# Patient Record
Sex: Female | Born: 1970
Health system: Southern US, Community
[De-identification: ages and names within clinical notes are randomized; demographics above are authoritative.]

## PROBLEM LIST (undated history)

## (undated) DIAGNOSIS — N200 Calculus of kidney: Secondary | ICD-10-CM

## (undated) DIAGNOSIS — C50919 Malignant neoplasm of unspecified site of unspecified female breast: Secondary | ICD-10-CM

## (undated) DIAGNOSIS — D0590 Unspecified type of carcinoma in situ of unspecified breast: Secondary | ICD-10-CM

## (undated) DIAGNOSIS — Z923 Personal history of irradiation: Secondary | ICD-10-CM

## (undated) DIAGNOSIS — T7840XA Allergy, unspecified, initial encounter: Secondary | ICD-10-CM

## (undated) DIAGNOSIS — M533 Sacrococcygeal disorders, not elsewhere classified: Secondary | ICD-10-CM

## (undated) DIAGNOSIS — Z9889 Other specified postprocedural states: Secondary | ICD-10-CM

## (undated) DIAGNOSIS — R112 Nausea with vomiting, unspecified: Secondary | ICD-10-CM

## (undated) HISTORY — PX: MOUTH SURGERY: SHX715

## (undated) HISTORY — PX: BREAST ENHANCEMENT SURGERY: SHX7

## (undated) HISTORY — DX: Malignant neoplasm of unspecified site of unspecified female breast: C50.919

## (undated) HISTORY — PX: FOOT SURGERY: SHX648

## (undated) HISTORY — PX: BREAST SURGERY: SHX581

## (undated) HISTORY — DX: Unspecified type of carcinoma in situ of unspecified breast: D05.90

## (undated) HISTORY — DX: Sacrococcygeal disorders, not elsewhere classified: M53.3

## (undated) HISTORY — PX: ORIF ANKLE FRACTURE: SUR919

## (undated) HISTORY — PX: OTHER SURGICAL HISTORY: SHX169

## (undated) HISTORY — PX: AUGMENTATION MAMMAPLASTY: SUR837

## (undated) SURGERY — APPENDECTOMY, LAPAROSCOPIC
Anesthesia: General

---

## 2002-09-05 ENCOUNTER — Other Ambulatory Visit: Admission: RE | Admit: 2002-09-05 | Discharge: 2002-09-05 | Payer: Self-pay | Admitting: Obstetrics and Gynecology

## 2003-09-18 ENCOUNTER — Other Ambulatory Visit: Admission: RE | Admit: 2003-09-18 | Discharge: 2003-09-18 | Payer: Self-pay | Admitting: Obstetrics and Gynecology

## 2004-10-23 ENCOUNTER — Other Ambulatory Visit: Admission: RE | Admit: 2004-10-23 | Discharge: 2004-10-23 | Payer: Self-pay | Admitting: Obstetrics and Gynecology

## 2005-03-09 ENCOUNTER — Emergency Department: Payer: Self-pay | Admitting: Emergency Medicine

## 2005-05-13 ENCOUNTER — Other Ambulatory Visit: Admission: RE | Admit: 2005-05-13 | Discharge: 2005-05-13 | Payer: Self-pay | Admitting: Obstetrics and Gynecology

## 2006-11-09 ENCOUNTER — Encounter: Admission: RE | Admit: 2006-11-09 | Discharge: 2006-11-09 | Payer: Self-pay | Admitting: Obstetrics and Gynecology

## 2007-04-07 ENCOUNTER — Observation Stay: Payer: Self-pay | Admitting: Unknown Physician Specialty

## 2007-07-21 ENCOUNTER — Encounter: Admission: RE | Admit: 2007-07-21 | Discharge: 2007-07-21 | Payer: Self-pay | Admitting: Obstetrics and Gynecology

## 2008-02-16 DIAGNOSIS — J309 Allergic rhinitis, unspecified: Secondary | ICD-10-CM | POA: Insufficient documentation

## 2008-12-26 ENCOUNTER — Inpatient Hospital Stay (HOSPITAL_COMMUNITY): Admission: AD | Admit: 2008-12-26 | Discharge: 2008-12-31 | Payer: Self-pay | Admitting: Obstetrics and Gynecology

## 2009-01-01 ENCOUNTER — Inpatient Hospital Stay: Payer: Self-pay | Admitting: Obstetrics and Gynecology

## 2009-01-31 ENCOUNTER — Ambulatory Visit (HOSPITAL_COMMUNITY): Admission: RE | Admit: 2009-01-31 | Discharge: 2009-01-31 | Payer: Self-pay | Admitting: Obstetrics and Gynecology

## 2009-10-01 ENCOUNTER — Ambulatory Visit (HOSPITAL_COMMUNITY): Admission: RE | Admit: 2009-10-01 | Discharge: 2009-10-01 | Payer: Self-pay | Admitting: Obstetrics and Gynecology

## 2011-03-10 LAB — CBC
HCT: 31.2 % — ABNORMAL LOW (ref 36.0–46.0)
HCT: 34.5 % — ABNORMAL LOW (ref 36.0–46.0)
Hemoglobin: 10.7 g/dL — ABNORMAL LOW (ref 12.0–15.0)
Hemoglobin: 11.7 g/dL — ABNORMAL LOW (ref 12.0–15.0)
MCHC: 33.9 g/dL (ref 30.0–36.0)
MCHC: 34.3 g/dL (ref 30.0–36.0)
MCV: 91.3 fL (ref 78.0–100.0)
Platelets: 247 10*3/uL (ref 150–400)
RBC: 3.38 MIL/uL — ABNORMAL LOW (ref 3.87–5.11)
RDW: 13.4 % (ref 11.5–15.5)
RDW: 13.7 % (ref 11.5–15.5)
RDW: 13.9 % (ref 11.5–15.5)

## 2011-03-10 LAB — MAGNESIUM: Magnesium: 6.4 mg/dL (ref 1.5–2.5)

## 2011-04-07 NOTE — Letter (Signed)
January 31, 2009    Duke Salvia. Marcelle Overlie, MD  [address] [place], [place] [address]   RE:   TALI, CLEAVES  MR#   [id number]  ACC#        [id number]   MFM CONSULTATION REPORT   Dear Dr. Marcelle Overlie:   I had the opportunity to see your patient, Kaylee Benson for a consultation  secondary to her history of a recent loss of a pregnancy at 22 weeks in  which she delivered at home.  As you know, Kaylee Benson is a 40 year old  gravida 4, para 2-1-1-2 Caucasian female who had a history of two  previous term pregnancies without complications prior to her recent  pregnancy loss at [redacted] weeks gestation.  Specifically, her past OB history  is the following.  In 1994, she had a vacuum-assisted vaginal delivery  at full term without complications.  In 1997, she had a spontaneous  vaginal delivery at full term, again without complications.  The father  of the baby of those pregnancies is different than the current father of  this most recent pregnancy.  The patient did not tell me about her third  pregnancy, but according to your records, she must have had an early  miscarriage or a pregnancy termination.  Fourth pregnancy was a  pregnancy which was ended at [redacted] weeks gestation.   PAST MEDICAL HISTORY:  Unremarkable.   PAST SURGICAL HISTORY:  Significant for a broken leg approximately 2  years ago which needed orthopedic surgery.   CURRENT MEDICATIONS:  Prenatal vitamins, calcium.   ALLERGIES:  None.   PHYSICAL EXAMINATION:  VITAL SIGNS:  Blood pressure 116/66, pulse was  76, weight 138.   HISTORY OF MOST RECENT PREGNANCY:  The patient relates that the most  recent pregnancy was uncomplicated of through approximately January 19.  At that time, she was noted to have small amount of spotting and was  seen in your office for an ultrasound which revealed no significant  problems.  She continued to have some vaginal bleeding; therefore, was  seen for several followup appointments one time of which  there was noted  to be evidence of a subchorionic hematoma on the ultrasound examination.  The patient was admitted to [facility] from February 3 to February  8 secondary to vaginal bleeding and some uterine contractions.  Uterine  contractions were treated with subcutaneous terbutaline and also with IV  magnesium sulfate with good resolution of her contractions.  She was  discharged home to bedrest on December 31, 2008, at 22-4/7th weeks  gestation.  She also was given a prescription for Procardia to take 10  mg every 6 hours.  The patient reports after having gone home and being  at bedrest that she had a fairly restless night with crampy abdominal  pain throughout the night.  She woke at approximately 5 a.m. and the  following morning (January 01, 2009) at which time she noted gross  rupture of membranes.  She subsequently got up to go to the bathroom and  with minimal amount of pain or cramping spontaneously delivered the baby  while in the toilet.  She reports that the baby was live born and had  spontaneous movement and showed signs of life at the time of delivery.  She also noted that she quickly past the placenta without complications  shortly after delivering the baby.  She was taken then to [facility] where she was evaluated for the home birth  at [redacted]  weeks gestation.  Placental pathology which was obtained at [facility] revealed acute chorioamnionitis and deciduitis.  There was no  appendicitis seen.  In addition to the placental pathology, she has also  undergone a saline ultrasound to rule out structural defects in your  office.  This apparently was normal except for showing some buildup  posteriorly of endometrial tissue and has had some irregular bleeding  since the delivery.  As I understand the plan is to repeat the saline  ultrasound in the future and possibly to do a D&C if needed.   Finally, she had a thrombophilia workup which  revealed the following:  Lupus anticoagulant was not detectable, beta 2 glycoprotein was normal,  anticardiolipin IgG was 13 which is in the indeterminate range,  anticardiolipin IgM antibody was 28, which is in the low radium positive  range, anticardiolipin IgA antibody was 22, which was in the positive  range.  Protein C and S were both normal.  She tested negative for the  prothrombin, to gene mutation, and for Leiden factor V mutation.   In summary, I believe her preterm delivery was most likely secondary to  a chronic abruption picture.  The thrombophilia workup including the  antiphospholipid antibody workup would not be consistent with  antiphospholipid syndrome, and her clinical picture is not really  consistent with antiphospholipid syndrome in that.  On last ultrasound,  fetal growth was normal and the baby was a live born infant of 22 weeks  and was not a 22-week demise, but in contrast was a live born delivery  at 69 weeks with neonatal demise.  In addition, the evidence of  chorioamnionitis points to a preterm labor/chronic abruption picture as  the cause of her delivery at [redacted] weeks gestation.  It is of note;  however, is that by report, she had minimal cramping overnight and no  significant laboring symptoms prior to delivery of the baby which could  point to an incompetent cervix picture.  I did review her ultrasound  examinations which were in the eyesight system and did not see any  evidence of incompetent cervix on her ultrasound pictures obtained prior  to delivery.  This however does not necessarily exclude some incompetent  cervix component to her delivery.  I discussed all of these opinions  with [facility] and her husband at the time of her consultation.   Based on the above, I would recommend the following for future  pregnancies:  1. Serial ultrasounds for evaluation of cervical length beginning at      approximately 14-15 weeks and conducted weekly until  approximately      [redacted] weeks gestation.  If there is in fact evidence of significant      funneling at the level of the internal os, I would recommend a      cerclage based upon her history which was reported.  2. Based upon her laboratory criteria, I would not treat her with      aspirin and heparin in any future pregnancies.  3. Based upon her preterm labor presentation, I would however offer      her 17-hydroxyprogesterone therapy in any future pregnancies      secondary to her history of a preterm delivery at [redacted] weeks      gestation.   I discussed with her that the recurrence risk in subsequent pregnancies  I believe would be quite low; however, in cases of abruption would  probably be quite well.  I discussed the above management strategies  with her and she seem to be amenable with those suggestions.  If you  have any comments or wish to discuss her case further with me, please  feel free to contact me.  If she decides to have another pregnancy and  you would like [place] to co-manager her pregnancy, we will be more than  happy to do that.   Sincerely,      Felipa Eth, MD      DCM/MEDQ  D:  01/31/2009  T:  02/01/2009  Job:  [phone] [id number]

## 2011-04-07 NOTE — Discharge Summary (Signed)
NAMELEONILDA, Kaylee Benson                  ACCOUNT NO.:  [id number]   MEDICAL RECORD NO.:  [id number]          PATIENT TYPE:  INP   LOCATION:  9151                          FACILITY:  [facility]   PHYSICIAN:  Freddy Finner, M.D.   DATE OF BIRTH:  Apr 04, 1971   DATE OF ADMISSION:  12/26/2008  DATE OF DISCHARGE:  12/31/2008                               DISCHARGE SUMMARY   ADMITTING DIAGNOSES:  1. Intrauterine pregnancy at 39 weeks' estimated gestational age.  2. Vaginal bleeding.   DISCHARGE DIAGNOSES:  1. Intrauterine pregnancy at 22-4/7th weeks' estimated gestational      age.  2. Vaginal bleeding, resolved.   REASON FOR ADMISSION:  Please see written H&P.   HOSPITAL COURSE:  The patient is 40 year old gravida 4, para 2 that was  admitted to [facility] at 22 weeks' estimated  gestational age with complaints of vaginal bleeding.  The patient had  been seen in the office on the week prior to admission with noted  placental abruption by ultrasound and had been placed on bedrest at  home.  The patient denied any loss of fluid.  Fetus was active.  The  patient had noted some increase in vaginal bleeding on the night prior  to admission and now presented to [facility] for further evaluation.  Contractions were approximately every 5 minutes on admission.  The  patient had been given subcu terbutaline with resolution of the  contractions.  The patient was now admitted for observation.  The  patient did undergo a vaginal ultrasound, which had revealed baby in the  53rd percentile weighing estimated fetal weight of 477 g.  Amniotic  fluid index was normal.  Cervix was noted to be 4.6 cm in length and  there was a 3.3-cm complex fluid collection at the internal os.  The  patient was now admitted and placed on bedrest for close observation and  repeat CBC ordered for the following morning.  Following morning, the  patient was continued to have some bleeding with contractions.  Vital  signs were  stable.  Uterus was soft, nontender.  Fetal heart tones were  positive.  The patient was started on some magnesium sulfate and CBC was  ordered.  The patient did have resolution of cramping and bleeding did  resolve and the patient was weaned off the magnesium sulfate and started  on Procardia.  On the day of discharge, ultrasound was repeated with  resolution of previous clot.  Cervix was noted to be 3.2 cm in length.  No evidence of placental abruption was noted.  The patient was  subsequently discharged home on bedrest.  The patient was instructed to  call for increase in vaginal pressure, contractions, decrease in fetal  movement, vaginal bleeding, or loss of amniotic fluid.   DISCHARGE MEDICATIONS:  1. Procardia 10 mg 1 p.o. every 6 hours.  2. Prenatal vitamins 1 p.o. daily.      Julio Sicks, N.P.      Freddy Finner, M.D.  Electronically Signed    CC/MEDQ  D:  01/18/2009  T:  01/18/2009  Job:  [id number]

## 2011-04-25 ENCOUNTER — Emergency Department: Payer: Self-pay | Admitting: Emergency Medicine

## 2011-12-01 ENCOUNTER — Observation Stay: Payer: Self-pay

## 2011-12-02 ENCOUNTER — Inpatient Hospital Stay: Payer: Self-pay | Admitting: Obstetrics & Gynecology

## 2011-12-02 LAB — CBC WITH DIFFERENTIAL/PLATELET
Eosinophil %: 0.9 %
HCT: 35.3 % (ref 35.0–47.0)
Lymphocyte #: 1.2 10*3/uL (ref 1.0–3.6)
MCH: 31.9 pg (ref 26.0–34.0)
MCV: 95 fL (ref 80–100)
Monocyte #: 0.5 10*3/uL (ref 0.0–0.7)
Neutrophil #: 9 10*3/uL — ABNORMAL HIGH (ref 1.4–6.5)
Neutrophil %: 83 %
Platelet: 157 10*3/uL (ref 150–440)
RBC: 3.73 10*6/uL — ABNORMAL LOW (ref 3.80–5.20)
RDW: 14.2 % (ref 11.5–14.5)
WBC: 10.8 10*3/uL (ref 3.6–11.0)

## 2011-12-03 LAB — HEMATOCRIT: HCT: 31 % — ABNORMAL LOW (ref 35.0–47.0)

## 2012-06-23 ENCOUNTER — Other Ambulatory Visit (HOSPITAL_COMMUNITY): Payer: Self-pay | Admitting: Obstetrics and Gynecology

## 2012-06-23 DIAGNOSIS — N971 Female infertility of tubal origin: Secondary | ICD-10-CM

## 2012-07-12 ENCOUNTER — Ambulatory Visit (HOSPITAL_COMMUNITY)
Admission: RE | Admit: 2012-07-12 | Discharge: 2012-07-12 | Disposition: A | Discharge status: Home / Self Care | Payer: BC Managed Care – PPO | Source: Ambulatory Visit | Attending: Obstetrics and Gynecology | Admitting: Obstetrics and Gynecology

## 2012-07-12 DIAGNOSIS — N971 Female infertility of tubal origin: Secondary | ICD-10-CM

## 2012-07-12 DIAGNOSIS — Z3049 Encounter for surveillance of other contraceptives: Secondary | ICD-10-CM | POA: Insufficient documentation

## 2012-07-12 MED ORDER — IOHEXOL 300 MG/ML  SOLN: 4.0000 mL | Freq: Once | INTRAMUSCULAR | Status: AC | PRN

## 2014-11-14 ENCOUNTER — Other Ambulatory Visit: Payer: Self-pay | Admitting: Obstetrics and Gynecology

## 2014-11-14 DIAGNOSIS — R928 Other abnormal and inconclusive findings on diagnostic imaging of breast: Secondary | ICD-10-CM

## 2014-11-22 ENCOUNTER — Ambulatory Visit
Admission: RE | Admit: 2014-11-22 | Discharge: 2014-11-22 | Disposition: A | Discharge status: Home / Self Care | Payer: BC Managed Care – PPO | Source: Ambulatory Visit | Attending: Obstetrics and Gynecology | Admitting: Obstetrics and Gynecology

## 2014-11-22 ENCOUNTER — Other Ambulatory Visit: Payer: Self-pay | Admitting: Obstetrics and Gynecology

## 2014-11-22 DIAGNOSIS — R928 Other abnormal and inconclusive findings on diagnostic imaging of breast: Secondary | ICD-10-CM

## 2014-11-23 DIAGNOSIS — Z923 Personal history of irradiation: Secondary | ICD-10-CM

## 2014-11-23 HISTORY — DX: Personal history of irradiation: Z92.3

## 2014-11-23 HISTORY — PX: BREAST LUMPECTOMY: SHX2

## 2014-11-23 HISTORY — PX: BREAST BIOPSY: SHX20

## 2014-11-29 ENCOUNTER — Other Ambulatory Visit: Payer: Self-pay | Admitting: Obstetrics and Gynecology

## 2014-11-29 DIAGNOSIS — R928 Other abnormal and inconclusive findings on diagnostic imaging of breast: Secondary | ICD-10-CM

## 2014-11-30 ENCOUNTER — Other Ambulatory Visit: Payer: BC Managed Care – PPO

## 2014-12-05 ENCOUNTER — Ambulatory Visit
Admission: RE | Admit: 2014-12-05 | Discharge: 2014-12-05 | Disposition: A | Discharge status: Home / Self Care | Payer: 59 | Source: Ambulatory Visit | Attending: Obstetrics and Gynecology | Admitting: Obstetrics and Gynecology

## 2014-12-05 DIAGNOSIS — R928 Other abnormal and inconclusive findings on diagnostic imaging of breast: Secondary | ICD-10-CM

## 2014-12-07 ENCOUNTER — Other Ambulatory Visit: Payer: Self-pay | Admitting: Obstetrics and Gynecology

## 2014-12-07 DIAGNOSIS — N63 Unspecified lump in unspecified breast: Secondary | ICD-10-CM

## 2014-12-11 ENCOUNTER — Ambulatory Visit
Admission: RE | Admit: 2014-12-11 | Discharge: 2014-12-11 | Disposition: A | Discharge status: Home / Self Care | Payer: 59 | Source: Ambulatory Visit | Attending: Obstetrics and Gynecology | Admitting: Obstetrics and Gynecology

## 2014-12-11 ENCOUNTER — Other Ambulatory Visit: Payer: Self-pay | Admitting: Obstetrics and Gynecology

## 2014-12-11 DIAGNOSIS — T8543XA Leakage of breast prosthesis and implant, initial encounter: Secondary | ICD-10-CM

## 2014-12-13 ENCOUNTER — Other Ambulatory Visit: Payer: Self-pay | Admitting: Obstetrics and Gynecology

## 2014-12-13 DIAGNOSIS — N63 Unspecified lump in unspecified breast: Secondary | ICD-10-CM

## 2014-12-21 ENCOUNTER — Ambulatory Visit
Admission: RE | Admit: 2014-12-21 | Discharge: 2014-12-21 | Disposition: A | Discharge status: Home / Self Care | Payer: 59 | Source: Ambulatory Visit | Attending: Obstetrics and Gynecology | Admitting: Obstetrics and Gynecology

## 2014-12-21 DIAGNOSIS — N63 Unspecified lump in unspecified breast: Secondary | ICD-10-CM

## 2014-12-24 ENCOUNTER — Ambulatory Visit (INDEPENDENT_AMBULATORY_CARE_PROVIDER_SITE_OTHER): Payer: Self-pay | Admitting: Surgery

## 2014-12-24 NOTE — H&P (Signed)
Kaylee Benson 12/24/2014 2:25 PM Location: [facility] Patient #: [id number] DOB: Sep 17, 1971 Married / Language: English / Race: White Female History of Present Illness Kaylee Moores A. Deyci Gesell MD; 12/24/2014 3:16 PM) Patient words: Right Breast consult  Pt sent at the request of Dr [name] for right breast mass core bx shown to be ADH. Pt has implants and right sided implant ruptured. This will need to be replaced by Dr [name]. Denies any mass or pain prior to this. Picked up on screening mammogram. CLINICAL DATA: 44 year old female, callback from screening mammogram for possible right breast mass EXAM: DIGITAL DIAGNOSTIC RIGHT MAMMOGRAM ULTRASOUND RIGHT BREAST COMPARISON: 11/08/2014, 09/04/2013, additional prior imaging dating back to 12/26/2010 ACR Breast Density Category c: The breast tissue is heterogeneously dense, which may obscure small masses. FINDINGS: The possible mass seen on the MLO view only of the screening mammogram is not well seen on today's MLO spot-compression view. On the tomosynthesis MLO view from the screening mammogram, this mass appeared to be within the lateral breast. On today's spot compression CC view, an oval low-density mass with obscured margins is seen within the lateral right breast, likely corresponding to the abnormality seen on the MLO view of the screening mammogram. On physical exam, no discrete mass is felt within the area of concern in the lateral right breast. Targeted ultrasound of the lateral right breast demonstrates a circumscribed oval hypoechoic mass measuring 4 x 2 x 5 mm at 9 o'clock, 4 cm from the nipple. A small amount of internal vascularity is noted within this probable complicated cyst. IMPRESSION: Indeterminate right breast mass. RECOMMENDATION: Ultrasound-guided right breast biopsy. I have discussed the findings and recommendations with the patient. Results were also provided in writing at the conclusion of the visit. If  applicable, a reminder letter will be sent to the patient regarding the next appointment. BI-RADS CATEGORY 4: Suspicious. Electronically Signed By: Pamelia Hoit M.D. On: 11/22/2014 13:01   External Result Report         Breast, left, needle core biopsy, mass, 9 o'clock - ATYPICAL DUCTAL HYPERPLASIA. - SEE COMMENT. Microscopic Comment.  The patient is a 44 year old female   Other Problems ([name], RN, BSN; 12/24/2014 2:25 PM) Back Pain Kidney Edythe Clarity In Breast  Past Surgical History [name], RN, BSN; 12/24/2014 2:25 PM) Breast Augmentation Bilateral. Breast Biopsy Right. Foot Surgery Left. Oral Surgery  Diagnostic Studies History (Crystal Goodhue, RN, BSN; 12/24/2014 2:25 PM) Colonoscopy never Mammogram within last year Pap Smear 1-5 years ago  Allergies [name], RN, BSN; 12/24/2014 2:26 PM) Seasonal IC *ASSORTED CLASSES*  Medication History [name], RN, BSN; 12/24/2014 2:27 PM) Calcium Citrate (200MG  Tablet, 1 (one) Oral daily) Active. Multiple Vitamin (1 (one) Oral daily) Active.  Social History Multimedia programmer, RN, BSN; 12/24/2014 2:25 PM) Alcohol use Occasional alcohol use. Caffeine use Coffee, Tea. No drug use Tobacco use Never smoker.  Family History ([name], RN, BSN; 12/24/2014 2:25 PM) Breast Cancer Mother. Heart Disease Mother. Heart disease in female family member before age 40 Hypertension Mother.  Pregnancy / Birth History ([name], RN, BSN; 12/24/2014 2:25 PM) Age at menarche 18 years. Contraceptive History Oral contraceptives. Gravida 4 Maternal age 44-25 Para 3 Regular periods     Review of Systems Occupational hygienist, BSN; 12/24/2014 2:25 PM) General Not Present- Appetite Loss, Chills, Fatigue, Fever, Night Sweats, Weight Gain and Weight Loss. Skin Not Present- Change in Wart/Mole, Dryness, Hives, Jaundice, New Lesions, Non-Healing Wounds, Rash and  Ulcer. HEENT  Present- Seasonal Allergies and Wears glasses/contact lenses. Not Present- Earache, Hearing Loss, Hoarseness, Nose Bleed, Oral Ulcers, Ringing in the Ears, Sinus Pain, Sore Throat, Visual Disturbances and Yellow Eyes. Respiratory Not Present- Bloody sputum, Chronic Cough, Difficulty Breathing, Snoring and Wheezing. Breast Present- Breast Mass. Not Present- Breast Pain, Nipple Discharge and Skin Changes. Cardiovascular Not Present- Chest Pain, Difficulty Breathing Lying Down, Leg Cramps, Palpitations, Rapid Heart Rate, Shortness of Breath and Swelling of Extremities. Gastrointestinal Not Present- Abdominal Pain, Bloating, Bloody Stool, Change in Bowel Habits, Chronic diarrhea, Constipation, Difficulty Swallowing, Excessive gas, Gets full quickly at meals, Hemorrhoids, Indigestion, Nausea, Rectal Pain and Vomiting. Female Genitourinary Not Present- Frequency, Nocturia, Painful Urination, Pelvic Pain and Urgency. Musculoskeletal Not Present- Back Pain, Joint Pain, Joint Stiffness, Muscle Pain, Muscle Weakness and Swelling of Extremities. Neurological Not Present- Decreased Memory, Fainting, Headaches, Numbness, Seizures, Tingling, Tremor, Trouble walking and Weakness. Psychiatric Not Present- Anxiety, Bipolar, Change in Sleep Pattern, Depression, Fearful and Frequent crying. Endocrine Not Present- Cold Intolerance, Excessive Hunger, Hair Changes, Heat Intolerance, Hot flashes and New Diabetes. Hematology Not Present- Easy Bruising, Excessive bleeding, Gland problems, HIV and Persistent Infections.  Vitals Occupational hygienist, BSN; 12/24/2014 2:29 PM) 12/24/2014 2:27 PM Weight: 124.2 lb Height: 60in Body Surface Area: 1.54 m Body Mass Index: 24.26 kg/m Temp.: 98.61F(Oral)  Pulse: 78 (Regular)  Resp.: 20 (Unlabored)  BP: 100/70 (Sitting, Left Arm, Standard)     Physical Exam (Tayllor Breitenstein A. Anyla Israelson MD; 12/24/2014 3:16 PM)  General Mental Status-Alert. General  Appearance-Consistent with stated age. Hydration-Well hydrated. Voice-Normal.  Head and Neck Head-normocephalic, atraumatic with no lesions or palpable masses. Trachea-midline. Thyroid Gland Characteristics - normal size and consistency.  Eye Eyeball - Bilateral-Extraocular movements intact. Sclera/Conjunctiva - Bilateral-No scleral icterus.  Chest and Lung Exam Chest and lung exam reveals -quiet, even and easy respiratory effort with no use of accessory muscles and on auscultation, normal breath sounds, no adventitious sounds and normal vocal resonance. Inspection Chest Wall - Normal. Back - normal.  Breast Breast - Left-Symmetric, Non Tender, No Biopsy scars, no Dimpling, No Inflammation, No Lumpectomy scars, No Mastectomy scars, No Peau d' Orange. Breast - Right-Symmetric, Non Tender, No Biopsy scars, no Dimpling, No Inflammation, No Lumpectomy scars, No Mastectomy scars, No Peau d' Orange. Breast Lump-No Palpable Breast Mass. Note: implants in place. mild bruising on the right   Cardiovascular Cardiovascular examination reveals -normal heart sounds, regular rate and rhythm with no murmurs and normal pedal pulses bilaterally.  Abdomen Inspection Inspection of the abdomen reveals - No Hernias. Skin - Scar - no surgical scars. Palpation/Percussion Palpation and Percussion of the abdomen reveal - Soft, Non Tender, No Rebound tenderness, No Rigidity (guarding) and No hepatosplenomegaly. Auscultation Auscultation of the abdomen reveals - Bowel sounds normal.  Neurologic Neurologic evaluation reveals -alert and oriented x 3 with no impairment of recent or remote memory. Mental Status-Normal.  Musculoskeletal Normal Exam - Left-Upper Extremity Strength Normal and Lower Extremity Strength Normal. Normal Exam - Right-Upper Extremity Strength Normal and Lower Extremity Strength Normal.  Lymphatic Head & Neck  General Head & Neck Lymphatics:  Bilateral - Description - Normal. Axillary  General Axillary Region: Bilateral - Description - Normal. Tenderness - Non Tender. Femoral & Inguinal  Generalized Femoral & Inguinal Lymphatics: Bilateral - Description - Normal. Tenderness - Non Tender.    Assessment & Plan (Mackinley Cassaday A. Sparsh Callens MD; 12/24/2014 3:18 PM)  ATYPICAL DUCTAL HYPERPLASIA OF RIGHT BREAST (610.8  N60.91) Impression: recommend lumpectomy on right Risk of lumpectomy include bleeding, infection,  seroma, more surgery, use of seed/wire, wound care, cosmetic deformity and the need for other treatments, death , blood clots, death. Pt agrees to proceed. with seed or wire depending where Dr [name] works.  Current Plans Pt Education - [facility] Breast Biopsy [facility] BREAST IMPLANT LEAK (996.54  T85.43XA) Impression: per Dr [name] try to coordinate lumpectomy and implant exchange

## 2015-01-04 ENCOUNTER — Ambulatory Visit (INDEPENDENT_AMBULATORY_CARE_PROVIDER_SITE_OTHER): Payer: Self-pay | Admitting: Surgery

## 2015-01-04 DIAGNOSIS — N6310 Unspecified lump in the right breast, unspecified quadrant: Secondary | ICD-10-CM

## 2015-01-08 ENCOUNTER — Other Ambulatory Visit (INDEPENDENT_AMBULATORY_CARE_PROVIDER_SITE_OTHER): Payer: Self-pay | Admitting: Surgery

## 2015-01-08 DIAGNOSIS — N6310 Unspecified lump in the right breast, unspecified quadrant: Secondary | ICD-10-CM

## 2015-01-25 ENCOUNTER — Encounter (HOSPITAL_BASED_OUTPATIENT_CLINIC_OR_DEPARTMENT_OTHER): Payer: Self-pay | Admitting: *Deleted

## 2015-01-25 NOTE — Progress Notes (Signed)
Pt coming in Monday for CMET, CBC DIFF.

## 2015-01-28 ENCOUNTER — Encounter (HOSPITAL_BASED_OUTPATIENT_CLINIC_OR_DEPARTMENT_OTHER)
Admission: RE | Admit: 2015-01-28 | Discharge: 2015-01-28 | Disposition: A | Discharge status: Home / Self Care | Payer: 59 | Source: Ambulatory Visit | Attending: Surgery | Admitting: Surgery

## 2015-01-28 DIAGNOSIS — N6011 Diffuse cystic mastopathy of right breast: Secondary | ICD-10-CM | POA: Diagnosis not present

## 2015-01-28 DIAGNOSIS — Z9882 Breast implant status: Secondary | ICD-10-CM | POA: Diagnosis not present

## 2015-01-28 DIAGNOSIS — Z87442 Personal history of urinary calculi: Secondary | ICD-10-CM | POA: Diagnosis not present

## 2015-01-28 DIAGNOSIS — D0511 Intraductal carcinoma in situ of right breast: Secondary | ICD-10-CM | POA: Diagnosis not present

## 2015-01-28 DIAGNOSIS — N63 Unspecified lump in breast: Secondary | ICD-10-CM | POA: Diagnosis present

## 2015-01-28 DIAGNOSIS — Z803 Family history of malignant neoplasm of breast: Secondary | ICD-10-CM | POA: Diagnosis not present

## 2015-01-28 LAB — COMPREHENSIVE METABOLIC PANEL
ALK PHOS: 50 U/L (ref 39–117)
ALT: 17 U/L (ref 0–35)
AST: 26 U/L (ref 0–37)
Albumin: 4.1 g/dL (ref 3.5–5.2)
Anion gap: 6 (ref 5–15)
BILIRUBIN TOTAL: 0.4 mg/dL (ref 0.3–1.2)
BUN: 19 mg/dL (ref 6–23)
CO2: 27 mmol/L (ref 19–32)
CREATININE: 0.78 mg/dL (ref 0.50–1.10)
Calcium: 9.3 mg/dL (ref 8.4–10.5)
Chloride: 104 mmol/L (ref 96–112)
Glucose, Bld: 67 mg/dL — ABNORMAL LOW (ref 70–99)
Potassium: 3.8 mmol/L (ref 3.5–5.1)
Sodium: 137 mmol/L (ref 135–145)
Total Protein: 7.2 g/dL (ref 6.0–8.3)

## 2015-01-28 LAB — CBC WITH DIFFERENTIAL/PLATELET
Basophils Absolute: 0 10*3/uL (ref 0.0–0.1)
Basophils Relative: 0 % (ref 0–1)
Eosinophils Absolute: 0.1 10*3/uL (ref 0.0–0.7)
Eosinophils Relative: 1 % (ref 0–5)
HCT: 39.2 % (ref 36.0–46.0)
HEMOGLOBIN: 13 g/dL (ref 12.0–15.0)
LYMPHS ABS: 1.3 10*3/uL (ref 0.7–4.0)
Lymphocytes Relative: 18 % (ref 12–46)
MCH: 29.4 pg (ref 26.0–34.0)
MCHC: 33.2 g/dL (ref 30.0–36.0)
MCV: 88.7 fL (ref 78.0–100.0)
MONO ABS: 0.5 10*3/uL (ref 0.1–1.0)
MONOS PCT: 7 % (ref 3–12)
NEUTROS ABS: 5.3 10*3/uL (ref 1.7–7.7)
Neutrophils Relative %: 74 % (ref 43–77)
Platelets: 316 10*3/uL (ref 150–400)
RBC: 4.42 MIL/uL (ref 3.87–5.11)
RDW: 14 % (ref 11.5–15.5)
WBC: 7.2 10*3/uL (ref 4.0–10.5)

## 2015-01-30 ENCOUNTER — Encounter (HOSPITAL_BASED_OUTPATIENT_CLINIC_OR_DEPARTMENT_OTHER)
Admission: RE | Disposition: A | Discharge status: Home / Self Care | Payer: Self-pay | Source: Ambulatory Visit | Attending: Surgery

## 2015-01-30 ENCOUNTER — Ambulatory Visit
Admission: RE | Admit: 2015-01-30 | Discharge: 2015-01-30 | Disposition: A | Discharge status: Home / Self Care | Payer: 59 | Source: Ambulatory Visit | Attending: Surgery | Admitting: Surgery

## 2015-01-30 ENCOUNTER — Ambulatory Visit (HOSPITAL_BASED_OUTPATIENT_CLINIC_OR_DEPARTMENT_OTHER)
Admission: RE | Admit: 2015-01-30 | Discharge: 2015-01-30 | Disposition: A | Discharge status: Home / Self Care | Payer: 59 | Source: Ambulatory Visit | Attending: Surgery | Admitting: Surgery

## 2015-01-30 ENCOUNTER — Encounter (HOSPITAL_BASED_OUTPATIENT_CLINIC_OR_DEPARTMENT_OTHER): Payer: Self-pay

## 2015-01-30 ENCOUNTER — Ambulatory Visit (HOSPITAL_BASED_OUTPATIENT_CLINIC_OR_DEPARTMENT_OTHER): Payer: 59 | Admitting: Anesthesiology

## 2015-01-30 DIAGNOSIS — D0511 Intraductal carcinoma in situ of right breast: Secondary | ICD-10-CM | POA: Insufficient documentation

## 2015-01-30 DIAGNOSIS — N6310 Unspecified lump in the right breast, unspecified quadrant: Secondary | ICD-10-CM

## 2015-01-30 DIAGNOSIS — Z87442 Personal history of urinary calculi: Secondary | ICD-10-CM | POA: Insufficient documentation

## 2015-01-30 DIAGNOSIS — N6011 Diffuse cystic mastopathy of right breast: Secondary | ICD-10-CM | POA: Insufficient documentation

## 2015-01-30 DIAGNOSIS — Z9882 Breast implant status: Secondary | ICD-10-CM | POA: Insufficient documentation

## 2015-01-30 DIAGNOSIS — Z803 Family history of malignant neoplasm of breast: Secondary | ICD-10-CM | POA: Insufficient documentation

## 2015-01-30 HISTORY — DX: Calculus of kidney: N20.0

## 2015-01-30 HISTORY — PX: BREAST LUMPECTOMY WITH NEEDLE LOCALIZATION: SHX5759

## 2015-01-30 HISTORY — PX: BREAST CAPSULECTOMY WITH IMPLANT EXCHANGE: SHX5592

## 2015-01-30 HISTORY — DX: Other specified postprocedural states: Z98.890

## 2015-01-30 HISTORY — DX: Other specified postprocedural states: R11.2

## 2015-01-30 LAB — POCT HEMOGLOBIN-HEMACUE: Hemoglobin: 14.2 g/dL (ref 12.0–15.0)

## 2015-01-30 SURGERY — BREAST LUMPECTOMY WITH NEEDLE LOCALIZATION
Anesthesia: General | Site: Breast | Laterality: Right

## 2015-01-30 MED ORDER — ROCURONIUM BROMIDE 100 MG/10ML IV SOLN
INTRAVENOUS | Status: DC | PRN
  Administered 2015-01-30: 30 mg via INTRAVENOUS

## 2015-01-30 MED ORDER — LIDOCAINE HCL (CARDIAC) 20 MG/ML IV SOLN
INTRAVENOUS | Status: DC | PRN
  Administered 2015-01-30: 50 mg via INTRAVENOUS

## 2015-01-30 MED ORDER — SODIUM CHLORIDE 0.9 % IR SOLN
Status: DC | PRN
  Administered 2015-01-30: 500 mL

## 2015-01-30 MED ORDER — EPHEDRINE SULFATE 50 MG/ML IJ SOLN
INTRAMUSCULAR | Status: DC | PRN
  Administered 2015-01-30: 10 mg via INTRAVENOUS

## 2015-01-30 MED ORDER — SCOPOLAMINE 1 MG/3DAYS TD PT72
MEDICATED_PATCH | TRANSDERMAL | Status: AC
  Filled 2015-01-30: qty 1

## 2015-01-30 MED ORDER — DEXTROSE 5 % IV SOLN
3.0000 g | INTRAVENOUS | Status: AC
  Administered 2015-01-30: 2 g via INTRAVENOUS

## 2015-01-30 MED ORDER — ONDANSETRON HCL 4 MG/2ML IJ SOLN
INTRAMUSCULAR | Status: DC | PRN
  Administered 2015-01-30: 4 mg via INTRAVENOUS

## 2015-01-30 MED ORDER — NEOSTIGMINE METHYLSULFATE 10 MG/10ML IV SOLN
INTRAVENOUS | Status: DC | PRN
  Administered 2015-01-30: 3 mg via INTRAVENOUS

## 2015-01-30 MED ORDER — FENTANYL CITRATE 0.05 MG/ML IJ SOLN
INTRAMUSCULAR | Status: DC | PRN
  Administered 2015-01-30: 25 ug via INTRAVENOUS
  Administered 2015-01-30: 100 ug via INTRAVENOUS

## 2015-01-30 MED ORDER — OXYCODONE HCL 5 MG PO TABS: 5.0000 mg | ORAL_TABLET | Freq: Once | ORAL | Status: DC | PRN

## 2015-01-30 MED ORDER — HYDROMORPHONE HCL 1 MG/ML IJ SOLN: 0.2500 mg | INTRAMUSCULAR | Status: DC | PRN

## 2015-01-30 MED ORDER — PROPOFOL 10 MG/ML IV BOLUS
INTRAVENOUS | Status: DC | PRN
  Administered 2015-01-30: 200 mg via INTRAVENOUS

## 2015-01-30 MED ORDER — SCOPOLAMINE 1 MG/3DAYS TD PT72
1.0000 | MEDICATED_PATCH | TRANSDERMAL | Status: DC
  Administered 2015-01-30: 1.5 mg via TRANSDERMAL

## 2015-01-30 MED ORDER — SUCCINYLCHOLINE CHLORIDE 20 MG/ML IJ SOLN
INTRAMUSCULAR | Status: DC | PRN
  Administered 2015-01-30: 100 mg via INTRAVENOUS

## 2015-01-30 MED ORDER — OXYCODONE-ACETAMINOPHEN 5-325 MG PO TABS
1.0000 | ORAL_TABLET | ORAL | Status: DC | PRN
Start: 2015-01-30 — End: 2015-09-04

## 2015-01-30 MED ORDER — DEXAMETHASONE SODIUM PHOSPHATE 4 MG/ML IJ SOLN
INTRAMUSCULAR | Status: DC | PRN
  Administered 2015-01-30: 10 mg via INTRAVENOUS

## 2015-01-30 MED ORDER — MIDAZOLAM HCL 2 MG/2ML IJ SOLN: 1.0000 mg | INTRAMUSCULAR | Status: DC | PRN

## 2015-01-30 MED ORDER — BUPIVACAINE-EPINEPHRINE 0.25% -1:200000 IJ SOLN
INTRAMUSCULAR | Status: DC | PRN
  Administered 2015-01-30: 12.5 mL

## 2015-01-30 MED ORDER — CHLORHEXIDINE GLUCONATE 4 % EX LIQD: 1.0000 "application " | Freq: Once | CUTANEOUS | Status: DC

## 2015-01-30 MED ORDER — FENTANYL CITRATE 0.05 MG/ML IJ SOLN
INTRAMUSCULAR | Status: AC
  Filled 2015-01-30: qty 6

## 2015-01-30 MED ORDER — GLYCOPYRROLATE 0.2 MG/ML IJ SOLN
INTRAMUSCULAR | Status: DC | PRN
  Administered 2015-01-30: 0.4 mg via INTRAVENOUS

## 2015-01-30 MED ORDER — OXYCODONE HCL 5 MG/5ML PO SOLN: 5.0000 mg | Freq: Once | ORAL | Status: DC | PRN

## 2015-01-30 MED ORDER — 0.9 % SODIUM CHLORIDE (POUR BTL) OPTIME
TOPICAL | Status: DC | PRN
  Administered 2015-01-30: 375 mL

## 2015-01-30 MED ORDER — LACTATED RINGERS IV SOLN
INTRAVENOUS | Status: DC
  Administered 2015-01-30 (×2): via INTRAVENOUS

## 2015-01-30 MED ORDER — CEFAZOLIN SODIUM-DEXTROSE 2-3 GM-% IV SOLR
INTRAVENOUS | Status: AC
  Filled 2015-01-30: qty 50

## 2015-01-30 MED ORDER — PROMETHAZINE HCL 25 MG/ML IJ SOLN
6.2500 mg | INTRAMUSCULAR | Status: DC | PRN
Start: 2015-01-30 — End: 2015-01-30

## 2015-01-30 MED ORDER — FENTANYL CITRATE 0.05 MG/ML IJ SOLN: 50.0000 ug | INTRAMUSCULAR | Status: DC | PRN

## 2015-01-30 MED ORDER — MIDAZOLAM HCL 2 MG/2ML IJ SOLN
INTRAMUSCULAR | Status: AC
  Filled 2015-01-30: qty 2

## 2015-01-30 MED ORDER — DEXTROSE 5 % IV SOLN: 3.0000 g | INTRAVENOUS | Status: DC

## 2015-01-30 MED ORDER — LIDOCAINE-EPINEPHRINE (PF) 1 %-1:200000 IJ SOLN
INTRAMUSCULAR | Status: AC
  Filled 2015-01-30: qty 10

## 2015-01-30 MED ORDER — MIDAZOLAM HCL 5 MG/5ML IJ SOLN
INTRAMUSCULAR | Status: DC | PRN
  Administered 2015-01-30: 2 mg via INTRAVENOUS

## 2015-01-30 MED ORDER — BUPIVACAINE-EPINEPHRINE (PF) 0.25% -1:200000 IJ SOLN
INTRAMUSCULAR | Status: AC
  Filled 2015-01-30: qty 30

## 2015-01-30 SURGICAL SUPPLY — 92 items
APPLIER CLIP 11 MED OPEN (CLIP)
APR CLP MED 11 20 MLT OPN (CLIP)
BAG DECANTER FOR FLEXI CONT (MISCELLANEOUS) ×2 IMPLANT
BANDAGE ELASTIC 6 VELCRO ST LF (GAUZE/BANDAGES/DRESSINGS) IMPLANT
BINDER BREAST LRG (GAUZE/BANDAGES/DRESSINGS) IMPLANT
BINDER BREAST MEDIUM (GAUZE/BANDAGES/DRESSINGS) ×1 IMPLANT
BINDER BREAST XLRG (GAUZE/BANDAGES/DRESSINGS) IMPLANT
BINDER BREAST XXLRG (GAUZE/BANDAGES/DRESSINGS) IMPLANT
BIOPATCH RED 1 DISK 7.0 (GAUZE/BANDAGES/DRESSINGS) IMPLANT
BLADE SURG 15 STRL LF DISP TIS (BLADE) ×2 IMPLANT
BLADE SURG 15 STRL SS (BLADE) ×4
BNDG GAUZE ELAST 4 BULKY (GAUZE/BANDAGES/DRESSINGS) IMPLANT
CANISTER SUCT 1200ML W/VALVE (MISCELLANEOUS) ×3 IMPLANT
CHLORAPREP W/TINT 26ML (MISCELLANEOUS) ×2 IMPLANT
CLIP APPLIE 11 MED OPEN (CLIP) IMPLANT
CLIP TI WIDE RED SMALL 6 (CLIP) IMPLANT
COVER BACK TABLE 60X90IN (DRAPES) ×3 IMPLANT
COVER MAYO STAND STRL (DRAPES) ×3 IMPLANT
DECANTER SPIKE VIAL GLASS SM (MISCELLANEOUS) ×2 IMPLANT
DEVICE DUBIN W/COMP PLATE 8390 (MISCELLANEOUS) ×1 IMPLANT
DRAIN CHANNEL 19F RND (DRAIN) IMPLANT
DRAPE LAPAROSCOPIC ABDOMINAL (DRAPES) ×2 IMPLANT
DRAPE LAPAROTOMY 100X72 PEDS (DRAPES) ×1 IMPLANT
DRAPE UTILITY XL STRL (DRAPES) ×2 IMPLANT
DRSG PAD ABDOMINAL 8X10 ST (GAUZE/BANDAGES/DRESSINGS) ×2 IMPLANT
ELECT BLADE 4.0 EZ CLEAN MEGAD (MISCELLANEOUS) ×2
ELECT BLADE 6.5 .24CM SHAFT (ELECTRODE) IMPLANT
ELECT COATED BLADE 2.86 ST (ELECTRODE) ×4 IMPLANT
ELECT REM PT RETURN 9FT ADLT (ELECTROSURGICAL) ×2
ELECTRODE BLDE 4.0 EZ CLN MEGD (MISCELLANEOUS) ×1 IMPLANT
ELECTRODE REM PT RTRN 9FT ADLT (ELECTROSURGICAL) ×2 IMPLANT
EVACUATOR SILICONE 100CC (DRAIN) IMPLANT
GAUZE SPONGE 4X4 12PLY STRL (GAUZE/BANDAGES/DRESSINGS) ×3 IMPLANT
GLOVE BIO SURGEON STRL SZ 6.5 (GLOVE) ×3 IMPLANT
GLOVE BIOGEL M 7.0 STRL (GLOVE) ×1 IMPLANT
GLOVE BIOGEL PI IND STRL 7.0 (GLOVE) IMPLANT
GLOVE BIOGEL PI IND STRL 7.5 (GLOVE) IMPLANT
GLOVE BIOGEL PI IND STRL 8 (GLOVE) ×1 IMPLANT
GLOVE BIOGEL PI INDICATOR 7.0 (GLOVE) ×2
GLOVE BIOGEL PI INDICATOR 7.5 (GLOVE) ×1
GLOVE BIOGEL PI INDICATOR 8 (GLOVE) ×1
GLOVE ECLIPSE 8.0 STRL XLNG CF (GLOVE) ×3 IMPLANT
GOWN STRL REUS W/ TWL LRG LVL3 (GOWN DISPOSABLE) ×4 IMPLANT
GOWN STRL REUS W/TWL LRG LVL3 (GOWN DISPOSABLE) ×8
IMPL SALINE SMOOTH RD 325CC (Breast) IMPLANT
IMPLANT SALINE SMOOTH RD 325CC (Breast) ×2 IMPLANT
IV NS 1000ML (IV SOLUTION)
IV NS 1000ML BAXH (IV SOLUTION) IMPLANT
IV NS 500ML (IV SOLUTION) ×2
IV NS 500ML BAXH (IV SOLUTION) IMPLANT
KIT FILL SYSTEM UNIVERSAL (SET/KITS/TRAYS/PACK) ×1 IMPLANT
KIT MARKER MARGIN INK (KITS) ×1 IMPLANT
LIQUID BAND (GAUZE/BANDAGES/DRESSINGS) ×1 IMPLANT
MARKER SKIN DUAL TIP RULER LAB (MISCELLANEOUS) ×1 IMPLANT
NDL HYPO 25X1 1.5 SAFETY (NEEDLE) ×1 IMPLANT
NDL SAFETY ECLIPSE 18X1.5 (NEEDLE) IMPLANT
NDL SPNL 22GX3.5 QUINCKE BK (NEEDLE) ×1 IMPLANT
NEEDLE HYPO 18GX1.5 SHARP (NEEDLE)
NEEDLE HYPO 25X1 1.5 SAFETY (NEEDLE) ×2 IMPLANT
NEEDLE SPNL 22GX3.5 QUINCKE BK (NEEDLE) ×2 IMPLANT
NS IRRIG 1000ML POUR BTL (IV SOLUTION) ×3 IMPLANT
PACK BASIN DAY SURGERY FS (CUSTOM PROCEDURE TRAY) ×3 IMPLANT
PACK UNIVERSAL I (CUSTOM PROCEDURE TRAY) IMPLANT
PENCIL BUTTON HOLSTER BLD 10FT (ELECTRODE) ×3 IMPLANT
PIN SAFETY STERILE (MISCELLANEOUS) IMPLANT
SHEET MEDIUM DRAPE 40X70 STRL (DRAPES) IMPLANT
SLEEVE SCD COMPRESS KNEE MED (MISCELLANEOUS) ×3 IMPLANT
SPONGE LAP 18X18 X RAY DECT (DISPOSABLE) IMPLANT
SPONGE LAP 4X18 X RAY DECT (DISPOSABLE) ×2 IMPLANT
STAPLER VISISTAT 35W (STAPLE) IMPLANT
STRIP CLOSURE SKIN 1/2X4 (GAUZE/BANDAGES/DRESSINGS) ×2 IMPLANT
SUT ETHILON 2 0 FS 18 (SUTURE) IMPLANT
SUT ETHILON 3 0 PS 1 (SUTURE) IMPLANT
SUT MON AB 4-0 PC3 18 (SUTURE) ×2 IMPLANT
SUT SILK 2 0 SH (SUTURE) IMPLANT
SUT VIC AB 3-0 FS2 27 (SUTURE) IMPLANT
SUT VIC AB 3-0 SH 27 (SUTURE) ×4
SUT VIC AB 3-0 SH 27X BRD (SUTURE) ×1 IMPLANT
SUT VIC AB 5-0 P-3 18X BRD (SUTURE) ×1 IMPLANT
SUT VIC AB 5-0 P3 18 (SUTURE) ×2
SUT VICRYL 4-0 PS2 18IN ABS (SUTURE) IMPLANT
SYR 50ML LL SCALE MARK (SYRINGE) ×2 IMPLANT
SYR BULB IRRIGATION 50ML (SYRINGE) ×2 IMPLANT
SYR CONTROL 10ML LL (SYRINGE) ×3 IMPLANT
TAPE HYPAFIX 6X30 (GAUZE/BANDAGES/DRESSINGS) IMPLANT
TOWEL OR 17X24 6PK STRL BLUE (TOWEL DISPOSABLE) ×4 IMPLANT
TOWEL OR NON WOVEN STRL DISP B (DISPOSABLE) ×1 IMPLANT
TRAY DSU PREP LF (CUSTOM PROCEDURE TRAY) ×1 IMPLANT
TUBE CONNECTING 20X1/4 (TUBING) ×3 IMPLANT
UNDERPAD 30X30 INCONTINENT (UNDERPADS AND DIAPERS) ×4 IMPLANT
VAC PENCILS W/TUBING CLEAR (MISCELLANEOUS) ×1 IMPLANT
YANKAUER SUCT BULB TIP NO VENT (SUCTIONS) ×3 IMPLANT

## 2015-01-30 NOTE — Anesthesia Preprocedure Evaluation (Addendum)
Anesthesia Evaluation  Patient identified by MRN, date of birth, ID band Patient awake    Reviewed: Allergy & Precautions, H&P , NPO status , Patient's Chart, lab work & pertinent test results  History of Anesthesia Complications (+) PONV and history of anesthetic complications  Airway        Dental   Pulmonary neg pulmonary ROS, asthma ,          Cardiovascular negative cardio ROS  Rate:Normal     Neuro/Psych negative neurological ROS  negative psych ROS   GI/Hepatic negative GI ROS, Neg liver ROS,   Endo/Other  negative endocrine ROS  Renal/GU negative Renal ROS     Musculoskeletal   Abdominal   Peds  Hematology   Anesthesia Other Findings   Reproductive/Obstetrics negative OB ROS                            Anesthesia Physical Anesthesia Plan  ASA: I  Anesthesia Plan: General LMA and General ETT   Post-op Pain Management:    Induction:   Airway Management Planned:   Additional Equipment:   Intra-op Plan:   Post-operative Plan:   Informed Consent: I have reviewed the patients History and Physical, chart, labs and discussed the procedure including the risks, benefits and alternatives for the proposed anesthesia with the patient or authorized representative who has indicated his/her understanding and acceptance.   Dental Advisory Given  Plan Discussed with: Anesthesiologist, CRNA and Surgeon  Anesthesia Plan Comments:        Anesthesia Quick Evaluation

## 2015-01-30 NOTE — Interval H&P Note (Signed)
History and Physical Interval Note:  01/30/2015 10:03 AM  Kaylee Benson  has presented today for surgery, with the diagnosis of Right Breast Mass  The various methods of treatment have been discussed with the patient and family. After consideration of risks, benefits and other options for treatment, the patient has consented to  Procedure(s): BREAST LUMPECTOMY WITH NEEDLE LOCALIZATION (Right) BREAST CAPSULECTOMY WITH IMPLANT EXCHANGE (Right) as a surgical intervention .  The patient's history has been reviewed, patient examined, no change in status, stable for surgery.  I have reviewed the patient's chart and labs.  Questions were answered to the patient's satisfaction.     Juliocesar Blasius A.

## 2015-01-30 NOTE — H&P (Signed)
H&P   Kaylee Benson (MR# [id number])      H&P Info    Author Note Status Last Update User Last Update Date/Time   Erroll Luna, MD Signed Erroll Luna, MD 12/24/2014 3:18 PM    H&P    Expand All Collapse All   Kaylee Benson 12/24/2014 2:25 PM Location: [facility] Patient #: [id number] DOB: 12-Oct-1971 Married / Language: Kaylee Benson / Race: White Female History of Present Illness [name] A. Merlin Golden MD; 12/24/2014 3:16 PM) Patient words: Right Breast consult  Pt sent at the request of Dr [name] for right breast mass core bx shown to be ADH. Pt has implants and right sided implant ruptured. This will need to be replaced by Dr [name]. Denies any mass or pain prior to this. Picked up on screening mammogram. CLINICAL DATA: 44 year old female, callback from screening mammogram for possible right breast mass EXAM: DIGITAL DIAGNOSTIC RIGHT MAMMOGRAM ULTRASOUND RIGHT BREAST COMPARISON: 11/08/2014, 09/04/2013, additional prior imaging dating back to 12/26/2010 ACR Breast Density Category c: The breast tissue is heterogeneously dense, which may obscure small masses. FINDINGS: The possible mass seen on the MLO view only of the screening mammogram is not well seen on today's MLO spot-compression view. On the tomosynthesis MLO view from the screening mammogram, this mass appeared to be within the lateral breast. On today's spot compression CC view, an oval low-density mass with obscured margins is seen within the lateral right breast, likely corresponding to the abnormality seen on the MLO view of the screening mammogram. On physical exam, no discrete mass is felt within the area of concern in the lateral right breast. Targeted ultrasound of the lateral right breast demonstrates a circumscribed oval hypoechoic mass measuring 4 x 2 x 5 mm at 9 o'clock, 4 cm from the nipple. A small amount of internal vascularity is noted within this probable complicated cyst. IMPRESSION: Indeterminate  right breast mass. RECOMMENDATION: Ultrasound-guided right breast biopsy. I have discussed the findings and recommendations with the patient. Results were also provided in writing at the conclusion of the visit. If applicable, a reminder letter will be sent to the patient regarding the next appointment. BI-RADS CATEGORY 4: Suspicious. Electronically Signed By: Pamelia Hoit M.D. On: 11/22/2014 13:01   External Result Report         Breast, left, needle core biopsy, mass, 9 o'clock - ATYPICAL DUCTAL HYPERPLASIA. - SEE COMMENT. Microscopic Comment.  The patient is a 44 year old female   Other Problems ([name], RN, BSN; 12/24/2014 2:25 PM) Back Pain Kidney Edythe Clarity In Breast  Past Surgical History [name], RN, BSN; 12/24/2014 2:25 PM) Breast Augmentation Bilateral. Breast Biopsy Right. Foot Surgery Left. Oral Surgery  Diagnostic Studies History ([name], RN, BSN; 12/24/2014 2:25 PM) Colonoscopy never Mammogram within last year Pap Smear 1-5 years ago  Allergies [name], RN, BSN; 12/24/2014 2:26 PM) Seasonal IC *ASSORTED CLASSES*  Medication History [name], RN, BSN; 12/24/2014 2:27 PM) Calcium Citrate (200MG  Tablet, 1 (one) Oral daily) Active. Multiple Vitamin (1 (one) Oral daily) Active.  Social History Multimedia programmer, RN, BSN; 12/24/2014 2:25 PM) Alcohol use Occasional alcohol use. Caffeine use Coffee, Tea. No drug use Tobacco use Never smoker.  Family History ([name], RN, BSN; 12/24/2014 2:25 PM) Breast Cancer Mother. Heart Disease Mother. Heart disease in female family member before age 66 Hypertension Mother.  Pregnancy / Birth History ([name], RN, BSN; 12/24/2014 2:25 PM) Age at menarche 50 years. Contraceptive History Oral contraceptives. Gravida 4 Maternal age  21-25 Para 3 Regular periods     Review of Systems Occupational hygienist, BSN; 12/24/2014 2:25  PM) General Not Present- Appetite Loss, Chills, Fatigue, Fever, Night Sweats, Weight Gain and Weight Loss. Skin Not Present- Change in Wart/Mole, Dryness, Hives, Jaundice, New Lesions, Non-Healing Wounds, Rash and Ulcer. HEENT Present- Seasonal Allergies and Wears glasses/contact lenses. Not Present- Earache, Hearing Loss, Hoarseness, Nose Bleed, Oral Ulcers, Ringing in the Ears, Sinus Pain, Sore Throat, Visual Disturbances and Yellow Eyes. Respiratory Not Present- Bloody sputum, Chronic Cough, Difficulty Breathing, Snoring and Wheezing. Breast Present- Breast Mass. Not Present- Breast Pain, Nipple Discharge and Skin Changes. Cardiovascular Not Present- Chest Pain, Difficulty Breathing Lying Down, Leg Cramps, Palpitations, Rapid Heart Rate, Shortness of Breath and Swelling of Extremities. Gastrointestinal Not Present- Abdominal Pain, Bloating, Bloody Stool, Change in Bowel Habits, Chronic diarrhea, Constipation, Difficulty Swallowing, Excessive gas, Gets full quickly at meals, Hemorrhoids, Indigestion, Nausea, Rectal Pain and Vomiting. Female Genitourinary Not Present- Frequency, Nocturia, Painful Urination, Pelvic Pain and Urgency. Musculoskeletal Not Present- Back Pain, Joint Pain, Joint Stiffness, Muscle Pain, Muscle Weakness and Swelling of Extremities. Neurological Not Present- Decreased Memory, Fainting, Headaches, Numbness, Seizures, Tingling, Tremor, Trouble walking and Weakness. Psychiatric Not Present- Anxiety, Bipolar, Change in Sleep Pattern, Depression, Fearful and Frequent crying. Endocrine Not Present- Cold Intolerance, Excessive Hunger, Hair Changes, Heat Intolerance, Hot flashes and New Diabetes. Hematology Not Present- Easy Bruising, Excessive bleeding, Gland problems, HIV and Persistent Infections.  Vitals Occupational hygienist, BSN; 12/24/2014 2:29 PM) 12/24/2014 2:27 PM Weight: 124.2 lb Height: 60in Body Surface Area: 1.54 m Body Mass Index: 24.26 kg/m Temp.: 98.66F(Oral)   Pulse: 78 (Regular)  Resp.: 20 (Unlabored)  BP: 100/70 (Sitting, Left Arm, Standard)     Physical Exam ([name] A. Lilac Hoff MD; 12/24/2014 3:16 PM)  General Mental Status-Alert. General Appearance-Consistent with stated age. Hydration-Well hydrated. Voice-Normal.  Head and Neck Head-normocephalic, atraumatic with no lesions or palpable masses. Trachea-midline. Thyroid Gland Characteristics - normal size and consistency.  Eye Eyeball - Bilateral-Extraocular movements intact. Sclera/Conjunctiva - Bilateral-No scleral icterus.  Chest and Lung Exam Chest and lung exam reveals -quiet, even and easy respiratory effort with no use of accessory muscles and on auscultation, normal breath sounds, no adventitious sounds and normal vocal resonance. Inspection Chest Wall - Normal. Back - normal.  Breast Breast - Left-Symmetric, Non Tender, No Biopsy scars, no Dimpling, No Inflammation, No Lumpectomy scars, No Mastectomy scars, No Peau d' Orange. Breast - Right-Symmetric, Non Tender, No Biopsy scars, no Dimpling, No Inflammation, No Lumpectomy scars, No Mastectomy scars, No Peau d' Orange. Breast Lump-No Palpable Breast Mass. Note: implants in place. mild bruising on the right   Cardiovascular Cardiovascular examination reveals -normal heart sounds, regular rate and rhythm with no murmurs and normal pedal pulses bilaterally.  Abdomen Inspection Inspection of the abdomen reveals - No Hernias. Skin - Scar - no surgical scars. Palpation/Percussion Palpation and Percussion of the abdomen reveal - Soft, Non Tender, No Rebound tenderness, No Rigidity (guarding) and No hepatosplenomegaly. Auscultation Auscultation of the abdomen reveals - Bowel sounds normal.  Neurologic Neurologic evaluation reveals -alert and oriented x 3 with no impairment of recent or remote memory. Mental Status-Normal.  Musculoskeletal Normal Exam - Left-Upper Extremity  Strength Normal and Lower Extremity Strength Normal. Normal Exam - Right-Upper Extremity Strength Normal and Lower Extremity Strength Normal.  Lymphatic Head & Neck  General Head & Neck Lymphatics: Bilateral - Description - Normal. Axillary  General Axillary Region: Bilateral - Description - Normal. Tenderness - Non Tender.  Femoral & Inguinal  Generalized Femoral & Inguinal Lymphatics: Bilateral - Description - Normal. Tenderness - Non Tender.    Assessment & Plan (Hadlei Stitt A. Vicenta Olds MD; 12/24/2014 3:18 PM)  ATYPICAL DUCTAL HYPERPLASIA OF RIGHT BREAST (610.8  N60.91) Impression: recommend lumpectomy on right Risk of lumpectomy include bleeding, infection, seroma, more surgery, use of seed/wire, wound care, cosmetic deformity and the need for other treatments, death , blood clots, death. Pt agrees to proceed. with seed or wire depending where Dr [name] works.  Current Plans Pt Education - [facility] Breast Biopsy [facility] BREAST IMPLANT LEAK (996.54  T85.43XA) Impression: per Dr [name] try to coordinate lumpectomy and implant exchange

## 2015-01-30 NOTE — Op Note (Signed)
Right Breast needle localized  Lumpectomy  Indications: This patient presents with history of a right breast mass and ADH ON CORE BIOPSY. . Given the clinical history and physical exam, along with indicated diagnostic studies, breast biopsy will be performed. The procedure has been discussed with the patient. Alternatives to surgery have been discussed with the patient.  Risks of surgery include bleeding,  Infection,  Seroma formation, death,  and the need for further surgery.   The patient understands and wishes to proceed. She is also having her breast implants exchanged by Dr [name].   Pre-operative Diagnosis: right breast mass and ADH   Post-operative Diagnosis: right breast mass /ADH  Surgeon: Erroll Luna A.   Assistants: OR staff  Anesthesia: General endotracheal anesthesia and Local anesthesia 0.25.% bupivacaine, with epinephrine  ASA Class: 1  Procedure Details  The patient was seen in the Holding Room. The risks, benefits, complications, treatment options, and expected outcomes were discussed with the patient. The possibilities of reaction to medication, pulmonary aspiration, bleeding, infection, the need for additional procedures, failure to diagnose a condition, and creating a complication requiring transfusion or operation were discussed with the patient. The patient concurred with the proposed plan, giving informed consent. The site of surgery properly noted/marked. The patient was taken to Operating Room, identified as Kaylee Benson, and the procedure verified as lumpectomy. A Time Out was held and the above information confirmed.  After induction of anesthesia, both  breasts and chest were prepped and draped in standard fashion. The lumpectomy was performed by creating an oblique incision over the upper outer quadrant of the breast AROUND THE LOCALIZING WIRE. Marland Kitchen Hemostasis was achieved with cautery.  Additional tissue was taken along the superior margin and submitted separately to  pathology after providing orientation for the pathology. The wound was irrigated and closed with a 3-0 Vicryl and 4 O monocryl.  subcuticular closure in layers.  Specimen oriented. Dr [name] scrubbed into the procedure and his part will be dictated separately.     Sterile dressings were applied. At the end of the operation, all sponge, instrument, and needle counts were correct.  Findings: grossly clear surgical margins  Estimated Blood Loss:  Minimal         Drains: none         Total IV Fluids: 200 mL         Specimens: breast mass    Right with clip and wire in the specimen.          Complications:  None; patient tolerated the procedure well.         Disposition: PACU - hemodynamically stable.         Condition: Stable

## 2015-01-30 NOTE — Discharge Instructions (Signed)
[facility] °Office Phone Number [phone] ° °BREAST BIOPSY/ PARTIAL MASTECTOMY: POST OP INSTRUCTIONS ° °Always review your discharge instruction sheet given to you by the facility where your surgery was performed. ° °IF YOU HAVE DISABILITY OR FAMILY LEAVE FORMS, YOU MUST BRING THEM TO THE OFFICE FOR PROCESSING.  DO NOT GIVE THEM TO YOUR DOCTOR. ° °1. A prescription for pain medication may be given to you upon discharge.  Take your pain medication as prescribed, if needed.  If narcotic pain medicine is not needed, then you may take acetaminophen (Tylenol) or ibuprofen (Advil) as needed. °2. Take your usually prescribed medications unless otherwise directed °3. If you need a refill on your pain medication, please contact your pharmacy.  They will contact our office to request authorization.  Prescriptions will not be filled after 5pm or on week-ends. °4. You should eat very light the first 24 hours after surgery, such as soup, crackers, pudding, etc.  Resume your normal diet the day after surgery. °5. Most patients will experience some swelling and bruising in the breast.  Ice packs and a good support bra will help.  Swelling and bruising can take several days to resolve.  °6. It is common to experience some constipation if taking pain medication after surgery.  Increasing fluid intake and taking a stool softener will usually help or prevent this problem from occurring.  A mild laxative (Milk of Magnesia or Miralax) should be taken according to package directions if there are no bowel movements after 48 hours. °7. Unless discharge instructions indicate otherwise, you may remove your bandages 24-48 hours after surgery, and you may shower at that time.  You may have steri-strips (small skin tapes) in place directly over the incision.  These strips should be left on the skin for 7-10 days.  If your surgeon used skin glue on the incision, you may shower in 24 hours.  The glue will flake off over the  next 2-3 weeks.  Any sutures or staples will be removed at the office during your follow-up visit. °8. ACTIVITIES:  You may resume regular daily activities (gradually increasing) beginning the next day.  Wearing a good support bra or sports bra minimizes pain and swelling.  You may have sexual intercourse when it is comfortable. °a. You may drive when you no longer are taking prescription pain medication, you can comfortably wear a seatbelt, and you can safely maneuver your car and apply brakes. °b. RETURN TO WORK:  ______________________________________________________________________________________ °9. You should see your doctor in the office for a follow-up appointment approximately two weeks after your surgery.  Your doctor’s nurse will typically make your follow-up appointment when she calls you with your pathology report.  Expect your pathology report 2-3 business days after your surgery.  You may call to check if you do not hear from us after three days. °10. OTHER INSTRUCTIONS: _______________________________________________________________________________________________ _____________________________________________________________________________________________________________________________________ °_____________________________________________________________________________________________________________________________________ °_____________________________________________________________________________________________________________________________________ ° °WHEN TO CALL YOUR DOCTOR: °1. Fever over 101.0 °2. Nausea and/or vomiting. °3. Extreme swelling or bruising. °4. Continued bleeding from incision. °5. Increased pain, redness, or drainage from the incision. ° °The clinic staff is available to answer your questions during regular business hours.  Please don’t hesitate to call and ask to speak to one of the nurses for clinical concerns.  If you have a medical emergency, go to the nearest  emergency room or call 911.  A surgeon from [facility] is always on call at the hospital. ° °For further questions, please visit [email or web address]  ° ° °  Post Anesthesia Home Care Instructions ° °Activity: °Get plenty of rest for the remainder of the day. A responsible adult should stay with you for 24 hours following the procedure.  °For the next 24 hours, DO NOT: °-Drive a car °-Operate machinery °-Drink alcoholic beverages °-Take any medication unless instructed by your physician °-Make any legal decisions or sign important papers. ° °Meals: °Start with liquid foods such as gelatin or soup. Progress to regular foods as tolerated. Avoid greasy, spicy, heavy foods. If nausea and/or vomiting occur, drink only clear liquids until the nausea and/or vomiting subsides. Call your physician if vomiting continues. ° °Special Instructions/Symptoms: °Your throat may feel dry or sore from the anesthesia or the breathing tube placed in your throat during surgery. If this causes discomfort, gargle with warm salt water. The discomfort should disappear within 24 hours. ° °

## 2015-01-30 NOTE — OR Nursing (Signed)
Dr. [name] given patients ruptured implant

## 2015-01-30 NOTE — Anesthesia Procedure Notes (Signed)
Procedure Name: Intubation Date/Time: 01/30/2015 10:42 AM Performed by: Lyndee Leo Pre-anesthesia Checklist: Patient identified, Emergency Drugs available, Suction available and Patient being monitored Patient Re-evaluated:Patient Re-evaluated prior to inductionOxygen Delivery Method: Circle System Utilized Preoxygenation: Pre-oxygenation with 100% oxygen Intubation Type: IV induction Ventilation: Mask ventilation without difficulty Laryngoscope Size: Mac and 3 Grade View: Grade II Tube type: Oral Tube size: 7.0 mm Number of attempts: 1 Airway Equipment and Method: Stylet and Oral airway Placement Confirmation: ETT inserted through vocal cords under direct vision,  positive ETCO2 and breath sounds checked- equal and bilateral Secured at: 20 cm Tube secured with: Tape Dental Injury: Teeth and Oropharynx as per pre-operative assessment

## 2015-01-30 NOTE — Interval H&P Note (Signed)
History and Physical Interval Note:  01/30/2015 8:01 AM  Kaylee Benson  has presented today for surgery, with the diagnosis of Right Breast Mass  The various methods of treatment have been discussed with the patient and family. After consideration of risks, benefits and other options for treatment, the patient has consented to  Procedure(s): BREAST LUMPECTOMY WITH NEEDLE LOCALIZATION (Right) BREAST CAPSULECTOMY WITH IMPLANT EXCHANGE (Right) as a surgical intervention .  The patient's history has been reviewed, patient examined, no change in status, stable for surgery.  I have reviewed the patient's chart and labs.  Questions were answered to the patient's satisfaction.     Zilla Shartzer A.

## 2015-01-30 NOTE — Anesthesia Postprocedure Evaluation (Signed)
  Anesthesia Post-op Note  Patient: Kaylee Benson  Procedure(s) Performed: Procedure(s): BREAST LUMPECTOMY WITH NEEDLE LOCALIZATION (Right) BREAST CAPSULECTOMY WITH IMPLANT EXCHANGE (Right)  Patient Location: PACU  Anesthesia Type: General LMA, General ETT   Level of Consciousness: awake, alert  and oriented  Airway and Oxygen Therapy: Patient Spontanous Breathing  Post-op Pain: mild  Post-op Assessment: Post-op Vital signs reviewed  Post-op Vital Signs: Reviewed  Last Vitals:  Filed Vitals:   01/30/15 1300  BP: 122/70  Pulse: 74  Temp:   Resp: 13    Complications: No apparent anesthesia complications

## 2015-01-30 NOTE — Transfer of Care (Signed)
Immediate Anesthesia Transfer of Care Note  Patient: Kaylee Benson  Procedure(s) Performed: Procedure(s): BREAST LUMPECTOMY WITH NEEDLE LOCALIZATION (Right) BREAST CAPSULECTOMY WITH IMPLANT EXCHANGE (Right)  Patient Location: PACU  Anesthesia Type:General  Level of Consciousness: awake, sedated and patient cooperative  Airway & Oxygen Therapy: Patient Spontanous Breathing and Patient connected to face mask oxygen  Post-op Assessment: Report given to RN and Post -op Vital signs reviewed and stable  Post vital signs: Reviewed and stable  Last Vitals:  Filed Vitals:   01/30/15 0859  BP: 108/74  Pulse: 69  Temp: 36.6 C  Resp: 20    Complications: No apparent anesthesia complications

## 2015-01-31 ENCOUNTER — Encounter (HOSPITAL_BASED_OUTPATIENT_CLINIC_OR_DEPARTMENT_OTHER): Payer: Self-pay | Admitting: Surgery

## 2015-01-31 NOTE — Consult Note (Unsigned)
NAME:  Kaylee Benson, Kaylee Benson NO.:  [id number]  MEDICAL RECORD NO.:  [id number]  LOCATION:                                 FACILITY:  PHYSICIAN:  Bea Graff, M.D.       DATE OF BIRTH:  [birth year]  DATE OF CONSULTATION:  01/30/2015 DATE OF DISCHARGE:  01/30/2015                                CONSULTATION   PREOPERATIVE DIAGNOSIS:  Bilateral hypomastia with deflated right breast implant and atypia of right breast.  POSTOPERATIVE DIAGNOSIS:  Bilateral hypomastia with deflated right breast implant and atypia of right breast.  DESCRIPTION OF PROCEDURE:  Removal and replacement of right breast implant, following breast biopsy by Dr. Brantley Stage.  SURGEON:  Bea Graff, M.D.  ANESTHESIA:  General.  COMPLICATIONS:  None.  DRAINS:  None.  TECHNIQUE:  The patient was given general anesthesia with sterile prep and drape, following the open biopsy of the right breast performed by Dr. Brantley Stage.  Please see his OP note for particulars of his part of the procedure.  The inframammary incision was marked on the right wrist and injected with 0.25% Marcaine with epinephrine.  The breast tissue and pectoral muscles were then opened with the Bovie on the cutting curette to expose the implant capture space. The implant was collapsed and positioned in the lateral aspect of the capture space.  The last implant was removed.  The lateral aspect of the pocket is slightly contracted, therefore, it was released with the Bovie after injection of the lateral parameter with the Marcaine with epinephrine.  An implant of 375 cc total fill volume was inserted using a Mentor saline implant.  This was implanted to a total volume of 375 cc.  The specimen removed from the patient was approximately 16 g in weight.  This was not enough volume change to require different sized implant.  Closure of the muscle was performed with 3-0 Vicryl.  Closure of the deep tissue with 3-0 Vicryl and skin closure with  running intradermal and interrupted 5-0 Vicryl. Steri-Strips were placed across both external excisions and sterile dressings applied.  The patient tolerated the procedure well and sent to recovery in stable condition.          ______________________________ Bea Graff, M.D.     DB/MEDQ  D:  01/30/2015  T:  01/31/2015  Job:  [id number]

## 2015-02-04 ENCOUNTER — Other Ambulatory Visit (INDEPENDENT_AMBULATORY_CARE_PROVIDER_SITE_OTHER): Payer: Self-pay | Admitting: Surgery

## 2015-02-04 DIAGNOSIS — D0511 Intraductal carcinoma in situ of right breast: Secondary | ICD-10-CM

## 2015-02-12 ENCOUNTER — Ambulatory Visit
Admit: 2015-02-12 | Disposition: A | Discharge status: Home / Self Care | Payer: Self-pay | Attending: Oncology | Admitting: Oncology

## 2015-02-12 ENCOUNTER — Encounter: Payer: Self-pay | Admitting: Oncology

## 2015-02-13 ENCOUNTER — Encounter: Payer: Self-pay | Admitting: Radiation Oncology

## 2015-02-13 NOTE — Progress Notes (Signed)
Location of Breast Cancer: right  Histology per Pathology Report:  01/30/15 Diagnosis 1. Breast, lumpectomy, right - LOW GRADE DUCTAL CARCINOMA IN SITU, 0.45 CM IN GREATEST DIMENSION. - MARGINS ARE NEGATIVE. - SEE ONCOLOGY TEMPLATE. 2. Breast, excision, right breast additional superior margin - FIBROCYSTIC CHANGES WITH USUAL DUCTAL HYPERPLASIA. - NO ATYPIA OR MALIGNANCY IDENTIFIED.  Receptor Status: ER(100%), PR (100%), Her2-neu ()  Did patient present with symptoms (if so, please note symptoms) or was this found on screening mammography?: screening mammogram  Past/Anticipated interventions by surgeon, if any:  Right lumpectomy with re-excision for additional margin-  Dr Brantley Stage  Past/Anticipated interventions by medical oncology, if any: Chemotherapy , no appointment scheduled at this time  Lymphedema issues, if any:      Pain issues, if any:     SAFETY ISSUES:  Prior radiation? no  Pacemaker/ICD? no  Possible current pregnancy? No, Essure procedure performed 1 year ago  Is the patient on methotrexate? no  Current Complaints / other details:  Menarche age 75, G66, [age], maternal age 78-25, Essure procedure 1 year ago. Bilateral breast implants, rupture on right. Right implant replaced by Dr [name] when lumpectomy performed.     Andria Rhein, RN 02/13/2015,9:36 AM

## 2015-02-15 ENCOUNTER — Ambulatory Visit: Payer: 59

## 2015-02-15 ENCOUNTER — Ambulatory Visit
Admission: RE | Admit: 2015-02-15 | Discharge: 2015-02-15 | Disposition: A | Discharge status: Home / Self Care | Payer: 59 | Source: Ambulatory Visit | Attending: Radiation Oncology | Admitting: Radiation Oncology

## 2015-02-15 HISTORY — DX: Calculus of kidney: N20.0

## 2015-02-15 HISTORY — DX: Allergy, unspecified, initial encounter: T78.40XA

## 2015-02-22 ENCOUNTER — Ambulatory Visit
Admit: 2015-02-22 | Disposition: A | Discharge status: Home / Self Care | Payer: Self-pay | Attending: Oncology | Admitting: Oncology

## 2015-03-07 LAB — CBC CANCER CENTER
Basophil #: 0 x10 3/mm (ref 0.0–0.1)
Basophil %: 0.5 %
Eosinophil #: 0.1 x10 3/mm (ref 0.0–0.7)
Eosinophil %: 1.3 %
HCT: 40.7 % (ref 35.0–47.0)
HGB: 13.5 g/dL (ref 12.0–16.0)
LYMPHS ABS: 1 x10 3/mm (ref 1.0–3.6)
Lymphocyte %: 11.6 %
MCH: 29.2 pg (ref 26.0–34.0)
MCHC: 33.1 g/dL (ref 32.0–36.0)
MCV: 88 fL (ref 80–100)
MONO ABS: 0.4 x10 3/mm (ref 0.2–0.9)
Monocyte %: 5.3 %
Neutrophil #: 6.8 x10 3/mm — ABNORMAL HIGH (ref 1.4–6.5)
Neutrophil %: 81.3 %
PLATELETS: 249 x10 3/mm (ref 150–440)
RBC: 4.61 10*6/uL (ref 3.80–5.20)
RDW: 14 % (ref 11.5–14.5)
WBC: 8.3 x10 3/mm (ref 3.6–11.0)

## 2015-03-14 LAB — CBC CANCER CENTER
BASOS PCT: 0.3 %
Basophil #: 0 x10 3/mm (ref 0.0–0.1)
EOS PCT: 1.1 %
Eosinophil #: 0.1 x10 3/mm (ref 0.0–0.7)
HCT: 38.8 % (ref 35.0–47.0)
HGB: 12.9 g/dL (ref 12.0–16.0)
LYMPHS ABS: 1.1 x10 3/mm (ref 1.0–3.6)
Lymphocyte %: 15.1 %
MCH: 29.1 pg (ref 26.0–34.0)
MCHC: 33.2 g/dL (ref 32.0–36.0)
MCV: 88 fL (ref 80–100)
MONOS PCT: 6.8 %
Monocyte #: 0.5 x10 3/mm (ref 0.2–0.9)
NEUTROS ABS: 5.5 x10 3/mm (ref 1.4–6.5)
Neutrophil %: 76.7 %
Platelet: 227 x10 3/mm (ref 150–440)
RBC: 4.42 10*6/uL (ref 3.80–5.20)
RDW: 14.1 % (ref 11.5–14.5)
WBC: 7.1 x10 3/mm (ref 3.6–11.0)

## 2015-03-17 ENCOUNTER — Encounter: Payer: Self-pay | Admitting: *Deleted

## 2015-03-21 ENCOUNTER — Other Ambulatory Visit: Payer: Self-pay | Admitting: *Deleted

## 2015-03-21 ENCOUNTER — Encounter: Payer: Self-pay | Admitting: *Deleted

## 2015-03-21 DIAGNOSIS — D0591 Unspecified type of carcinoma in situ of right breast: Secondary | ICD-10-CM

## 2015-03-21 LAB — CBC CANCER CENTER
Basophil #: 0 x10 3/mm (ref 0.0–0.1)
Basophil %: 0.5 %
EOS PCT: 0.8 %
Eosinophil #: 0 x10 3/mm (ref 0.0–0.7)
HCT: 39.7 % (ref 35.0–47.0)
HGB: 13.1 g/dL (ref 12.0–16.0)
LYMPHS ABS: 0.6 x10 3/mm — AB (ref 1.0–3.6)
Lymphocyte %: 11.1 %
MCH: 29.1 pg (ref 26.0–34.0)
MCHC: 33 g/dL (ref 32.0–36.0)
MCV: 88 fL (ref 80–100)
MONO ABS: 0.5 x10 3/mm (ref 0.2–0.9)
Monocyte %: 8.9 %
Neutrophil #: 4.1 x10 3/mm (ref 1.4–6.5)
Neutrophil %: 78.7 %
Platelet: 235 x10 3/mm (ref 150–440)
RBC: 4.5 10*6/uL (ref 3.80–5.20)
RDW: 13.9 % (ref 11.5–14.5)
WBC: 5.2 x10 3/mm (ref 3.6–11.0)

## 2015-03-22 ENCOUNTER — Ambulatory Visit: Admission: RE | Admit: 2015-03-22 | Payer: 59 | Source: Ambulatory Visit

## 2015-03-24 NOTE — Consult Note (Signed)
Reason for Visit: This 44 year old Female patient presents to the clinic for initial evaluation of  breast cancer .   Referred by Dr. Oliva Bustard.  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 When a double wide local excision and some repair of her saline breast implants secondary to leakage after her biopsy. Tumor was by verbal report 0.5 cm ER/PR positive. Margins were negative. Patient had breast implants at age 2. She is done well postoperatively. She is seen medical oncology college [facility] who is recommending tamoxifen therapy is now referred to radiation oncology for consideration of treatment. She specifically denies breast tenderness cough or bone pain at this time.  Past Hx:    DCIS right breast:    kidney stones:    ORIF left ankle: 07-Apr-2007  Past, Family and Social History:  Past Medical History positive   Genitourinary kidney stones   Past Surgical History ORIF left ankle, bilateral breast implants at age 82   Family History positive   Family History Comments mother with breast cancer also family history of hypertension cardiac disease.   Social History noncontributory   Additional Past Medical and Surgical History accompanied by her husband today   Allergies:   No Known Allergies:   Home Meds:  Home Medications: Medication Instructions Status  multivitamin   once a day  Active   Review of  Systems:  General negative   Performance Status (ECOG) 0   Skin negative   Breast see HPI   Ophthalmologic negative   ENMT negative   Respiratory and Thorax negative   Cardiovascular negative   Gastrointestinal negative   Genitourinary negative   Musculoskeletal negative   Neurological negative   Psychiatric negative   Hematology/Lymphatics negative   Endocrine negative   Allergic/Immunologic negative   Review of Systems   denies any weight loss, fatigue, weakness, fever, chills or night sweats. Patient denies any loss of vision, blurred vision. Patient denies any ringing  of the ears or hearing loss. No irregular heartbeat. Patient denies heart murmur or history of fainting. Patient denies any chest pain or pain radiating to her upper extremities. Patient denies any shortness of breath, difficulty breathing at night, cough or hemoptysis. Patient denies any swelling in the lower legs. Patient denies any nausea vomiting, vomiting of blood, or coffee ground material in the vomitus. Patient denies any stomach pain. Patient states has had normal bowel movements no significant constipation or diarrhea. Patient denies any dysuria, hematuria or significant nocturia. Patient denies any problems walking, swelling in the joints or loss of balance. Patient denies any skin changes, loss of hair or loss of weight. Patient denies any excessive worrying or anxiety or significant depression. Patient denies any problems with insomnia. Patient denies excessive thirst, polyuria, polydipsia. Patient denies any swollen glands, patient denies easy bruising or easy bleeding. Patient denies any recent infections, allergies or URI. Patient "s visual fields have not changed significantly in recent time.   Nursing Notes:  Nursing Vital  Signs and Chemo Nursing Nursing Notes: *CC Vital Signs Flowsheet:   28-Mar-16 09:24  Temp Temperature 98.9  Pulse Pulse 93  Respirations Respirations 18  SBP SBP 110   DBP DBP 69  Pain Scale (0-10)  0  Height (cm) centimeters 152  BSA (m2) 1.5   Physical Exam:  General/Skin/HEENT:  General normal   Skin normal   Eyes normal   ENMT normal   Head and Neck normal   Additional PE well-developed female in NAD. Lungs are clear to A&P quick examination shows regular rate and rhythm she is status post wide local excision of the right breast. She has bilateral breast implants. No dominant mass or nodularity is noted in either breast in 2 positions examined. No axillary or supraclavicular adenopathy is identified. Abdomen is benign.   Breasts/Resp/CV/GI/GU:  Respiratory and Thorax normal   Cardiovascular normal   Gastrointestinal normal   Genitourinary normal   MS/Neuro/Psych/Lymph:  Musculoskeletal normal   Neurological normal   Lymphatics normal   Assessment and Plan: Impression:   stage 0 (Tis N0 M0) ductal carcinoma in situ ER/PR positive status post wide local excision in 44 year old female with bilateral breast implants Plan:   this time I recommend whole breast radiation to her right breast. Would plan on delivering 5000 cGy over 5 weeks. I have requested her pathology report I like to avoid a scar boost if possible because of her breast implants. Risks and benefits of treatment including skin reaction, fatigue, inclusion of some superficial lung, and possible contraction around her implant all were discussed in detail with the patient and her husband. Both seem to comprehend my treatment well. I have set up and ordered CT simulation later this week.  I would like to take this opportunity for allowing me to participate in the care of your patient..  Electronic Signatures: Orella Cushman, Roda Shutters (MD)  (Signed 28-Mar-16 10:13)  Authored: HPI, Diagnosis, Past Hx, PFSH, Allergies, Home Meds, ROS, Nursing Notes, Physical Exam, Encounter Assessment and Plan   Last Updated: 28-Mar-16 10:13 by Armstead Peaks (MD)

## 2015-03-25 ENCOUNTER — Ambulatory Visit: Payer: 59 | Admitting: Radiation Oncology

## 2015-03-25 ENCOUNTER — Ambulatory Visit
Admission: RE | Admit: 2015-03-25 | Discharge: 2015-03-25 | Disposition: A | Discharge status: Home / Self Care | Payer: 59 | Source: Ambulatory Visit | Attending: Radiation Oncology | Admitting: Radiation Oncology

## 2015-03-25 DIAGNOSIS — Z510 Encounter for antineoplastic radiation therapy: Secondary | ICD-10-CM | POA: Insufficient documentation

## 2015-03-25 DIAGNOSIS — D0511 Intraductal carcinoma in situ of right breast: Secondary | ICD-10-CM | POA: Diagnosis not present

## 2015-03-26 ENCOUNTER — Ambulatory Visit
Admission: RE | Admit: 2015-03-26 | Discharge: 2015-03-26 | Disposition: A | Discharge status: Home / Self Care | Payer: 59 | Source: Ambulatory Visit | Attending: Radiation Oncology | Admitting: Radiation Oncology

## 2015-03-26 DIAGNOSIS — D0511 Intraductal carcinoma in situ of right breast: Secondary | ICD-10-CM | POA: Diagnosis not present

## 2015-03-27 ENCOUNTER — Ambulatory Visit
Admission: RE | Admit: 2015-03-27 | Discharge: 2015-03-27 | Disposition: A | Discharge status: Home / Self Care | Payer: 59 | Source: Ambulatory Visit | Attending: Radiation Oncology | Admitting: Radiation Oncology

## 2015-03-27 DIAGNOSIS — D0511 Intraductal carcinoma in situ of right breast: Secondary | ICD-10-CM | POA: Diagnosis not present

## 2015-03-28 ENCOUNTER — Other Ambulatory Visit: Payer: 59

## 2015-03-28 ENCOUNTER — Ambulatory Visit
Admission: RE | Admit: 2015-03-28 | Discharge: 2015-03-28 | Disposition: A | Discharge status: Home / Self Care | Payer: 59 | Source: Ambulatory Visit | Attending: Radiation Oncology | Admitting: Radiation Oncology

## 2015-03-28 DIAGNOSIS — D0511 Intraductal carcinoma in situ of right breast: Secondary | ICD-10-CM | POA: Diagnosis not present

## 2015-03-29 ENCOUNTER — Ambulatory Visit
Admission: RE | Admit: 2015-03-29 | Discharge: 2015-03-29 | Disposition: A | Discharge status: Home / Self Care | Payer: 59 | Source: Ambulatory Visit | Attending: Radiation Oncology | Admitting: Radiation Oncology

## 2015-03-29 DIAGNOSIS — D0511 Intraductal carcinoma in situ of right breast: Secondary | ICD-10-CM | POA: Diagnosis not present

## 2015-04-01 ENCOUNTER — Ambulatory Visit
Admission: RE | Admit: 2015-04-01 | Discharge: 2015-04-01 | Disposition: A | Discharge status: Home / Self Care | Payer: 59 | Source: Ambulatory Visit | Attending: Radiation Oncology | Admitting: Radiation Oncology

## 2015-04-01 DIAGNOSIS — D0511 Intraductal carcinoma in situ of right breast: Secondary | ICD-10-CM | POA: Diagnosis not present

## 2015-04-02 ENCOUNTER — Ambulatory Visit
Admission: RE | Admit: 2015-04-02 | Discharge: 2015-04-02 | Disposition: A | Discharge status: Home / Self Care | Payer: 59 | Source: Ambulatory Visit | Attending: Radiation Oncology | Admitting: Radiation Oncology

## 2015-04-02 DIAGNOSIS — D0511 Intraductal carcinoma in situ of right breast: Secondary | ICD-10-CM | POA: Diagnosis not present

## 2015-04-02 NOTE — H&P (Signed)
L&D Evaluation:  History Expanded:   HPI pt has been in latent labor for the last few days with multiple visits to the ld. she had twins by IVF and she lost the first one.she now has a singletoin.    Blood Type O positive    Group B Strep Results (Result >5wks must be treated as unknown) negative    Maternal HIV Negative    Maternal Syphilis Ab Nonreactive    Maternal Varicella Immune    Rubella Results immune    Maternal T-Dap Immune    [facility] 17-Dec-2011    Presents with SROM    Patient's Medical History No Chronic Illness    Patient's Surgical History ivf    Medications Pre Natal Vitamins    Allergies NKDA    Social History none    Family History Non-Contributory   ROS:   ROS All systems were reviewed.  HEENT, CNS, GI, GU, Respiratory, CV, Renal and Musculoskeletal systems were found to be normal.   Exam:   Vital Signs stable    Back no CVAT    Edema no edema    Reflexes 1+    Description clear    Skin dry    Lymph no lymphadenopathy   Plan:   Follow Up Appointment in 6 weeks   Electronic Signatures: Erik Obey (MD)  (Signed 09-Jan-13 08:33)  Authored: L&D Evaluation   Last Updated: 09-Jan-13 08:33 by Erik Obey (MD)

## 2015-04-03 ENCOUNTER — Ambulatory Visit
Admission: RE | Admit: 2015-04-03 | Discharge: 2015-04-03 | Disposition: A | Discharge status: Home / Self Care | Payer: 59 | Source: Ambulatory Visit | Attending: Radiation Oncology | Admitting: Radiation Oncology

## 2015-04-03 DIAGNOSIS — D0511 Intraductal carcinoma in situ of right breast: Secondary | ICD-10-CM | POA: Diagnosis not present

## 2015-04-04 ENCOUNTER — Inpatient Hospital Stay: Payer: 59 | Attending: Radiation Oncology

## 2015-04-04 ENCOUNTER — Ambulatory Visit
Admission: RE | Admit: 2015-04-04 | Discharge: 2015-04-04 | Disposition: A | Discharge status: Home / Self Care | Payer: 59 | Source: Ambulatory Visit

## 2015-04-04 DIAGNOSIS — D0511 Intraductal carcinoma in situ of right breast: Secondary | ICD-10-CM | POA: Diagnosis present

## 2015-04-04 DIAGNOSIS — D0591 Unspecified type of carcinoma in situ of right breast: Secondary | ICD-10-CM

## 2015-04-04 DIAGNOSIS — Z79810 Long term (current) use of selective estrogen receptor modulators (SERMs): Secondary | ICD-10-CM | POA: Diagnosis not present

## 2015-04-04 DIAGNOSIS — Z87442 Personal history of urinary calculi: Secondary | ICD-10-CM | POA: Insufficient documentation

## 2015-04-04 DIAGNOSIS — Z923 Personal history of irradiation: Secondary | ICD-10-CM | POA: Insufficient documentation

## 2015-04-04 DIAGNOSIS — Z803 Family history of malignant neoplasm of breast: Secondary | ICD-10-CM | POA: Diagnosis not present

## 2015-04-04 DIAGNOSIS — Z170 Estrogen receptor positive status [ER+]: Secondary | ICD-10-CM | POA: Insufficient documentation

## 2015-04-04 DIAGNOSIS — Z79899 Other long term (current) drug therapy: Secondary | ICD-10-CM | POA: Diagnosis not present

## 2015-04-04 LAB — CBC
HCT: 38.9 % (ref 35.0–47.0)
Hemoglobin: 12.8 g/dL (ref 12.0–16.0)
MCH: 29.2 pg (ref 26.0–34.0)
MCHC: 33 g/dL (ref 32.0–36.0)
MCV: 88.3 fL (ref 80.0–100.0)
Platelets: 243 10*3/uL (ref 150–440)
RBC: 4.41 MIL/uL (ref 3.80–5.20)
RDW: 13.9 % (ref 11.5–14.5)
WBC: 7.5 10*3/uL (ref 3.6–11.0)

## 2015-04-05 ENCOUNTER — Ambulatory Visit
Admission: RE | Admit: 2015-04-05 | Discharge: 2015-04-05 | Disposition: A | Discharge status: Home / Self Care | Payer: 59 | Source: Ambulatory Visit | Attending: Radiation Oncology | Admitting: Radiation Oncology

## 2015-04-05 DIAGNOSIS — D0511 Intraductal carcinoma in situ of right breast: Secondary | ICD-10-CM | POA: Diagnosis not present

## 2015-04-08 ENCOUNTER — Ambulatory Visit
Admission: RE | Admit: 2015-04-08 | Discharge: 2015-04-08 | Disposition: A | Discharge status: Home / Self Care | Payer: 59 | Source: Ambulatory Visit | Attending: Radiation Oncology | Admitting: Radiation Oncology

## 2015-04-08 DIAGNOSIS — D0511 Intraductal carcinoma in situ of right breast: Secondary | ICD-10-CM | POA: Diagnosis not present

## 2015-04-09 ENCOUNTER — Ambulatory Visit
Admission: RE | Admit: 2015-04-09 | Discharge: 2015-04-09 | Disposition: A | Discharge status: Home / Self Care | Payer: 59 | Source: Ambulatory Visit | Attending: Radiation Oncology | Admitting: Radiation Oncology

## 2015-04-09 DIAGNOSIS — D0511 Intraductal carcinoma in situ of right breast: Secondary | ICD-10-CM | POA: Diagnosis not present

## 2015-04-10 ENCOUNTER — Ambulatory Visit
Admission: RE | Admit: 2015-04-10 | Discharge: 2015-04-10 | Disposition: A | Discharge status: Home / Self Care | Payer: 59 | Source: Ambulatory Visit | Attending: Radiation Oncology | Admitting: Radiation Oncology

## 2015-04-10 DIAGNOSIS — D0511 Intraductal carcinoma in situ of right breast: Secondary | ICD-10-CM | POA: Diagnosis not present

## 2015-04-12 ENCOUNTER — Other Ambulatory Visit: Payer: Self-pay | Admitting: *Deleted

## 2015-04-12 DIAGNOSIS — C50919 Malignant neoplasm of unspecified site of unspecified female breast: Secondary | ICD-10-CM

## 2015-04-15 ENCOUNTER — Inpatient Hospital Stay: Payer: 59

## 2015-04-15 ENCOUNTER — Inpatient Hospital Stay (HOSPITAL_BASED_OUTPATIENT_CLINIC_OR_DEPARTMENT_OTHER): Payer: 59 | Admitting: Oncology

## 2015-04-15 ENCOUNTER — Other Ambulatory Visit: Payer: 59

## 2015-04-15 ENCOUNTER — Encounter: Payer: Self-pay | Admitting: Oncology

## 2015-04-15 ENCOUNTER — Encounter (INDEPENDENT_AMBULATORY_CARE_PROVIDER_SITE_OTHER): Payer: Self-pay

## 2015-04-15 ENCOUNTER — Ambulatory Visit: Payer: 59 | Admitting: Oncology

## 2015-04-15 VITALS — BP 109/76 | HR 73 | Wt 120.4 lb

## 2015-04-15 DIAGNOSIS — D0511 Intraductal carcinoma in situ of right breast: Secondary | ICD-10-CM

## 2015-04-15 DIAGNOSIS — Z79899 Other long term (current) drug therapy: Secondary | ICD-10-CM

## 2015-04-15 DIAGNOSIS — Z79810 Long term (current) use of selective estrogen receptor modulators (SERMs): Secondary | ICD-10-CM

## 2015-04-15 DIAGNOSIS — D0591 Unspecified type of carcinoma in situ of right breast: Secondary | ICD-10-CM

## 2015-04-15 DIAGNOSIS — D0590 Unspecified type of carcinoma in situ of unspecified breast: Secondary | ICD-10-CM

## 2015-04-15 DIAGNOSIS — Z87442 Personal history of urinary calculi: Secondary | ICD-10-CM

## 2015-04-15 DIAGNOSIS — C50919 Malignant neoplasm of unspecified site of unspecified female breast: Secondary | ICD-10-CM

## 2015-04-15 DIAGNOSIS — Z170 Estrogen receptor positive status [ER+]: Secondary | ICD-10-CM

## 2015-04-15 DIAGNOSIS — Z923 Personal history of irradiation: Secondary | ICD-10-CM

## 2015-04-15 DIAGNOSIS — Z803 Family history of malignant neoplasm of breast: Secondary | ICD-10-CM

## 2015-04-15 HISTORY — DX: Unspecified type of carcinoma in situ of unspecified breast: D05.90

## 2015-04-15 LAB — CBC WITH DIFFERENTIAL/PLATELET
BASOS ABS: 0 10*3/uL (ref 0–0.1)
BASOS PCT: 1 %
EOS PCT: 2 %
Eosinophils Absolute: 0.1 10*3/uL (ref 0–0.7)
HCT: 39.2 % (ref 35.0–47.0)
HEMOGLOBIN: 12.7 g/dL (ref 12.0–16.0)
LYMPHS ABS: 0.5 10*3/uL — AB (ref 1.0–3.6)
LYMPHS PCT: 9 %
MCH: 29 pg (ref 26.0–34.0)
MCHC: 32.5 g/dL (ref 32.0–36.0)
MCV: 89.2 fL (ref 80.0–100.0)
Monocytes Absolute: 0.4 10*3/uL (ref 0.2–0.9)
Monocytes Relative: 9 %
Neutro Abs: 4.2 10*3/uL (ref 1.4–6.5)
Neutrophils Relative %: 79 %
PLATELETS: 219 10*3/uL (ref 150–440)
RBC: 4.4 MIL/uL (ref 3.80–5.20)
RDW: 14.2 % (ref 11.5–14.5)
WBC: 5.3 10*3/uL (ref 3.6–11.0)

## 2015-04-15 LAB — COMPREHENSIVE METABOLIC PANEL
ALBUMIN: 4.5 g/dL (ref 3.5–5.0)
ALK PHOS: 45 U/L (ref 38–126)
ALT: 18 U/L (ref 14–54)
ANION GAP: 5 (ref 5–15)
AST: 26 U/L (ref 15–41)
BUN: 24 mg/dL — AB (ref 6–20)
CO2: 24 mmol/L (ref 22–32)
Calcium: 8.6 mg/dL — ABNORMAL LOW (ref 8.9–10.3)
Chloride: 102 mmol/L (ref 101–111)
Creatinine, Ser: 0.87 mg/dL (ref 0.44–1.00)
GFR calc non Af Amer: >60 mL/min (ref >60)
Glucose, Bld: 98 mg/dL (ref 65–99)
Potassium: 3.9 mmol/L (ref 3.5–5.1)
Sodium: 131 mmol/L — ABNORMAL LOW (ref 135–145)
Total Bilirubin: 0.7 mg/dL (ref 0.3–1.2)
Total Protein: 7.3 g/dL (ref 6.5–8.1)

## 2015-04-15 MED ORDER — TAMOXIFEN CITRATE 20 MG PO TABS
20.0000 mg | ORAL_TABLET | Freq: Every day | ORAL | Status: DC
Start: 2015-04-15 — End: 2015-10-11

## 2015-04-15 NOTE — Progress Notes (Signed)
[place] @ [facility] [phone]  Fax:([phone] [place]: Feb 28, 1971  MR#: [id number]  VOZ#:[id number]  Patient Care Team: Jerrol Banana., MD as PCP - General (Family Medicine)  CHIEF COMPLAINT:  Chief Complaint  Patient presents with  . Follow-up     No history exists.   44 year old lady with abnormal mammogram had a lumpectomy doneon the right breast right outer and  lower quadrant;biopsies positive for ductal carcinoma in situ 0.5 cm in size.  Verbal report is estrogen and progesterone receptor positive (March, 2016) margins after reexcision's are negative. Patient has previous history of implant in both breast Has a strong family history of breast cancer.  My risk  panel was negative.  No clinically significant mutation was identified. Oncology Flowsheet 01/30/2015  dexamethasone (DECADRON) IJ -  ondansetron (ZOFRAN) IV -  scopolamine 1 MG/3DAYS (TRANSDERM-SCOP) TD 1.5 mg    INTERVAL HISTORY: 44 year old lady with a history of carcinoma in situ of the right breast at resection followed by radiation therapy.  Here for further follow-up.  Patient has skin reaction but no other major significant problem.   REVIEW OF SYSTEMS:   GENERAL:  Feels good.  Active.  No fevers, sweats or weight loss. PERFORMANCE STATUS (ECOG):  0 HEENT:  No visual changes, runny nose, sore throat, mouth sores or tenderness. Lungs: No shortness of breath or cough.  No hemoptysis. Cardiac:  No chest pain, palpitations, orthopnea, or PND. GI:  No nausea, vomiting, diarrhea, constipation, melena or hematochezia. GU:  No urgency, frequency, dysuria, or hematuria. Musculoskeletal:  No back pain.  No joint pain.  No muscle tenderness. Extremities:  No pain or swelling. Skin:  No rashes or skin changes. Neuro:  No headache, numbness or weakness, balance or coordination issues. Endocrine:  No diabetes, thyroid issues, hot flashes or night sweats. Psych:  No mood changes,  depression or anxiety. Pain:  No focal pain. Review of systems:  All other systems reviewed and found to be negative.  As per HPI. Otherwise, a complete review of systems is negatve.  PAST MEDICAL HISTORY: Past Medical History  Diagnosis Date  . Kidney stones   . PONV (postoperative nausea and vomiting)     nausea  . Allergy     seasonal  . Kidney stone   . Breast cancer   . Carcinoma in situ of breast 04/15/2015    PAST SURGICAL HISTORY: Past Surgical History  Procedure Laterality Date  . Breast surgery      bilateral breast implants 2001  . Surgery on left lower leg      2007  . Breast lumpectomy with needle localization Right 01/30/2015    Procedure: BREAST LUMPECTOMY WITH NEEDLE LOCALIZATION;  Surgeon: Erroll Luna, MD;  Location: [place];  Service: General;  Laterality: Right;  . Breast capsulectomy with implant exchange Right 01/30/2015    Procedure: BREAST CAPSULECTOMY WITH IMPLANT EXCHANGE;  Surgeon: Bea Graff, MD;  Location: [place];  Service: Plastics;  Laterality: Right;  . Breast enhancement surgery Bilateral   . Foot surgery Left   . Mouth surgery    . Orif ankle fracture      left    FAMILY HISTORY Family History  Problem Relation Age of Onset  . Breast cancer Mother   . Heart disease Mother   . Hypertension Mother         ADVANCED DIRECTIVES:   Patient  does not have any advance care directive  HEALTH MAINTENANCE: History  Substance Use Topics  . Smoking status: Never Smoker   . Smokeless tobacco: Not on file  . Alcohol Use: Yes     Comment: Drinks glass of wine 1-2 a month     No Known Allergies  Current Outpatient Prescriptions  Medication Sig Dispense Refill  . BIOTIN PO Take 1 tablet by mouth.    . Calcium Carbonate-Vitamin D (CALCIUM + D PO) Take by mouth once.    . Multiple Vitamin (MULTIVITAMIN) tablet Take 1 tablet by mouth daily.    . naproxen sodium (ANAPROX) 220 MG tablet Take 220 mg by  mouth as needed.    Marland Kitchen HYDROcodone-acetaminophen (NORCO/VICODIN) 5-325 MG per tablet   0  . oxyCODONE-acetaminophen (ROXICET) 5-325 MG per tablet Take 1-2 tablets by mouth every 4 (four) hours as needed. (Patient not taking: Reported on 03/17/2015) 30 tablet 0  . phentermine 37.5 MG capsule Take 37.5 mg by mouth every morning. Pt takes 1/2 tab    . promethazine (PHENERGAN) 25 MG tablet   0   No current facility-administered medications for this visit.    OBJECTIVE:  Filed Vitals:   04/15/15 0950  BP: 109/76  Pulse: 73     Body mass index is 23.63 kg/(m^2).    ECOG FS:0 - Asymptomatic  PHYSICAL EXAM: GENERAL:  Well developed, well nourished, sitting comfortably in the exam room in no acute distress. MENTAL STATUS:  Alert and oriented to person, place and time. HEAD: Normocephalic, atraumatic, face symmetric, no Cushingoid features. EYES:  .  Pupils equal round and reactive to light and accomodation.  No conjunctivitis or scleral icterus. ENT:  Oropharynx clear without lesion.  Tongue normal. Mucous membranes moist.  RESPIRATORY:  Clear to auscultation without rales, wheezes or rhonchi. CARDIOVASCULAR:  Regular rate and rhythm without murmur, rub or gallop. BREAST:  Patient had bilateral breast reconstructive surgery.  Right breast redness and radiation changes. ABDOMEN:  Soft, non-tender, with active bowel sounds, and no hepatosplenomegaly.  No masses. BACK:  No CVA tenderness.  No tenderness on percussion of the back or rib cage. SKIN:  No rashes, ulcers or lesions. EXTREMITIES: No edema, no skin discoloration or tenderness.  No palpable cords. LYMPH NODES: No palpable cervical, supraclavicular, axillary or inguinal adenopathy  NEUROLOGICAL: Unremarkable. PSYCH:  Appropriate.   LAB RESULTS:  Appointment on 04/15/2015  Component Date Value Ref Range Status  . WBC 04/15/2015 5.3  3.6 - 11.0 K/uL Final  . RBC 04/15/2015 4.40  3.80 - 5.20 MIL/uL Final  . Hemoglobin 04/15/2015 12.7   12.0 - 16.0 g/dL Final  . HCT 04/15/2015 39.2  35.0 - 47.0 % Final  . MCV 04/15/2015 89.2  80.0 - 100.0 fL Final  . MCH 04/15/2015 29.0  26.0 - 34.0 pg Final  . MCHC 04/15/2015 32.5  32.0 - 36.0 g/dL Final  . RDW 04/15/2015 14.2  11.5 - 14.5 % Final  . Platelets 04/15/2015 219  150 - 440 K/uL Final  . Neutrophils Relative % 04/15/2015 79   Final  . Neutro Abs 04/15/2015 4.2  1.4 - 6.5 K/uL Final  . Lymphocytes Relative 04/15/2015 9   Final  . Lymphs Abs 04/15/2015 0.5* 1.0 - 3.6 K/uL Final  . Monocytes Relative 04/15/2015 9   Final  . Monocytes Absolute 04/15/2015 0.4  0.2 - 0.9 K/uL Final  . Eosinophils Relative 04/15/2015 2   Final  . Eosinophils Absolute 04/15/2015 0.1  0 - 0.7 K/uL Final  . Basophils Relative 04/15/2015 1  Final  . Basophils Absolute 04/15/2015 0.0  0 - 0.1 K/uL Final  . Sodium 04/15/2015 131* 135 - 145 mmol/L Final  . Potassium 04/15/2015 3.9  3.5 - 5.1 mmol/L Final  . Chloride 04/15/2015 102  101 - 111 mmol/L Final  . CO2 04/15/2015 24  22 - 32 mmol/L Final  . Glucose, Bld 04/15/2015 98  65 - 99 mg/dL Final  . BUN 04/15/2015 24* 6 - 20 mg/dL Final  . Creatinine, Ser 04/15/2015 0.87  0.44 - 1.00 mg/dL Final  . Calcium 04/15/2015 8.6* 8.9 - 10.3 mg/dL Final  . Total Protein 04/15/2015 7.3  6.5 - 8.1 g/dL Final  . Albumin 04/15/2015 4.5  3.5 - 5.0 g/dL Final  . AST 04/15/2015 26  15 - 41 U/L Final  . ALT 04/15/2015 18  14 - 54 U/L Final  . Alkaline Phosphatase 04/15/2015 45  38 - 126 U/L Final  . Total Bilirubin 04/15/2015 0.7  0.3 - 1.2 mg/dL Final  . GFR calc non Af Amer 04/15/2015 >60  >60 mL/min Final  . GFR calc Af Amer 04/15/2015 >60  >60 mL/min Final   Comment: (NOTE) The eGFR has been calculated using the CKD EPI equation. This calculation has not been validated in all clinical situations. eGFR's persistently <60 mL/min signify possible Chronic Kidney Disease.   . Anion gap 04/15/2015 5  5 - 15 Final     STUDIES: No results  found.  ASSESSMENT: Carcinoma of right breast ductal carcinoma in situ status post resection and radiation therapy BRCA mutation studies were negative my risperidone for mutation was negative  MEDICAL DECISION MAKING:  All lab data has been reviewed.  Patient would be started on tamoxifen 20 mg daily.  We discussed pros and cones of tamoxifen.  And need for regular GYN checkup.  Patient was warned regarding higher incidence of cancer of uterus.  And if develops any unusual vaginal bleeding to get in touch with her gynecologist on call us immediately and stop tamoxifen.  Patient expressed understanding and was in agreement with this plan. She also understands that She can call clinic at any time with any questions, concerns, or complaints.    Carcinoma in situ of breast   Staging form: Breast, AJCC 7th Edition     Clinical: Stage 0 (Tis (DCIS), N0, M0) - Marni Griffon, MD   04/15/2015 10:04 AM

## 2015-05-23 ENCOUNTER — Encounter: Payer: Self-pay | Admitting: Radiation Oncology

## 2015-05-23 ENCOUNTER — Ambulatory Visit
Admission: RE | Admit: 2015-05-23 | Discharge: 2015-05-23 | Disposition: A | Discharge status: Home / Self Care | Payer: 59 | Source: Ambulatory Visit | Attending: Radiation Oncology | Admitting: Radiation Oncology

## 2015-05-23 VITALS — BP 120/81 | HR 84 | Temp 99.6°F | Resp 20 | Wt 120.5 lb

## 2015-05-23 DIAGNOSIS — C50911 Malignant neoplasm of unspecified site of right female breast: Secondary | ICD-10-CM

## 2015-05-23 NOTE — Progress Notes (Signed)
Radiation Oncology Follow up Note  Name: Kaylee Benson   Date:   05/23/2015 MRN:  [id number] DOB: [birth year]    This 44 y.o. female presents to the clinic today for breast cancer stage 0 ER/PR positive ductal carcinoma in situ with bilateral breast implants.  REFERRING PROVIDER: Jerrol Banana.,*  HPI: Patient is a 44 year old female now 1 month out having completed radiation therapy to her right breast for ductal carcinoma in situ ER/PR positive with bilateral breast implants. She seen today one month out and is doing well. She's been started on tamoxifen thinks it's causing some mood swings which I told her is a possibility. She specifically denies breast tenderness cough or bone pain. No feeling of contraction around her right breast implant..  COMPLICATIONS OF TREATMENT: none  FOLLOW UP COMPLIANCE: keeps appointments   PHYSICAL EXAM:  BP 120/81 mmHg  Pulse 84  Temp(Src) 99.6 F (37.6 C)  Resp 20  Wt 120 lb 7.7 oz (54.65 kg) Lungs are clear to A&P cardiac examination essentially unremarkable with regular rate and rhythm. No dominant mass or nodularity is noted in either breast in 2 positions examined. Incision is well-healed. No axillary or supraclavicular adenopathy is appreciated. Cosmetic result is excellent. Patient does have bilateral breast implants. Well-developed well-nourished patient in NAD. HEENT reveals PERLA, EOMI, discs not visualized.  Oral cavity is clear. No oral mucosal lesions are identified. Neck is clear without evidence of cervical or supraclavicular adenopathy. Lungs are clear to A&P. Cardiac examination is essentially unremarkable with regular rate and rhythm without murmur rub or thrill. Abdomen is benign with no organomegaly or masses noted. Motor sensory and DTR levels are equal and symmetric in the upper and lower extremities. Cranial nerves II through XII are grossly intact. Proprioception is intact. No peripheral adenopathy or edema is identified. No  motor or sensory levels are noted. Crude visual fields are within normal range.   RADIOLOGY RESULTS: No recent radiology reports to review  PLAN: At the present time she is doing well 1 month out from external beam radiation therapy. Currently on tamoxifen tolerating that well with some mood changes. I am please were overall progress. I've asked to see around about 5 months for follow-up. Patient knows to call sooner with any concerns.  I would like to take this opportunity for allowing me to participate in the care of your patient.Armstead Peaks., MD

## 2015-09-04 ENCOUNTER — Ambulatory Visit (INDEPENDENT_AMBULATORY_CARE_PROVIDER_SITE_OTHER): Payer: 59 | Admitting: Family Medicine

## 2015-09-04 ENCOUNTER — Encounter: Payer: Self-pay | Admitting: Family Medicine

## 2015-09-04 VITALS — BP 102/62 | HR 78 | Temp 98.0°F | Resp 16 | Ht 60.5 in | Wt 123.0 lb

## 2015-09-04 DIAGNOSIS — F329 Major depressive disorder, single episode, unspecified: Secondary | ICD-10-CM | POA: Diagnosis not present

## 2015-09-04 DIAGNOSIS — R928 Other abnormal and inconclusive findings on diagnostic imaging of breast: Secondary | ICD-10-CM | POA: Insufficient documentation

## 2015-09-04 DIAGNOSIS — Z0000 Encounter for general adult medical examination without abnormal findings: Secondary | ICD-10-CM | POA: Diagnosis not present

## 2015-09-04 DIAGNOSIS — F32A Depression, unspecified: Secondary | ICD-10-CM

## 2015-09-04 DIAGNOSIS — Z87442 Personal history of urinary calculi: Secondary | ICD-10-CM | POA: Insufficient documentation

## 2015-09-04 NOTE — Progress Notes (Signed)
Patient ID: Kaylee Benson, female   DOB: 01-06-1971, 44 y.o.   MRN: [id number]       Patient: Kaylee Benson, Female    DOB: 06/03/1971, 44 y.o.   MRN: [id number] Visit Date: 09/04/2015  Today's Provider: Wilhemena Durie, MD   Chief Complaint  Patient presents with  . Annual Exam   Subjective:    Annual physical exam Kaylee Benson is a 44 y.o. female who presents today for health maintenance and complete physical. She feels well. She reports she is not exercising but she is "always on the run". She reports she is sleeping well.  She reports that she is feeling more tired than normal, but she thinks that it is coming from the Tamoxifen. ----------------------------------------------------------------- Pt sees GYN for her pap and Dr. Jeb Levering is following her for her breast lump. She has an appt coming up with both of them in a few months.    Review of Systems  Constitutional: Positive for diaphoresis, activity change and fatigue.  HENT: Negative.   Eyes: Negative.   Respiratory: Negative.   Cardiovascular: Negative.        Muscle cramps  Gastrointestinal: Negative.   Endocrine: Negative.   Genitourinary: Negative.   Musculoskeletal: Negative.   Skin: Negative.   Allergic/Immunologic: Negative.   Neurological: Negative.   Hematological: Negative.   Psychiatric/Behavioral: Negative.     Social History She  reports that she has never smoked. She does not have any smokeless tobacco history on file. She reports that she drinks alcohol. She reports that she does not use illicit drugs. Social History   Social History  . Marital Status: Married    Spouse Name: N/A  . Number of Children: N/A  . Years of Education: N/A   Social History Main Topics  . Smoking status: Never Smoker   . Smokeless tobacco: Not on file  . Alcohol Use: Yes     Comment: Drinks glass of wine 1-2 a month  . Drug Use: No  . Sexual Activity: Yes    Birth Control/ Protection: Other-see comments   Comment: Essure procedure done 1 year ago, menarche age 16, G50, P 15, maternal age 95-25   Other Topics Concern  . Not on file   Social History Narrative    Patient Active Problem List   Diagnosis Date Noted  . Carcinoma in situ of breast 04/15/2015    Past Surgical History  Procedure Laterality Date  . Breast surgery      bilateral breast implants 2001  . Surgery on left lower leg      2007  . Breast lumpectomy with needle localization Right 01/30/2015    Procedure: BREAST LUMPECTOMY WITH NEEDLE LOCALIZATION;  Surgeon: Erroll Luna, MD;  Location: [place];  Service: General;  Laterality: Right;  . Breast capsulectomy with implant exchange Right 01/30/2015    Procedure: BREAST CAPSULECTOMY WITH IMPLANT EXCHANGE;  Surgeon: Bea Graff, MD;  Location: [place];  Service: Plastics;  Laterality: Right;  . Breast enhancement surgery Bilateral   . Foot surgery Left   . Mouth surgery    . Orif ankle fracture      left    Family History  No family status information on file.   Her family history includes Breast cancer in her mother; Heart disease in her mother; Hypertension in her mother.    No Known Allergies  Previous Medications   BIOTIN PO    Take 1 tablet by mouth.  CALCIUM CARBONATE-VITAMIN D (CALCIUM + D PO)    Take by mouth once.   HYDROCODONE-ACETAMINOPHEN (NORCO/VICODIN) 5-325 MG PER TABLET       MULTIPLE VITAMIN (MULTIVITAMIN) TABLET    Take 1 tablet by mouth daily.   NAPROXEN SODIUM (ANAPROX) 220 MG TABLET    Take 220 mg by mouth as needed.   OXYCODONE-ACETAMINOPHEN (ROXICET) 5-325 MG PER TABLET    Take 1-2 tablets by mouth every 4 (four) hours as needed.   PHENTERMINE 37.5 MG CAPSULE    Take 37.5 mg by mouth every morning. Pt takes 1/2 tab   PROMETHAZINE (PHENERGAN) 25 MG TABLET       TAMOXIFEN (NOLVADEX) 20 MG TABLET    Take 1 tablet (20 mg total) by mouth daily.    Patient Care Team: Jerrol Banana., MD as PCP - General  (Family Medicine)     Objective:   Vitals: There were no vitals taken for this visit.   Physical Exam  Constitutional: She appears well-developed and well-nourished.  HENT:  Head: Normocephalic and atraumatic.  Right Ear: External ear normal.  Left Ear: External ear normal.  Nose: Nose normal.  Eyes: Conjunctivae and EOM are normal. Pupils are equal, round, and reactive to light.  Neck: Neck supple.  Cardiovascular: Normal rate, regular rhythm, normal heart sounds and intact distal pulses.   Pulmonary/Chest: Effort normal and breath sounds normal.  Abdominal: Soft.  Neurological: She is alert.  Skin: Skin is warm and dry.  Psychiatric: She has a normal mood and affect. Her behavior is normal. Judgment and thought content normal.     Depression Screen PHQ 2/9 Scores 05/23/2015  PHQ - 2 Score 0      Assessment & Plan:     Routine Health Maintenance and Physical Exam  Exercise Activities and Dietary recommendations Goals    None       There is no immunization history on file for this patient.  Health Maintenance  Topic Date Due  . HIV Screening  10/07/1986  . TETANUS/TDAP  10/07/1990  . PAP SMEAR  10/07/1992  . INFLUENZA VACCINE  06/24/2015  DCIS Breast cancer. Breast/pelvic/rectal per Gyn. Discussed health benefits of physical activity, and encouraged her to engage in regular exercise appropriate for her age and condition.    --------------------------------------------------------------------

## 2015-09-07 LAB — LIPID PANEL WITH LDL/HDL RATIO
Cholesterol, Total: 181 mg/dL (ref 100–199)
HDL: 61 mg/dL (ref >39)
LDL Calculated: 100 mg/dL — ABNORMAL HIGH (ref 0–99)
LDL/HDL RATIO: 1.6 ratio (ref 0.0–3.2)
Triglycerides: 99 mg/dL (ref 0–149)
VLDL CHOLESTEROL CAL: 20 mg/dL (ref 5–40)

## 2015-09-07 LAB — COMPREHENSIVE METABOLIC PANEL
A/G RATIO: 1.7 (ref 1.1–2.5)
ALK PHOS: 37 IU/L — AB (ref 39–117)
ALT: 13 IU/L (ref 0–32)
AST: 19 IU/L (ref 0–40)
Albumin: 4 g/dL (ref 3.5–5.5)
BILIRUBIN TOTAL: 0.3 mg/dL (ref 0.0–1.2)
BUN / CREAT RATIO: 26 — AB (ref 9–23)
BUN: 21 mg/dL (ref 6–24)
CHLORIDE: 105 mmol/L (ref 97–108)
CO2: 23 mmol/L (ref 18–29)
Calcium: 8.7 mg/dL (ref 8.7–10.2)
Creatinine, Ser: 0.81 mg/dL (ref 0.57–1.00)
GFR calc non Af Amer: 89 mL/min/{1.73_m2} (ref >59)
GFR, EST AFRICAN AMERICAN: 103 mL/min/{1.73_m2} (ref >59)
GLUCOSE: 83 mg/dL (ref 65–99)
Globulin, Total: 2.3 g/dL (ref 1.5–4.5)
Potassium: 4.1 mmol/L (ref 3.5–5.2)
Sodium: 143 mmol/L (ref 134–144)
Total Protein: 6.3 g/dL (ref 6.0–8.5)

## 2015-09-07 LAB — CBC WITH DIFFERENTIAL/PLATELET
BASOS: 0 %
Basophils Absolute: 0 10*3/uL (ref 0.0–0.2)
EOS (ABSOLUTE): 0.1 10*3/uL (ref 0.0–0.4)
Eos: 2 %
HEMATOCRIT: 37.3 % (ref 34.0–46.6)
Hemoglobin: 12.4 g/dL (ref 11.1–15.9)
IMMATURE GRANS (ABS): 0 10*3/uL (ref 0.0–0.1)
IMMATURE GRANULOCYTES: 0 %
LYMPHS: 23 %
Lymphocytes Absolute: 1.1 10*3/uL (ref 0.7–3.1)
MCH: 30 pg (ref 26.6–33.0)
MCHC: 33.2 g/dL (ref 31.5–35.7)
MCV: 90 fL (ref 79–97)
MONOS ABS: 0.5 10*3/uL (ref 0.1–0.9)
Monocytes: 10 %
NEUTROS ABS: 3 10*3/uL (ref 1.4–7.0)
NEUTROS PCT: 65 %
PLATELETS: 256 10*3/uL (ref 150–379)
RBC: 4.13 x10E6/uL (ref 3.77–5.28)
RDW: 13.7 % (ref 12.3–15.4)
WBC: 4.6 10*3/uL (ref 3.4–10.8)

## 2015-09-07 LAB — TSH: TSH: 1.15 u[IU]/mL (ref 0.450–4.500)

## 2015-09-10 ENCOUNTER — Telehealth: Payer: Self-pay

## 2015-09-10 NOTE — Telephone Encounter (Signed)
Advised  ED 

## 2015-09-10 NOTE — Telephone Encounter (Signed)
-----   Message from Jerrol Banana., MD sent at 09/09/2015  2:03 PM EDT ----- Labs normal.

## 2015-10-11 ENCOUNTER — Telehealth: Payer: Self-pay | Admitting: *Deleted

## 2015-10-11 DIAGNOSIS — D0591 Unspecified type of carcinoma in situ of right breast: Secondary | ICD-10-CM

## 2015-10-11 MED ORDER — TAMOXIFEN CITRATE 20 MG PO TABS: 20.0000 mg | ORAL_TABLET | Freq: Every day | ORAL | Status: DC

## 2015-10-11 NOTE — Telephone Encounter (Signed)
E sribed.

## 2015-10-14 ENCOUNTER — Ambulatory Visit: Payer: 59 | Admitting: Oncology

## 2015-10-14 ENCOUNTER — Other Ambulatory Visit: Payer: 59

## 2015-10-23 ENCOUNTER — Inpatient Hospital Stay: Payer: 59 | Attending: Oncology | Admitting: Oncology

## 2015-10-23 ENCOUNTER — Inpatient Hospital Stay: Payer: 59

## 2015-10-23 VITALS — BP 114/75 | HR 77 | Temp 98.2°F | Wt 121.5 lb

## 2015-10-23 DIAGNOSIS — D0511 Intraductal carcinoma in situ of right breast: Secondary | ICD-10-CM | POA: Diagnosis not present

## 2015-10-23 DIAGNOSIS — Z923 Personal history of irradiation: Secondary | ICD-10-CM

## 2015-10-23 DIAGNOSIS — Z803 Family history of malignant neoplasm of breast: Secondary | ICD-10-CM | POA: Diagnosis not present

## 2015-10-23 DIAGNOSIS — Z79810 Long term (current) use of selective estrogen receptor modulators (SERMs): Secondary | ICD-10-CM | POA: Diagnosis not present

## 2015-10-23 DIAGNOSIS — D0591 Unspecified type of carcinoma in situ of right breast: Secondary | ICD-10-CM

## 2015-10-23 DIAGNOSIS — R232 Flushing: Secondary | ICD-10-CM | POA: Diagnosis not present

## 2015-10-23 DIAGNOSIS — Z79899 Other long term (current) drug therapy: Secondary | ICD-10-CM | POA: Diagnosis not present

## 2015-10-23 DIAGNOSIS — Z87442 Personal history of urinary calculi: Secondary | ICD-10-CM

## 2015-10-23 DIAGNOSIS — Z170 Estrogen receptor positive status [ER+]: Secondary | ICD-10-CM

## 2015-10-23 LAB — COMPREHENSIVE METABOLIC PANEL
ALBUMIN: 4 g/dL (ref 3.5–5.0)
ALK PHOS: 35 U/L — AB (ref 38–126)
ALT: 17 U/L (ref 14–54)
AST: 22 U/L (ref 15–41)
Anion gap: 8 (ref 5–15)
BUN: 26 mg/dL — AB (ref 6–20)
CALCIUM: 8.8 mg/dL — AB (ref 8.9–10.3)
CO2: 23 mmol/L (ref 22–32)
CREATININE: 0.78 mg/dL (ref 0.44–1.00)
Chloride: 105 mmol/L (ref 101–111)
GFR calc Af Amer: >60 mL/min (ref >60)
GLUCOSE: 93 mg/dL (ref 65–99)
Potassium: 3.4 mmol/L — ABNORMAL LOW (ref 3.5–5.1)
Sodium: 136 mmol/L (ref 135–145)
TOTAL PROTEIN: 7.1 g/dL (ref 6.5–8.1)
Total Bilirubin: 0.6 mg/dL (ref 0.3–1.2)

## 2015-10-23 LAB — CBC WITH DIFFERENTIAL/PLATELET
BASOS ABS: 0 10*3/uL (ref 0–0.1)
Basophils Relative: 0 %
EOS PCT: 1 %
Eosinophils Absolute: 0 10*3/uL (ref 0–0.7)
HCT: 36 % (ref 35.0–47.0)
HEMOGLOBIN: 11.9 g/dL — AB (ref 12.0–16.0)
Lymphocytes Relative: 13 %
Lymphs Abs: 0.8 10*3/uL — ABNORMAL LOW (ref 1.0–3.6)
MCH: 28.7 pg (ref 26.0–34.0)
MCHC: 33 g/dL (ref 32.0–36.0)
MCV: 87.1 fL (ref 80.0–100.0)
MONOS PCT: 8 %
Monocytes Absolute: 0.5 10*3/uL (ref 0.2–0.9)
Neutro Abs: 4.7 10*3/uL (ref 1.4–6.5)
Neutrophils Relative %: 78 %
Platelets: 241 10*3/uL (ref 150–440)
RBC: 4.13 MIL/uL (ref 3.80–5.20)
RDW: 13.6 % (ref 11.5–14.5)
WBC: 6 10*3/uL (ref 3.6–11.0)

## 2015-10-23 MED ORDER — TAMOXIFEN CITRATE 20 MG PO TABS: 20.0000 mg | ORAL_TABLET | Freq: Every day | ORAL | Status: DC

## 2015-10-23 NOTE — Progress Notes (Signed)
[place]  [phone]  [phone]     Kaylee Benson DOB: [birth year]  MR#: [id number]  JJH#:[id number]  Patient Care Team: Jerrol Banana., MD as PCP - General (Family Medicine)  CHIEF COMPLAINT:  Chief Complaint  Patient presents with  . Breast Cancer   DCIS.  INTERVAL HISTORY:  Patient is here for continued follow-up and treatment consideration regarding DCIS of the right breast. Patient was originally diagnosed in early 2016 following an abnormal mammogram, with subsequent biopsy and lumpectomy, and radiation therapy. Of note patient also with strong family history of breast cancer as well as bilateral breast implants. She overall reports feeling very well. She is currently taking tamoxifen daily. She reports having some mild hot flashes intermittently but no other complaints or issues. She also reports performing routine self breast exams.   REVIEW OF SYSTEMS:   Review of Systems  Constitutional: Negative for fever, chills, weight loss, malaise/fatigue and diaphoresis.  HENT: Negative for congestion, ear discharge, ear pain, hearing loss, nosebleeds, sore throat and tinnitus.   Eyes: Negative for blurred vision, double vision, photophobia, pain, discharge and redness.  Respiratory: Negative for cough, hemoptysis, sputum production, shortness of breath, wheezing and stridor.   Cardiovascular: Negative for chest pain, palpitations, orthopnea, claudication, leg swelling and PND.  Gastrointestinal: Negative for heartburn, nausea, vomiting, abdominal pain, diarrhea, constipation, blood in stool and melena.  Genitourinary: Negative.   Musculoskeletal: Negative.   Skin: Negative.   Neurological: Negative for dizziness, tingling, focal weakness, seizures, weakness and headaches.  Endo/Heme/Allergies: Does not bruise/bleed easily.  Psychiatric/Behavioral: Negative for depression. The patient is not nervous/anxious and does not have insomnia.      As per HPI. Otherwise, a complete review of systems is negatve.   PAST MEDICAL HISTORY: Past Medical History  Diagnosis Date  . Kidney stones   . PONV (postoperative nausea and vomiting)     nausea  . Allergy     seasonal  . Kidney stone   . Breast cancer ([place])   . Carcinoma in situ of breast 04/15/2015    PAST SURGICAL HISTORY: Past Surgical History  Procedure Laterality Date  . Breast surgery      bilateral breast implants 2001  . Surgery on left lower leg      2007  . Breast lumpectomy with needle localization Right 01/30/2015    Procedure: BREAST LUMPECTOMY WITH NEEDLE LOCALIZATION;  Surgeon: Erroll Luna, MD;  Location: [facility];  Service: General;  Laterality: Right;  . Breast capsulectomy with implant exchange Right 01/30/2015    Procedure: BREAST CAPSULECTOMY WITH IMPLANT EXCHANGE;  Surgeon: Bea Graff, MD;  Location: [facility];  Service: Plastics;  Laterality: Right;  . Breast enhancement surgery Bilateral   . Foot surgery Left   . Mouth surgery    . Orif ankle fracture      left    FAMILY HISTORY Family History  Problem Relation Age of Onset  . Breast cancer Mother   . Heart disease Mother   . Hypertension Mother     GYNECOLOGIC HISTORY:  No LMP recorded.     ADVANCED DIRECTIVES:    HEALTH MAINTENANCE: Social History  Substance Use Topics  . Smoking status: Never Smoker   . Smokeless tobacco: Not on file  . Alcohol Use: Yes     Comment: Drinks glass of wine 1-2 a month     Colonoscopy:  PAP:  Bone density:  Lipid panel:  No Known Allergies  Current Outpatient Prescriptions  Medication Sig Dispense Refill  . BIOTIN PO Take 1 tablet by mouth.    . Calcium Carbonate-Vitamin D (CALCIUM + D PO) Take by mouth once.    . Multiple Vitamin (MULTIVITAMIN) tablet Take 1 tablet by mouth daily.    . naproxen sodium (ANAPROX) 220 MG tablet Take 220 mg by mouth as needed.    . tamoxifen (NOLVADEX) 20 MG tablet Take 1  tablet (20 mg total) by mouth daily. 30 tablet 5   No current facility-administered medications for this visit.    OBJECTIVE: BP 114/75 mmHg  Pulse 77  Temp(Src) 98.2 F (36.8 C) (Tympanic)  Wt 121 lb 7.6 oz (55.1 kg)   Body mass index is 23.32 kg/(m^2).    ECOG FS:0 - Asymptomatic  General: Well-developed, well-nourished, no acute distress. Eyes: Pink conjunctiva, anicteric sclera. HEENT: Normocephalic, moist mucous membranes, clear oropharnyx. Lungs: Clear to auscultation bilaterally. Heart: Regular rate and rhythm. No rubs, murmurs, or gallops. Abdomen: Soft, nontender, nondistended. No organomegaly noted, normoactive bowel sounds. Breast: Bilateral breast implants noted. Right lumpectomy site well-healed scarring present. Breast palpated in a circular manner in the sitting and supine positions.  No masses or fullness palpated.  Axilla palpated in both positions with no masses or fullness palpated.  Musculoskeletal: No edema, cyanosis, or clubbing. Neuro: Alert, answering all questions appropriately. Cranial nerves grossly intact. Skin: No rashes or petechiae noted. Psych: Normal affect. Lymphatics: No cervical or clavicular LAD.   LAB RESULTS:  Appointment on 10/23/2015  Component Date Value Ref Range Status  . WBC 10/23/2015 6.0  3.6 - 11.0 K/uL Final  . RBC 10/23/2015 4.13  3.80 - 5.20 MIL/uL Final  . Hemoglobin 10/23/2015 11.9* 12.0 - 16.0 g/dL Final  . HCT 10/23/2015 36.0  35.0 - 47.0 % Final  . MCV 10/23/2015 87.1  80.0 - 100.0 fL Final  . MCH 10/23/2015 28.7  26.0 - 34.0 pg Final  . MCHC 10/23/2015 33.0  32.0 - 36.0 g/dL Final  . RDW 10/23/2015 13.6  11.5 - 14.5 % Final  . Platelets 10/23/2015 241  150 - 440 K/uL Final  . Neutrophils Relative % 10/23/2015 78   Final  . Neutro Abs 10/23/2015 4.7  1.4 - 6.5 K/uL Final  . Lymphocytes Relative 10/23/2015 13   Final  . Lymphs Abs 10/23/2015 0.8* 1.0 - 3.6 K/uL Final  . Monocytes Relative 10/23/2015 8   Final  .  Monocytes Absolute 10/23/2015 0.5  0.2 - 0.9 K/uL Final  . Eosinophils Relative 10/23/2015 1   Final  . Eosinophils Absolute 10/23/2015 0.0  0 - 0.7 K/uL Final  . Basophils Relative 10/23/2015 0   Final  . Basophils Absolute 10/23/2015 0.0  0 - 0.1 K/uL Final    STUDIES: No results found.  ASSESSMENT:  DCIS.  PLAN:  1. Carcinoma of right breast, ductal carcinoma in situ type. Patient is status post lumpectomy as well as radiation therapy. BRCA mutation studies were reported as negative as well as my risk. Patient is currently on tamoxifen 20 mg daily and tolerating well. She does have some intermittent hot flashes but overall feels very well. She has her routine GYN checkup. Patient also follows closely with primary care provider Dr. Rosanna Randy. Her most recent mammogram was upon diagnosis in early 2016. She is due for follow-up mammogram which she states her GYN or primary care provider will order.  We will continue with routine follow-up in 6 months.  Patient expressed understanding and was in agreement  with this plan. She also understands that She can call clinic at any time with any questions, concerns, or complaints.   Dr. Oliva Bustard was available for consultation and review of plan of care for this patient.  Carcinoma in situ of breast   Staging form: Breast, AJCC 7th Edition     Clinical: Stage 0 (Tis (DCIS), N0, M0) - Unsigned   Evlyn Kanner, NP   10/23/2015 12:22 PM

## 2015-10-23 NOTE — Progress Notes (Signed)
Patient fell in her garage this morning prior to coming to this appointment.  She has a hematoma on the right side of the back of her head.  States she has a headache 5/10.  Also tried to break her fall with her left hand and states her wrist is sore as well.  Requesting refill on Tamoxifen.

## 2015-10-24 ENCOUNTER — Ambulatory Visit (INDEPENDENT_AMBULATORY_CARE_PROVIDER_SITE_OTHER): Payer: 59 | Admitting: Family Medicine

## 2015-10-24 VITALS — BP 100/50 | HR 80 | Temp 98.1°F | Resp 16 | Wt 121.0 lb

## 2015-10-24 DIAGNOSIS — S0990XA Unspecified injury of head, initial encounter: Secondary | ICD-10-CM | POA: Diagnosis not present

## 2015-10-24 DIAGNOSIS — S060X0A Concussion without loss of consciousness, initial encounter: Secondary | ICD-10-CM

## 2015-10-24 NOTE — Patient Instructions (Signed)
Concussion, Adult  A concussion, or closed-head injury, is a brain injury caused by a direct blow to the head or by a quick and sudden movement (jolt) of the head or neck. Concussions are usually not life-threatening. Even so, the effects of a concussion can be serious. If you have had a concussion before, you are more likely to experience concussion-like symptoms after a direct blow to the head.   CAUSES  · Direct blow to the head, such as from running into another player during a soccer game, being hit in a fight, or hitting your head on a hard surface.  · A jolt of the head or neck that causes the brain to move back and forth inside the skull, such as in a car crash.  SIGNS AND SYMPTOMS  The signs of a concussion can be hard to notice. Early on, they may be missed by you, family members, and health care providers. You may look fine but act or feel differently.  Symptoms are usually temporary, but they may last for days, weeks, or even longer. Some symptoms may appear right away while others may not show up for hours or days. Every head injury is different. Symptoms include:  · Mild to moderate headaches that will not go away.  · A feeling of pressure inside your head.  · Having more trouble than usual:    Learning or remembering things you have heard.    Answering questions.    Paying attention or concentrating.    Organizing daily tasks.    Making decisions and solving problems.  · Slowness in thinking, acting or reacting, speaking, or reading.  · Getting lost or being easily confused.  · Feeling tired all the time or lacking energy (fatigued).  · Feeling drowsy.  · Sleep disturbances.    Sleeping more than usual.    Sleeping less than usual.    Trouble falling asleep.    Trouble sleeping (insomnia).  · Loss of balance or feeling lightheaded or dizzy.  · Nausea or vomiting.  · Numbness or tingling.  · Increased sensitivity to:    Sounds.    Lights.    Distractions.  · Vision problems or eyes that tire  easily.  · Diminished sense of taste or smell.  · Ringing in the ears.  · Mood changes such as feeling sad or anxious.  · Becoming easily irritated or angry for little or no reason.  · Lack of motivation.  · Seeing or hearing things other people do not see or hear (hallucinations).  DIAGNOSIS  Your health care provider can usually diagnose a concussion based on a description of your injury and symptoms. He or she will ask whether you passed out (lost consciousness) and whether you are having trouble remembering events that happened right before and during your injury.  Your evaluation might include:  · A brain scan to look for signs of injury to the brain. Even if the test shows no injury, you may still have a concussion.  · Blood tests to be sure other problems are not present.  TREATMENT  · Concussions are usually treated in an emergency department, in urgent care, or at a clinic. You may need to stay in the hospital overnight for further treatment.  · Tell your health care provider if you are taking any medicines, including prescription medicines, over-the-counter medicines, and natural remedies. Some medicines, such as blood thinners (anticoagulants) and aspirin, may increase the chance of complications. Also tell your health care   provider whether you have had alcohol or are taking illegal drugs. This information may affect treatment.  · Your health care provider will send you home with important instructions to follow.  · How fast you will recover from a concussion depends on many factors. These factors include how severe your concussion is, what part of your brain was injured, your age, and how healthy you were before the concussion.  · Most people with mild injuries recover fully. Recovery can take time. In general, recovery is slower in older persons. Also, persons who have had a concussion in the past or have other medical problems may find that it takes longer to recover from their current injury.  HOME  CARE INSTRUCTIONS  General Instructions  · Carefully follow the directions your health care provider gave you.  · Only take over-the-counter or prescription medicines for pain, discomfort, or fever as directed by your health care provider.  · Take only those medicines that your health care provider has approved.  · Do not drink alcohol until your health care provider says you are well enough to do so. Alcohol and certain other drugs may slow your recovery and can put you at risk of further injury.  · If it is harder than usual to remember things, write them down.  · If you are easily distracted, try to do one thing at a time. For example, do not try to watch TV while fixing dinner.  · Talk with family members or close friends when making important decisions.  · Keep all follow-up appointments. Repeated evaluation of your symptoms is recommended for your recovery.  · Watch your symptoms and tell others to do the same. Complications sometimes occur after a concussion. Older adults with a brain injury may have a higher risk of serious complications, such as a blood clot on the brain.  · Tell your teachers, school nurse, school counselor, coach, athletic trainer, or work manager about your injury, symptoms, and restrictions. Tell them about what you can or cannot do. They should watch for:    Increased problems with attention or concentration.    Increased difficulty remembering or learning new information.    Increased time needed to complete tasks or assignments.    Increased irritability or decreased ability to cope with stress.    Increased symptoms.  · Rest. Rest helps the brain to heal. Make sure you:    Get plenty of sleep at night. Avoid staying up late at night.    Keep the same bedtime hours on weekends and weekdays.    Rest during the day. Take daytime naps or rest breaks when you feel tired.  · Limit activities that require a lot of thought or concentration. These include:    Doing homework or job-related  work.    Watching TV.    Working on the computer.  · Avoid any situation where there is potential for another head injury (football, hockey, soccer, basketball, martial arts, downhill snow sports and horseback riding). Your condition will get worse every time you experience a concussion. You should avoid these activities until you are evaluated by the appropriate follow-up health care providers.  Returning To Your Regular Activities  You will need to return to your normal activities slowly, not all at once. You must give your body and brain enough time for recovery.  · Do not return to sports or other athletic activities until your health care provider tells you it is safe to do so.  · Ask   your health care provider when you can drive, ride a bicycle, or operate heavy machinery. Your ability to react may be slower after a brain injury. Never do these activities if you are dizzy.  · Ask your health care provider about when you can return to work or school.  Preventing Another Concussion  It is very important to avoid another brain injury, especially before you have recovered. In rare cases, another injury can lead to permanent brain damage, brain swelling, or death. The risk of this is greatest during the first 7-10 days after a head injury. Avoid injuries by:  · Wearing a seat belt when riding in a car.  · Drinking alcohol only in moderation.  · Wearing a helmet when biking, skiing, skateboarding, skating, or doing similar activities.  · Avoiding activities that could lead to a second concussion, such as contact or recreational sports, until your health care provider says it is okay.  · Taking safety measures in your home.    Remove clutter and tripping hazards from floors and stairways.    Use grab bars in bathrooms and handrails by stairs.    Place non-slip mats on floors and in bathtubs.    Improve lighting in dim areas.  SEEK MEDICAL CARE IF:  · You have increased problems paying attention or  concentrating.  · You have increased difficulty remembering or learning new information.  · You need more time to complete tasks or assignments than before.  · You have increased irritability or decreased ability to cope with stress.  · You have more symptoms than before.  Seek medical care if you have any of the following symptoms for more than 2 weeks after your injury:  · Lasting (chronic) headaches.  · Dizziness or balance problems.  · Nausea.  · Vision problems.  · Increased sensitivity to noise or light.  · Depression or mood swings.  · Anxiety or irritability.  · Memory problems.  · Difficulty concentrating or paying attention.  · Sleep problems.  · Feeling tired all the time.  SEEK IMMEDIATE MEDICAL CARE IF:  · You have severe or worsening headaches. These may be a sign of a blood clot in the brain.  · You have weakness (even if only in one hand, leg, or part of the face).  · You have numbness.  · You have decreased coordination.  · You vomit repeatedly.  · You have increased sleepiness.  · One pupil is larger than the other.  · You have convulsions.  · You have slurred speech.  · You have increased confusion. This may be a sign of a blood clot in the brain.  · You have increased restlessness, agitation, or irritability.  · You are unable to recognize people or places.  · You have neck pain.  · It is difficult to wake you up.  · You have unusual behavior changes.  · You lose consciousness.  MAKE SURE YOU:  · Understand these instructions.  · Will watch your condition.  · Will get help right away if you are not doing well or get worse.     This information is not intended to replace advice given to you by your health care provider. Make sure you discuss any questions you have with your health care provider.     Document Released: 01/30/2004 Document Revised: 11/30/2014 Document Reviewed: 06/01/2013  Elsevier Interactive Patient Education ©2016 [employer]

## 2015-10-24 NOTE — Progress Notes (Signed)
Patient ID: Kaylee Benson, female   DOB: May 12, 1971, 44 y.o.   MRN: [id number]   Kaylee Benson  MRN: [id number] DOB: 1971-03-03  Subjective:  HPI  1. Head injury, initial encounter Patient is a 44 year old female who presents today for evaluation of head injury from a fall that occurred 24 hours ago.  Patient states she was walking into her garage while wearing flip flops and when she stepped onto the wet floor her feet slid out from under her causing her to fall backwards.  She landed on her left buttocks, slightly catching herself on her left wrist and hitting her the back of her head directly on the concrete.  She did not have any LOC.  She was on her way to the oncology office and had the nurse there check her.  She developed a goosegg on the back of her head, started bruising on her wrist.  No other areas of the fall injuries were checked at that time.  She immediately after the fall developed headache, about 2-3 hours after the fall she started having neck stiffness and tightness.  She has since developed bruises on her left wrist and buttocks.  She has also developed pain in her right ear and around the right eye area.  No visual changes or hearing loss.  She states if feels stopped up and dull pain.   Patient Active Problem List   Diagnosis Date Noted  . Abnormal finding on mammography 09/04/2015  . Personal history of urinary calculi 09/04/2015  . Depression 09/04/2015  . 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 Allergic rhinitis 02/16/2008    Past Medical History  Diagnosis Date  . Kidney stones   . PONV (postoperative nausea and vomiting)     nausea  . Allergy     seasonal  . Kidney stone   . Breast cancer ([place])   . Carcinoma in situ of breast 04/15/2015    Social History   Social History  . Marital Status: Married    Spouse Name: N/A  . Number of Children: N/A  . Years of Education: N/A   Occupational History  . Not on file.   Social History Main Topics  .  Smoking status: Never Smoker   . Smokeless tobacco: Not on file  . Alcohol Use: Yes     Comment: Drinks glass of wine 1-2 a month  . Drug Use: No  . Sexual Activity: Yes    Birth Control/ Protection: Other-see comments     Comment: Essure procedure done 1 year ago, menarche age 66, G31, P 76, maternal age 14-25   Other Topics Concern  . Not on file   Social History Narrative    Outpatient Prescriptions Prior to Visit  Medication Sig Dispense Refill  . BIOTIN PO Take 1 tablet by mouth.    . Calcium Carbonate-Vitamin D (CALCIUM + D PO) Take by mouth once.    . Multiple Vitamin (MULTIVITAMIN) tablet Take 1 tablet by mouth daily.    . naproxen sodium (ANAPROX) 220 MG tablet Take 220 mg by mouth as needed.    . tamoxifen (NOLVADEX) 20 MG tablet Take 1 tablet (20 mg total) by mouth daily. 30 tablet 5   No facility-administered medications prior to visit.    No Known Allergies  Review of Systems  Constitutional: Negative for fever, chills, malaise/fatigue and diaphoresis.       Patient was very tired yesterday but has been for a while.  HENT: Positive for ear pain. Negative for ear discharge, hearing loss and tinnitus.   Eyes: Negative for blurred vision, double vision, photophobia, pain, discharge and redness.  Respiratory: Negative for cough, shortness of breath and wheezing.   Cardiovascular: Negative for chest pain, orthopnea and leg swelling.  Musculoskeletal: Positive for myalgias and falls.  Neurological: Positive for dizziness (Yesterday , none today.) and headaches.   Objective:  BP 100/50 mmHg  Pulse 80  Temp(Src) 98.1 F (36.7 C) (Oral)  Resp 16  Wt 121 lb (54.885 kg)  Physical Exam  Constitutional: She is oriented to person, place, and time and well-developed, well-nourished, and in no distress.  HENT:  Head: Normocephalic.  Right Ear: External ear normal.  Left Ear: External ear normal.  Nose: Nose normal.  Mouth/Throat: Oropharynx is clear and moist.  Very  mild swelling of the right occiput   Eyes: Conjunctivae and EOM are normal. Pupils are equal, round, and reactive to light.  Neck: Normal range of motion. Neck supple.  Cardiovascular: Normal rate, regular rhythm and normal heart sounds.   Pulmonary/Chest: Effort normal and breath sounds normal.  Abdominal: Soft.  Musculoskeletal: Normal range of motion.  Contusion of left wrist with normal ROM and minimal tenderness. Muscular soreness of trunk and buttocks in addition to limbs.No tenderness of cervical spine at all.  Neurological: She is alert and oriented to person, place, and time. Gait normal. GCS score is 15.  Gait normal. Balance good. Cognition good. Memory ok.  Skin: Skin is warm and dry.  Psychiatric: Mood, memory, affect and judgment normal.    Assessment and Plan :   1. Head injury, initial encounter No imaging needed. More than 50% of time spent in counselling regarding concussion. 2. Concussion, without loss of consciousness, initial encounter 3.Contusion left wrist. Tylenol for pain.  Ice to painful areas today, heat starting tomorrow. I have done the exam and reviewed the above chart and it is accurate to the best of my knowledge.  Miguel Aschoff MD [facility] 10/24/2015 11:19 AM

## 2015-11-08 ENCOUNTER — Other Ambulatory Visit: Payer: Self-pay | Admitting: Oncology

## 2015-11-27 ENCOUNTER — Encounter: Payer: Self-pay | Admitting: Radiation Oncology

## 2015-11-27 ENCOUNTER — Ambulatory Visit
Admission: RE | Admit: 2015-11-27 | Discharge: 2015-11-27 | Disposition: A | Discharge status: Home / Self Care | Payer: BLUE CROSS/BLUE SHIELD | Source: Ambulatory Visit | Attending: Radiation Oncology | Admitting: Radiation Oncology

## 2015-11-27 VITALS — BP 114/73 | HR 85 | Temp 98.1°F | Resp 20 | Wt 124.8 lb

## 2015-11-27 DIAGNOSIS — D0591 Unspecified type of carcinoma in situ of right breast: Secondary | ICD-10-CM

## 2015-11-27 NOTE — Progress Notes (Signed)
Radiation Oncology Follow up Note  Name: Kaylee Benson   Date:   11/27/2015 MRN:  [id number] DOB: [birth year]    This 45 y.o. 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  REFERRING PROVIDER: Jerrol Banana.,*  HPI: Patient is a 45 year old female now out 7 months having completed radiation therapy to her right breast for ductal carcinoma in situ ER/PR positive. She is seen today in routine follow-up and is doing well. She's not yet had a mammogram I've asked her to contact her surgeon to see if they want to do it in [place] or week and ordered here to pending on her preference. She specifically denies breast tenderness cough or bone pain. She also denies any contraction around her breast implants. She currently is on tamoxifen tolerating that well without side effect..  COMPLICATIONS OF TREATMENT: none  FOLLOW UP COMPLIANCE: keeps appointments   PHYSICAL EXAM:  BP 114/73 mmHg  Pulse 85  Temp(Src) 98.1 F (36.7 C)  Resp 20  Wt 124 lb 12.5 oz (56.6 kg) Lungs are clear to A&P cardiac examination essentially unremarkable with regular rate and rhythm. No dominant mass or nodularity is noted in either breast in 2 positions examined. Incision is well-healed. No axillary or supraclavicular adenopathy is appreciated. Cosmetic result is excellent. Patient does have bilateral breast implants no evidence of contracture.  RADIOLOGY RESULTS: Patient will be scheduling follow-up mammograms  PLAN: Present time she is doing well with no evidence of disease. I'm please were overall progress. She continues on tamoxifen without side effect. I've asked to see her back in 1 year for follow-up. Patient will contact us should she decide to have her mammograms here in [place].  I would like to take this opportunity for allowing me to participate in the care of your patient.Armstead Peaks., MD

## 2015-12-06 ENCOUNTER — Telehealth: Payer: Self-pay | Admitting: *Deleted

## 2015-12-06 NOTE — Telephone Encounter (Addendum)
Reports since Wednesday she has been having H/A, "heart thumping", waking at night with a feeling of having the breath knocked out of her. Having intermittent chest pain. Will also need a mammogram ordered. Per Dr Oliva Bustard, She is to stop Tamoxifen, and if sx persist go to ER over weekend. Appt scheduled for 1/19 at 8AM. She repeated this back to me and agrees to appt 1/19

## 2015-12-12 ENCOUNTER — Inpatient Hospital Stay: Payer: BLUE CROSS/BLUE SHIELD | Attending: Oncology | Admitting: Oncology

## 2015-12-12 ENCOUNTER — Other Ambulatory Visit: Payer: Self-pay | Admitting: Family Medicine

## 2015-12-12 ENCOUNTER — Encounter: Payer: Self-pay | Admitting: Oncology

## 2015-12-12 ENCOUNTER — Ambulatory Visit
Admission: RE | Admit: 2015-12-12 | Discharge: 2015-12-12 | Disposition: A | Discharge status: Home / Self Care | Payer: BLUE CROSS/BLUE SHIELD | Source: Ambulatory Visit | Attending: Oncology | Admitting: Oncology

## 2015-12-12 VITALS — BP 125/80 | HR 82 | Temp 97.7°F | Resp 18 | Wt 122.1 lb

## 2015-12-12 DIAGNOSIS — R0602 Shortness of breath: Secondary | ICD-10-CM | POA: Diagnosis not present

## 2015-12-12 DIAGNOSIS — N939 Abnormal uterine and vaginal bleeding, unspecified: Secondary | ICD-10-CM | POA: Insufficient documentation

## 2015-12-12 DIAGNOSIS — Z79899 Other long term (current) drug therapy: Secondary | ICD-10-CM | POA: Insufficient documentation

## 2015-12-12 DIAGNOSIS — R232 Flushing: Secondary | ICD-10-CM | POA: Insufficient documentation

## 2015-12-12 DIAGNOSIS — Z853 Personal history of malignant neoplasm of breast: Secondary | ICD-10-CM | POA: Insufficient documentation

## 2015-12-12 DIAGNOSIS — D0591 Unspecified type of carcinoma in situ of right breast: Secondary | ICD-10-CM | POA: Diagnosis present

## 2015-12-12 DIAGNOSIS — R079 Chest pain, unspecified: Secondary | ICD-10-CM | POA: Insufficient documentation

## 2015-12-12 DIAGNOSIS — K760 Fatty (change of) liver, not elsewhere classified: Secondary | ICD-10-CM | POA: Diagnosis not present

## 2015-12-12 DIAGNOSIS — Z87442 Personal history of urinary calculi: Secondary | ICD-10-CM | POA: Insufficient documentation

## 2015-12-12 DIAGNOSIS — Z9882 Breast implant status: Secondary | ICD-10-CM | POA: Insufficient documentation

## 2015-12-12 DIAGNOSIS — Z803 Family history of malignant neoplasm of breast: Secondary | ICD-10-CM

## 2015-12-12 DIAGNOSIS — Z9013 Acquired absence of bilateral breasts and nipples: Secondary | ICD-10-CM | POA: Diagnosis not present

## 2015-12-12 DIAGNOSIS — R002 Palpitations: Secondary | ICD-10-CM

## 2015-12-12 LAB — POCT I-STAT CREATININE: CREATININE: 0.9 mg/dL (ref 0.44–1.00)

## 2015-12-12 MED ORDER — IOHEXOL 350 MG/ML SOLN
75.0000 mL | Freq: Once | INTRAVENOUS | Status: AC | PRN
  Administered 2015-12-12: 75 mL via INTRAVENOUS

## 2015-12-12 NOTE — Progress Notes (Signed)
[place]  [phone]) [phone]  [phone]     Kaylee Benson DOB: 11-Oct-1971  MR#: [id number]  LDJ#:[id number]  Patient Care Team: Jerrol Banana., MD as PCP - General (Family Medicine)  CHIEF COMPLAINT:  Chief Complaint  Patient presents with  . Breast Cancer   DCIS.  INTERVAL HISTORY:  Patient is here for continued follow-up and treatment consideration regarding DCIS of the right breast. Patient was originally diagnosed in early 2016 following an abnormal mammogram, with subsequent biopsy and lumpectomy, and radiation therapy. Of note patient also with strong family history of breast cancer as well as bilateral breast implants. She overall reports feeling very well. She is currently taking tamoxifen daily. She reports having some mild hot flashes intermittently but no other complaints or issues. She also reports performing routine self breast exams.  December 12, 2015. Patient has had some SOB and chest pain while on Tamoxifen. She further states she has had some vaginal bleeding off and on and has been spotting since 'Sunday. She feels sluggish and tired a lot. Patient stopped Tamoxifen last Thursday. Has an OB appointment coming up soon. Also states it is time for mammogram and would like that scheduled through [facility]. Patient states she also has a mole under her left breast that the PA told her to have checked.    REVIEW OF SYSTEMS:   Review of Systems  Constitutional: Negative for fever, chills, weight loss, malaise/fatigue and diaphoresis.  HENT: Negative for congestion, ear discharge, ear pain, hearing loss, nosebleeds, sore throat and tinnitus.   Eyes: Negative for blurred vision, double vision, photophobia, pain, discharge and redness.  Respiratory: Negative for cough, hemoptysis, sputum production, shortness of breath, wheezing and stridor.   Cardiovascular: Negative for chest pain, palpitations, orthopnea, claudication, leg swelling and PND.    Gastrointestinal: Negative for heartburn, nausea, vomiting, abdominal pain, diarrhea, constipation, blood in stool and melena.  Genitourinary: Negative.   Musculoskeletal: Negative.   Skin: Negative.   Neurological: Negative for dizziness, tingling, focal weakness, seizures, weakness and headaches.  Endo/Heme/Allergies: Does not bruise/bleed easily.  Psychiatric/Behavioral: Negative for depression. The patient is not nervous/anxious and does not have insomnia.     As per HPI. Otherwise, a complete review of systems is negatve.   PAST MEDICAL HISTORY: Past Medical History  Diagnosis Date  . Kidney stones   . PONV (postoperative nausea and vomiting)     nausea  . Allergy     seasonal  . Kidney stone   . Breast cancer (HCC)   . Carcinoma in situ of breast 04/15/2015    PAST SURGICAL HISTORY: Past Surgical History  Procedure Laterality Date  . Breast surgery      bilateral breast implants 2001  . Surgery on left lower leg      20'$ 07  . Breast lumpectomy with needle localization Right 01/30/2015    Procedure: BREAST LUMPECTOMY WITH NEEDLE LOCALIZATION;  Surgeon: Erroll Luna, MD;  Location: [facility];  Service: General;  Laterality: Right;  . Breast capsulectomy with implant exchange Right 01/30/2015    Procedure: BREAST CAPSULECTOMY WITH IMPLANT EXCHANGE;  Surgeon: Bea Graff, MD;  Location: [facility];  Service: Plastics;  Laterality: Right;  . Breast enhancement surgery Bilateral   . Foot surgery Left   . Mouth surgery    . Orif ankle fracture      left    FAMILY HISTORY Family History  Problem Relation Age of Onset  . Breast cancer Mother   .  Heart disease Mother   . Hypertension Mother     GYNECOLOGIC HISTORY:  No LMP recorded.  Had some vaginal spotting   ADVANCED DIRECTIVES:  Patient does not have any living will or healthcare power of attorney.  Information was given .  Available resources had been discussed.  We will follow-up on  subsequent appointments regarding this issue  HEALTH MAINTENANCE: Social History  Substance Use Topics  . Smoking status: Never Smoker   . Smokeless tobacco: None  . Alcohol Use: Yes     Comment: Drinks glass of wine 1-2 a month      No Known Allergies  Current Outpatient Prescriptions  Medication Sig Dispense Refill  . BIOTIN PO Take 1 tablet by mouth.    . Calcium Carbonate-Vitamin D (CALCIUM + D PO) Take by mouth once.    . Multiple Vitamin (MULTIVITAMIN) tablet Take 1 tablet by mouth daily.    . naproxen sodium (ANAPROX) 220 MG tablet Take 220 mg by mouth as needed.     No current facility-administered medications for this visit.    OBJECTIVE: BP 125/80 mmHg  Pulse 82  Temp(Src) 97.7 F (36.5 C) (Tympanic)  Resp 18  Wt 122 lb 2.2 oz (55.4 kg)   Body mass index is 23.45 kg/(m^2).    ECOG FS:0 - Asymptomatic Patient does not have any living will or healthcare power of attorney.  Information was given .  Available resources had been discussed.  We will follow-up on subsequent appointments regarding this issue General: Well-developed, well-nourished, no acute distress. Eyes: Pink conjunctiva, anicteric sclera. HEENT: Normocephalic, moist mucous membranes, clear oropharnyx. Lungs: Clear to auscultation bilaterally. Heart: Regular rate and rhythm. No rubs, murmurs, or gallops. Abdomen: Soft, nontender, nondistended. No organomegaly noted, normoactive bowel sounds. Breast: Bilateral breast implants noted. Right lumpectomy site well-healed scarring present. Breast palpated in a circular manner in the sitting and supine positions.  No masses or fullness palpated.  Axilla palpated in both positions with no masses or fullness palpated.  Musculoskeletal: No edema, cyanosis, or clubbing. Neuro: Alert, answering all questions appropriately. Cranial nerves grossly intact. Skin: No rashes or petechiae noted. Psych: Normal affect. Lymphatics: No cervical or clavicular LAD.   LAB  RESULTS:  No visits with results within 3 Day(s) from this visit. Latest known visit with results is:  Appointment on 10/23/2015  Component Date Value Ref Range Status  . WBC 10/23/2015 6.0  3.6 - 11.0 K/uL Final  . RBC 10/23/2015 4.13  3.80 - 5.20 MIL/uL Final  . Hemoglobin 10/23/2015 11.9* 12.0 - 16.0 g/dL Final  . HCT 10/23/2015 36.0  35.0 - 47.0 % Final  . MCV 10/23/2015 87.1  80.0 - 100.0 fL Final  . MCH 10/23/2015 28.7  26.0 - 34.0 pg Final  . MCHC 10/23/2015 33.0  32.0 - 36.0 g/dL Final  . RDW 10/23/2015 13.6  11.5 - 14.5 % Final  . Platelets 10/23/2015 241  150 - 440 K/uL Final  . Neutrophils Relative % 10/23/2015 78   Final  . Neutro Abs 10/23/2015 4.7  1.4 - 6.5 K/uL Final  . Lymphocytes Relative 10/23/2015 13   Final  . Lymphs Abs 10/23/2015 0.8* 1.0 - 3.6 K/uL Final  . Monocytes Relative 10/23/2015 8   Final  . Monocytes Absolute 10/23/2015 0.5  0.2 - 0.9 K/uL Final  . Eosinophils Relative 10/23/2015 1   Final  . Eosinophils Absolute 10/23/2015 0.0  0 - 0.7 K/uL Final  . Basophils Relative 10/23/2015 0   Final  .  Basophils Absolute 10/23/2015 0.0  0 - 0.1 K/uL Final  . Sodium 10/23/2015 136  135 - 145 mmol/L Final  . Potassium 10/23/2015 3.4* 3.5 - 5.1 mmol/L Final  . Chloride 10/23/2015 105  101 - 111 mmol/L Final  . CO2 10/23/2015 23  22 - 32 mmol/L Final  . Glucose, Bld 10/23/2015 93  65 - 99 mg/dL Final  . BUN 10/23/2015 26* 6 - 20 mg/dL Final  . Creatinine, Ser 10/23/2015 0.78  0.44 - 1.00 mg/dL Final  . Calcium 10/23/2015 8.8* 8.9 - 10.3 mg/dL Final  . Total Protein 10/23/2015 7.1  6.5 - 8.1 g/dL Final  . Albumin 10/23/2015 4.0  3.5 - 5.0 g/dL Final  . AST 10/23/2015 22  15 - 41 U/L Final  . ALT 10/23/2015 17  14 - 54 U/L Final  . Alkaline Phosphatase 10/23/2015 35* 38 - 126 U/L Final  . Total Bilirubin 10/23/2015 0.6  0.3 - 1.2 mg/dL Final  . GFR calc non Af Amer 10/23/2015 >60  >60 mL/min Final  . GFR calc Af Amer 10/23/2015 >60  >60 mL/min Final    Comment: (NOTE) The eGFR has been calculated using the CKD EPI equation. This calculation has not been validated in all clinical situations. eGFR's persistently <60 mL/min signify possible Chronic Kidney Disease.   . Anion gap 10/23/2015 8  5 - 15 Final    STUDIES: No results found.  ASSESSMENT:  DCIS.  PLAN:  1. Carcinoma of right breast, ductal carcinoma in situ type. Patient is status post lumpectomy as well as radiation therapy. BRCA mutation studies were reported as negative as well as my risk.   2.  As pains and shortness of breath patient is in tamoxifen she has been taken off tamoxifen and will proceed with CT scan with PE protocol We will be Off tamoxifen.3.  Because of some abnormal vaginal spotting and bleeding I suggested patient is to see her GYN physician as soon as possible to be sure we are not dealing with any abnormality  3.  Patient has a mole on the left breast and was advised to get dermatologist relocated however does not have any cataract or off melanoma at present time   Patient's chest pain has completely resolved.  But considering that patient was on tamoxifen will get up CT scan with PE protocol as soon as possible And they will call us with the result. Patient expressed understanding and was in agreement with this plan. She also understands that She can call clinic at any time with any questions, concerns, or complaints.     Carcinoma in situ of breast   Staging form: Breast, AJCC 7th Edition     Clinical: Stage 0 (Tis (DCIS), N0, M0) - Unsigned   Forest Gleason, MD   12/12/2015 8:58 AM

## 2015-12-12 NOTE — Addendum Note (Signed)
Addended by: Luella Cook on: 12/12/2015 09:35 AM   Modules accepted: Orders

## 2015-12-12 NOTE — Progress Notes (Signed)
Patient has had some SOB and chest pain while on Tamoxifen.  She further states she has had some vaginal bleeding off and on and has been spotting since Sunday. She feels sluggish and tired a lot. Patient stopped Tamoxifen last Thursday.   Has an OB appointment coming up soon.  Also states it is time for mammogram and would like that scheduled through [facility].  Patient states she also has a mole under her left breast that the PA told her to have checked.

## 2016-01-06 ENCOUNTER — Ambulatory Visit (INDEPENDENT_AMBULATORY_CARE_PROVIDER_SITE_OTHER): Payer: BLUE CROSS/BLUE SHIELD | Admitting: Family Medicine

## 2016-01-06 VITALS — BP 100/60 | HR 74 | Temp 98.2°F | Resp 16 | Wt 126.0 lb

## 2016-01-06 DIAGNOSIS — F329 Major depressive disorder, single episode, unspecified: Secondary | ICD-10-CM | POA: Diagnosis not present

## 2016-01-06 DIAGNOSIS — F32A Depression, unspecified: Secondary | ICD-10-CM

## 2016-01-06 NOTE — Progress Notes (Signed)
Patient ID: Kaylee Benson, female   DOB: 1971/02/23, 44 y.o.   MRN: [id number]   CHARMON SIMONIAN  MRN: [id number] DOB: Apr 05, 1971  Subjective:  HPI   1. Depression The patient is a 45 year old female who presents today for follow up of her depression. She is currently not on any medication and states she feels she is doing well. Patient relates that now that the holidays are over and her mother is recovering from her major surgery she is doing much better.   The patient has recently had side effects to the Tomoxefin and has had this discontinued.   She experienced chest pain and abnormal bleeding.  She was seen by oncology and they have made adjustments with the medication and oirdered CT scan that was negative.  She was seen by Gyn and had Pap smear done and is scheduled for D&C and Mammogram.  Her LMP began yesterday.     Patient Active Problem List   Diagnosis Date Noted  . Abnormal finding on mammography 09/04/2015  . Personal history of urinary calculi 09/04/2015  . Depression 09/04/2015  . 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 Allergic rhinitis 02/16/2008    Past Medical History  Diagnosis Date  . Kidney stones   . PONV (postoperative nausea and vomiting)     nausea  . Allergy     seasonal  . Kidney stone   . Breast cancer ([place])   . Carcinoma in situ of breast 04/15/2015    Social History   Social History  . Marital Status: Married    Spouse Name: N/A  . Number of Children: N/A  . Years of Education: N/A   Occupational History  . Not on file.   Social History Main Topics  . Smoking status: Never Smoker   . Smokeless tobacco: Not on file  . Alcohol Use: Yes     Comment: Drinks glass of wine 1-2 a month  . Drug Use: No  . Sexual Activity: Yes    Birth Control/ Protection: Other-see comments     Comment: Essure procedure done 1 year ago, menarche age 23, G57, P 100, maternal age 43-25   Other Topics Concern  . Not on file   Social History Narrative     Outpatient Prescriptions Prior to Visit  Medication Sig Dispense Refill  . BIOTIN PO Take 1 tablet by mouth.    . Calcium Carbonate-Vitamin D (CALCIUM + D PO) Take by mouth once.    . Multiple Vitamin (MULTIVITAMIN) tablet Take 1 tablet by mouth daily.    . naproxen sodium (ANAPROX) 220 MG tablet Take 220 mg by mouth as needed.     No facility-administered medications prior to visit.    No Known Allergies  Review of Systems  Constitutional: Negative for fever, chills and malaise/fatigue.  HENT: Positive for congestion.   Respiratory: Negative for cough, hemoptysis, sputum production, shortness of breath and wheezing.   Cardiovascular: Negative for chest pain, palpitations, orthopnea and leg swelling.  Neurological: Negative for dizziness, weakness and headaches.  Psychiatric/Behavioral: Negative.    Objective:  BP 100/60 mmHg  Pulse 74  Temp(Src) 98.2 F (36.8 C) (Oral)  Resp 16  Wt 126 lb (57.153 kg)  LMP 11/19/2015  Physical Exam  Constitutional: She is oriented to person, place, and time and well-developed, well-nourished, and in no distress.  HENT:  Head: Normocephalic and atraumatic.  Right Ear: External ear normal.  Left Ear: External ear normal.  Nose: Nose normal.  Eyes: Conjunctivae are normal. Pupils are equal, round, and reactive to light.  Neck: Normal range of motion. Neck supple.  Cardiovascular: Normal rate and regular rhythm.   Pulmonary/Chest: Effort normal and breath sounds normal.  Neurological: She is alert and oriented to person, place, and time.  Skin: Skin is warm and dry.  Psychiatric: Mood, memory, affect and judgment normal.    Assessment and Plan :   1. Depression Situational--resolving. Follow up in October for CPE. I have done the exam and reviewed the above chart and it is accurate to the best of my knowledge.'  Miguel Aschoff MD [facility] 01/06/2016 10:05 AM

## 2016-01-07 ENCOUNTER — Ambulatory Visit
Admission: RE | Admit: 2016-01-07 | Discharge: 2016-01-07 | Disposition: A | Discharge status: Home / Self Care | Payer: BLUE CROSS/BLUE SHIELD | Source: Ambulatory Visit | Attending: Oncology | Admitting: Oncology

## 2016-01-07 ENCOUNTER — Other Ambulatory Visit: Payer: Self-pay | Admitting: Oncology

## 2016-01-07 DIAGNOSIS — D0591 Unspecified type of carcinoma in situ of right breast: Secondary | ICD-10-CM

## 2016-01-07 DIAGNOSIS — Z853 Personal history of malignant neoplasm of breast: Secondary | ICD-10-CM | POA: Diagnosis not present

## 2016-01-07 DIAGNOSIS — Z9889 Other specified postprocedural states: Secondary | ICD-10-CM | POA: Diagnosis not present

## 2016-01-09 ENCOUNTER — Inpatient Hospital Stay: Payer: BLUE CROSS/BLUE SHIELD | Attending: Oncology | Admitting: Oncology

## 2016-04-22 ENCOUNTER — Ambulatory Visit: Payer: Self-pay | Admitting: Family Medicine

## 2016-04-22 ENCOUNTER — Inpatient Hospital Stay: Payer: BLUE CROSS/BLUE SHIELD

## 2016-04-22 ENCOUNTER — Inpatient Hospital Stay: Payer: BLUE CROSS/BLUE SHIELD | Attending: Oncology | Admitting: Family Medicine

## 2016-04-22 ENCOUNTER — Other Ambulatory Visit: Payer: BLUE CROSS/BLUE SHIELD

## 2016-04-22 ENCOUNTER — Ambulatory Visit: Payer: 59 | Admitting: Oncology

## 2016-04-22 ENCOUNTER — Other Ambulatory Visit: Payer: 59

## 2016-04-22 VITALS — BP 100/71 | HR 91 | Temp 97.5°F | Resp 18 | Ht 60.5 in | Wt 125.7 lb

## 2016-04-22 DIAGNOSIS — Z853 Personal history of malignant neoplasm of breast: Secondary | ICD-10-CM | POA: Diagnosis not present

## 2016-04-22 DIAGNOSIS — Z87442 Personal history of urinary calculi: Secondary | ICD-10-CM | POA: Diagnosis not present

## 2016-04-22 DIAGNOSIS — Z803 Family history of malignant neoplasm of breast: Secondary | ICD-10-CM | POA: Insufficient documentation

## 2016-04-22 DIAGNOSIS — Z923 Personal history of irradiation: Secondary | ICD-10-CM | POA: Insufficient documentation

## 2016-04-22 DIAGNOSIS — D0591 Unspecified type of carcinoma in situ of right breast: Secondary | ICD-10-CM

## 2016-04-22 LAB — COMPREHENSIVE METABOLIC PANEL
ALBUMIN: 4.5 g/dL (ref 3.5–5.0)
ALK PHOS: 51 U/L (ref 38–126)
ALT: 18 U/L (ref 14–54)
ANION GAP: 9 (ref 5–15)
AST: 23 U/L (ref 15–41)
BUN: 23 mg/dL — ABNORMAL HIGH (ref 6–20)
CALCIUM: 9.3 mg/dL (ref 8.9–10.3)
CO2: 24 mmol/L (ref 22–32)
Chloride: 105 mmol/L (ref 101–111)
Creatinine, Ser: 0.89 mg/dL (ref 0.44–1.00)
GFR calc non Af Amer: >60 mL/min (ref >60)
Glucose, Bld: 88 mg/dL (ref 65–99)
POTASSIUM: 3.9 mmol/L (ref 3.5–5.1)
SODIUM: 138 mmol/L (ref 135–145)
Total Bilirubin: 0.7 mg/dL (ref 0.3–1.2)
Total Protein: 7.8 g/dL (ref 6.5–8.1)

## 2016-04-22 LAB — CBC WITH DIFFERENTIAL/PLATELET
BASOS PCT: 0 %
Basophils Absolute: 0 10*3/uL (ref 0–0.1)
EOS ABS: 0.1 10*3/uL (ref 0–0.7)
EOS PCT: 2 %
HCT: 37.6 % (ref 35.0–47.0)
Hemoglobin: 12.5 g/dL (ref 12.0–16.0)
LYMPHS ABS: 0.7 10*3/uL — AB (ref 1.0–3.6)
Lymphocytes Relative: 11 %
MCH: 27.7 pg (ref 26.0–34.0)
MCHC: 33.2 g/dL (ref 32.0–36.0)
MCV: 83.3 fL (ref 80.0–100.0)
MONO ABS: 0.4 10*3/uL (ref 0.2–0.9)
MONOS PCT: 6 %
Neutro Abs: 4.8 10*3/uL (ref 1.4–6.5)
Neutrophils Relative %: 81 %
PLATELETS: 273 10*3/uL (ref 150–440)
RBC: 4.51 MIL/uL (ref 3.80–5.20)
RDW: 13.8 % (ref 11.5–14.5)
WBC: 5.9 10*3/uL (ref 3.6–11.0)

## 2016-04-22 NOTE — Progress Notes (Signed)
[place]  [phone]  [phone]     Kaylee Benson DOB: 20-Aug-1971  MR#: [id number]  TSV#:[id number]  Patient Care Team: Jerrol Banana., MD as PCP - General (Family Medicine)  CHIEF COMPLAINT:  Chief Complaint  Patient presents with  . Follow-up    Breast Cancer   DCIS.  INTERVAL HISTORY:  Patient is here for continued follow-up and treatment consideration regarding DCIS of the right breast. Patient was originally diagnosed in early 2016 following an abnormal mammogram, with subsequent biopsy and lumpectomy, and radiation therapy. Of note patient also with strong family history of breast cancer as well as bilateral breast implants. She was previously on tamoxifen but in January 2017 developed shortness of breath, chest pain, and vaginal bleeding. She was evaluated by gynecologist and D&C performed in March 2017, no malignancy or hyperplasia was present. CT of chest at that time was also negative for PE or any other abnormality. She has since been taken off of tamoxifen by Dr. Oliva Bustard. She overall reports feeling very well since tamoxifen was discontinued. She also reports performing routine self breast exams. Her most recent mammogram was in February 2017 and reported as BI-RADS 2, benign.  REVIEW OF SYSTEMS:   Review of Systems  Constitutional: Negative for fever, chills, weight loss, malaise/fatigue and diaphoresis.  HENT: Negative for congestion, ear discharge, ear pain, hearing loss, nosebleeds, sore throat and tinnitus.   Eyes: Negative for blurred vision, double vision, photophobia, pain, discharge and redness.  Respiratory: Negative for cough, hemoptysis, sputum production, shortness of breath, wheezing and stridor.   Cardiovascular: Negative for chest pain, palpitations, orthopnea, claudication, leg swelling and PND.  Gastrointestinal: Negative for heartburn, nausea, vomiting, abdominal pain, diarrhea, constipation, blood in stool and  melena.  Genitourinary: Negative.   Musculoskeletal: Negative.   Skin: Negative.   Neurological: Negative for dizziness, tingling, focal weakness, seizures, weakness and headaches.  Endo/Heme/Allergies: Does not bruise/bleed easily.  Psychiatric/Behavioral: Negative for depression. The patient is not nervous/anxious and does not have insomnia.     As per HPI. Otherwise, a complete review of systems is negatve.   PAST MEDICAL HISTORY: Past Medical History  Diagnosis Date  . Kidney stones   . PONV (postoperative nausea and vomiting)     nausea  . Allergy     seasonal  . Kidney stone   . 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 Breast cancer ([place]) right    radiation    PAST SURGICAL HISTORY: Past Surgical History  Procedure Laterality Date  . Breast surgery      bilateral breast implants 2001  . Surgery on left lower leg      2007  . Breast lumpectomy with needle localization Right 01/30/2015    Procedure: BREAST LUMPECTOMY WITH NEEDLE LOCALIZATION;  Surgeon: Erroll Luna, MD;  Location: [facility];  Service: General;  Laterality: Right;  . Breast capsulectomy with implant exchange Right 01/30/2015    Procedure: BREAST CAPSULECTOMY WITH IMPLANT EXCHANGE;  Surgeon: Bea Graff, MD;  Location: [place] [facility];  Service: Plastics;  Laterality: Right;  . Breast enhancement surgery Bilateral   . Foot surgery Left   . Mouth surgery    . Orif ankle fracture      left  . Breast biopsy Right 2016    +  . Augmentation mammaplasty      FAMILY HISTORY Family History  Problem Relation Age of Onset  . Breast cancer Mother 60  . Heart disease  Mother   . Hypertension Mother     GYNECOLOGIC HISTORY:  No LMP recorded.     ADVANCED DIRECTIVES:    HEALTH MAINTENANCE: Social History  Substance Use Topics  . Smoking status: Never Smoker   . Smokeless tobacco: Not on file  . Alcohol Use: Yes     Comment: Drinks glass of wine 1-2 a month      Colonoscopy:  PAP:February 2017  Bone density:  Mammogram: February 2017  No Known Allergies  Current Outpatient Prescriptions  Medication Sig Dispense Refill  . Calcium Carbonate-Vitamin D (CALCIUM + D PO) Take by mouth once.    . Multiple Vitamin (MULTIVITAMIN) tablet Take 1 tablet by mouth daily.    . naproxen sodium (ANAPROX) 220 MG tablet Take 220 mg by mouth as needed.    . BIOTIN PO Take 1 tablet by mouth. Reported on 04/22/2016     No current facility-administered medications for this visit.    OBJECTIVE: BP 100/71 mmHg  Pulse 91  Temp(Src) 97.5 F (36.4 C) (Tympanic)  Resp 18  Ht 5' 0.5" (1.537 m)  Wt 125 lb 10.6 oz (57 kg)  BMI 24.13 kg/m2   Body mass index is 24.13 kg/(m^2).    ECOG FS:0 - Asymptomatic  General: Well-developed, well-nourished, no acute distress. Eyes: Pink conjunctiva, anicteric sclera. HEENT: Normocephalic, moist mucous membranes, clear oropharnyx. Lungs: Clear to auscultation bilaterally. Heart: Regular rate and rhythm. No rubs, murmurs, or gallops. Abdomen: Soft, nontender, nondistended. No organomegaly noted, normoactive bowel sounds. Breast: Bilateral breast implants noted. Right lumpectomy site well-healed, scarring present. Breast palpated in a circular manner in the sitting and supine positions.  No masses or fullness palpated.  Axilla palpated in both positions with no masses or fullness palpated.  Musculoskeletal: No edema, cyanosis, or clubbing. Neuro: Alert, answering all questions appropriately. Cranial nerves grossly intact. Skin: No rashes or petechiae noted. Psych: Normal affect. Lymphatics: No cervical, clavicular, or axillary LAD.   LAB RESULTS:  Appointment on 04/22/2016  Component Date Value Ref Range Status  . WBC 04/22/2016 5.9  3.6 - 11.0 K/uL Final  . RBC 04/22/2016 4.51  3.80 - 5.20 MIL/uL Final  . Hemoglobin 04/22/2016 12.5  12.0 - 16.0 g/dL Final  . HCT 04/22/2016 37.6  35.0 - 47.0 % Final  . MCV 04/22/2016  83.3  80.0 - 100.0 fL Final  . MCH 04/22/2016 27.7  26.0 - 34.0 pg Final  . MCHC 04/22/2016 33.2  32.0 - 36.0 g/dL Final  . RDW 04/22/2016 13.8  11.5 - 14.5 % Final  . Platelets 04/22/2016 273  150 - 440 K/uL Final  . Neutrophils Relative % 04/22/2016 81   Final  . Neutro Abs 04/22/2016 4.8  1.4 - 6.5 K/uL Final  . Lymphocytes Relative 04/22/2016 11   Final  . Lymphs Abs 04/22/2016 0.7* 1.0 - 3.6 K/uL Final  . Monocytes Relative 04/22/2016 6   Final  . Monocytes Absolute 04/22/2016 0.4  0.2 - 0.9 K/uL Final  . Eosinophils Relative 04/22/2016 2   Final  . Eosinophils Absolute 04/22/2016 0.1  0 - 0.7 K/uL Final  . Basophils Relative 04/22/2016 0   Final  . Basophils Absolute 04/22/2016 0.0  0 - 0.1 K/uL Final  . Sodium 04/22/2016 138  135 - 145 mmol/L Final  . Potassium 04/22/2016 3.9  3.5 - 5.1 mmol/L Final  . Chloride 04/22/2016 105  101 - 111 mmol/L Final  . CO2 04/22/2016 24  22 - 32 mmol/L Final  . Glucose,   Bld 04/22/2016 88  65 - 99 mg/dL Final  . BUN 04/22/2016 23* 6 - 20 mg/dL Final  . Creatinine, Ser 04/22/2016 0.89  0.44 - 1.00 mg/dL Final  . Calcium 04/22/2016 9.3  8.9 - 10.3 mg/dL Final  . Total Protein 04/22/2016 7.8  6.5 - 8.1 g/dL Final  . Albumin 04/22/2016 4.5  3.5 - 5.0 g/dL Final  . AST 04/22/2016 23  15 - 41 U/L Final  . ALT 04/22/2016 18  14 - 54 U/L Final  . Alkaline Phosphatase 04/22/2016 51  38 - 126 U/L Final  . Total Bilirubin 04/22/2016 0.7  0.3 - 1.2 mg/dL Final  . GFR calc non Af Amer 04/22/2016 >60  >60 mL/min Final  . GFR calc Af Amer 04/22/2016 >60  >60 mL/min Final   Comment: (NOTE) The eGFR has been calculated using the CKD EPI equation. This calculation has not been validated in all clinical situations. eGFR's persistently <60 mL/min signify possible Chronic Kidney Disease.   . Anion gap 04/22/2016 9  5 - 15 Final    STUDIES: No results found.  ASSESSMENT:  DCIS, Stage 0, Tis N0 M0.  PLAN:  1. Carcinoma of right breast, ductal carcinoma  in situ type. Patient is status post lumpectomy as well as radiation therapy. BRCA mutation studies were reported as negative as well as My Risk. Tamoxifen has been discontinued due to side effect of chest pain, shortness of breath, as well as endometrial bleeding. She continues with routine GYN evaluation, following endometrial bleeding patient had a D&C which was negative for hyperplasia or malignancy. Her most recent mammogram was performed in February 2017 and reported as BI-RADS 2, negative. As patient experienced significant side effect she will not be restarted on tamoxifen at this time. This was discussed with Dr. Choksi as well as patient and both are in agreement. We will continue with routine follow-up in 12 months. We'll proceed with scheduling next diagnostic mammogram,.  Patient expressed understanding and was in agreement with this plan. She also understands that She can call clinic at any time with any questions, concerns, or complaints.   Dr. Choksi was available for consultation and review of plan of care for this patient.  Carcinoma in situ of breast   Staging form: Breast, AJCC 7th Edition     Clinical: Stage 0 (Tis (DCIS), N0, M0) - Unsigned   Leslie F Herring, NP   04/22/2016 10:04 AM     

## 2016-04-22 NOTE — Progress Notes (Signed)
Pt reports being off her tamoxifin now for some time.  Stopped taking around last visit.  Stopped medication because of side effect SOB, also reports she had thickening of lining of uterus had procedure D & C pt reports in or around March also thought as side effect of medication.  Pt reports having lower back pain that has increased.  Pt reports having last mammogram 01/07/2016

## 2016-08-13 DIAGNOSIS — B9689 Other specified bacterial agents as the cause of diseases classified elsewhere: Secondary | ICD-10-CM | POA: Diagnosis not present

## 2016-08-13 DIAGNOSIS — J019 Acute sinusitis, unspecified: Secondary | ICD-10-CM | POA: Diagnosis not present

## 2016-10-06 ENCOUNTER — Encounter: Payer: BLUE CROSS/BLUE SHIELD | Admitting: Family Medicine

## 2016-10-08 ENCOUNTER — Encounter: Payer: Self-pay | Admitting: Family Medicine

## 2016-10-08 ENCOUNTER — Ambulatory Visit (INDEPENDENT_AMBULATORY_CARE_PROVIDER_SITE_OTHER): Payer: BLUE CROSS/BLUE SHIELD | Admitting: Family Medicine

## 2016-10-08 VITALS — BP 110/62 | HR 84 | Temp 98.1°F | Resp 16 | Ht 61.0 in | Wt 129.0 lb

## 2016-10-08 DIAGNOSIS — Z0000 Encounter for general adult medical examination without abnormal findings: Secondary | ICD-10-CM | POA: Diagnosis not present

## 2016-10-08 DIAGNOSIS — J3089 Other allergic rhinitis: Secondary | ICD-10-CM | POA: Diagnosis not present

## 2016-10-08 DIAGNOSIS — J040 Acute laryngitis: Secondary | ICD-10-CM

## 2016-10-08 LAB — POCT URINALYSIS DIPSTICK
BILIRUBIN UA: NEGATIVE
Glucose, UA: NEGATIVE
KETONES UA: NEGATIVE
LEUKOCYTES UA: NEGATIVE
Nitrite, UA: NEGATIVE
PH UA: 6
PROTEIN UA: NEGATIVE
RBC UA: NEGATIVE
SPEC GRAV UA: 1.01
Urobilinogen, UA: 0.2

## 2016-10-08 MED ORDER — LORATADINE 10 MG PO TABS: 10.0000 mg | ORAL_TABLET | Freq: Every day | ORAL | 12 refills | Status: DC

## 2016-10-08 MED ORDER — FLUTICASONE PROPIONATE 50 MCG/ACT NA SUSP: 2.0000 | Freq: Every day | NASAL | 12 refills | Status: AC

## 2016-10-08 NOTE — Progress Notes (Signed)
Patient: Kaylee Benson, Female    DOB: March 05, 1971, 45 y.o.   MRN: [id number] Visit Date: 10/08/2016  Today's Provider: Wilhemena Durie, MD   Chief Complaint  Patient presents with  . Annual Exam  . Hoarse   Subjective:    Annual physical exam Kaylee Benson is a 45 y.o. female who presents today for health maintenance and complete physical. She feels fairly well. She reports she is not exercising regularly. She reports she is sleeping poorly, due to having congestion and hoarseness. Pt think it could be allergy related. She has watery eyes on and off, congestion and cough. She lost her voice yesterday and talks on the phone a lot at work. She reports that she does not feel bad, but this is keeping her up at night.  ----------------------------------------------------------------- Pt has gyn that does her pap smear and mammogram. She thinks that she had Tdap when she ws pregnant with her son who is 5. Pt would like to see about getting tested for menopause. She has been having hot flashes and irregular periods.   Review of Systems  Constitutional: Negative.   HENT: Positive for congestion, sinus pressure, sneezing and voice change.   Eyes: Negative.   Respiratory: Negative.   Cardiovascular: Negative.   Gastrointestinal: Negative.   Endocrine: Negative.   Genitourinary: Negative.   Musculoskeletal: Positive for back pain and neck stiffness.  Skin: Negative.   Allergic/Immunologic: Positive for environmental allergies.  Neurological: Negative.   Hematological: Negative.   Psychiatric/Behavioral: Negative.     Social History      She  reports that she has never smoked. She has never used smokeless tobacco. She reports that she drinks alcohol. She reports that she does not use drugs.       Social History   Social History  . Marital status: Married    Spouse name: N/A  . Number of children: N/A  . Years of education: N/A   Social History Main Topics  . Smoking  status: Never Smoker  . Smokeless tobacco: Never Used  . Alcohol use Yes     Comment: Drinks glass of wine 1-2 a month  . Drug use: No  . Sexual activity: Yes    Birth control/ protection: Other-see comments     Comment: Essure procedure done 1 year ago, menarche age 12, G56, P 30, maternal age 45-25   Other Topics Concern  . None   Social History Narrative  . None    Past Medical History:  Diagnosis Date  . Allergy    seasonal  . Breast cancer ([place]) right   radiation  . 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 Kidney stone   . Kidney stones   . PONV (postoperative nausea and vomiting)    nausea     Patient Active Problem List   Diagnosis Date Noted  . Abnormal finding on mammography 09/04/2015  . Personal history of urinary calculi 09/04/2015  . Depression 09/04/2015  . 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 Allergic rhinitis 02/16/2008    Past Surgical History:  Procedure Laterality Date  . AUGMENTATION MAMMAPLASTY    . BREAST BIOPSY Right 2016   +  . BREAST CAPSULECTOMY WITH IMPLANT EXCHANGE Right 01/30/2015   Procedure: BREAST CAPSULECTOMY WITH IMPLANT EXCHANGE;  Surgeon: Bea Graff, MD;  Location: [facility];  Service: Plastics;  Laterality: Right;  . BREAST ENHANCEMENT SURGERY Bilateral   . BREAST LUMPECTOMY WITH NEEDLE LOCALIZATION  Right 01/30/2015   Procedure: BREAST LUMPECTOMY WITH NEEDLE LOCALIZATION;  Surgeon: Erroll Luna, MD;  Location: [place];  Service: General;  Laterality: Right;  . BREAST SURGERY     bilateral breast implants 2001  . FOOT SURGERY Left   . MOUTH SURGERY    . ORIF ANKLE FRACTURE     left  . Surgery on left lower leg     2007    Family History        Family Status  Relation Status  . Mother         Her family history includes Breast cancer (age of onset: 74) in her mother; Heart disease in her mother; Hypertension in her mother.     No Known Allergies   Current Outpatient  Prescriptions:  .  BIOTIN PO, Take 1 tablet by mouth. Reported on 04/22/2016, Disp: , Rfl:  .  Calcium Carbonate-Vitamin D (CALCIUM + D PO), Take by mouth once., Disp: , Rfl:  .  Multiple Vitamin (MULTIVITAMIN) tablet, Take 1 tablet by mouth daily., Disp: , Rfl:  .  naproxen sodium (ANAPROX) 220 MG tablet, Take 220 mg by mouth as needed., Disp: , Rfl:    Patient Care Team: Jerrol Banana., MD as PCP - General (Family Medicine)      Objective:   Vitals: BP 110/62 (BP Location: Left Arm, Patient Position: Sitting, Cuff Size: Normal)   Pulse 84   Temp 98.1 F (36.7 C) (Oral)   Resp 16   Ht 5\' 1"  (1.549 m)   Wt 129 lb (58.5 kg)   BMI 24.37 kg/m    Physical Exam  Constitutional: She is oriented to person, place, and time. She appears well-developed and well-nourished.  HENT:  Head: Normocephalic and atraumatic.  Right Ear: External ear normal.  Left Ear: External ear normal.  Nose: Nose normal.  Mouth/Throat: Oropharynx is clear and moist.  Eyes: Conjunctivae and EOM are normal. Pupils are equal, round, and reactive to light.  Neck: Normal range of motion. Neck supple.  Cardiovascular: Normal rate, regular rhythm, normal heart sounds and intact distal pulses.   Pulmonary/Chest: Effort normal and breath sounds normal.  Abdominal: Soft. Bowel sounds are normal.  Musculoskeletal: Normal range of motion.  Neurological: She is alert and oriented to person, place, and time. She has normal reflexes.  Skin: Skin is warm and dry.  Psychiatric: She has a normal mood and affect. Her behavior is normal. Judgment and thought content normal.     Depression Screen PHQ 2/9 Scores 10/08/2016 11/27/2015 09/04/2015 05/23/2015  PHQ - 2 Score 0 0 0 0      Assessment & Plan:     Routine Health Maintenance and Physical Exam  Exercise Activities and Dietary recommendations Goals    None       There is no immunization history on file for this patient.  Health Maintenance  Topic  Date Due  . HIV Screening  10/07/1986  . TETANUS/TDAP  10/07/1990  . PAP SMEAR  10/07/1992  . INFLUENZA VACCINE  10/08/2017 (Originally 06/23/2016)    Breast/pelvic/DRE per Gyn. Discussed health benefits of physical activity, and encouraged her to engage in regular exercise appropriate for her age and condition.   Laryngitis Refer to ENT if this persists. AR- Try Loratadine and Flonase NS Right Breast Cancer-DCIS-2016 H/o kidney stones H/o Breast Implants  I have done the exam and reviewed the chart and it is accurate to the best of my knowledge. Development worker, community has  been used and  any errors in dictation or transcription are unintentional. Miguel Aschoff M.D. [facility]    --------------------------------------------------------------------    Wilhemena Durie, MD  [facility]

## 2016-10-14 DIAGNOSIS — Z0000 Encounter for general adult medical examination without abnormal findings: Secondary | ICD-10-CM | POA: Diagnosis not present

## 2016-10-15 LAB — CBC WITH DIFFERENTIAL/PLATELET
BASOS: 0 %
Basophils Absolute: 0 10*3/uL (ref 0.0–0.2)
EOS (ABSOLUTE): 0.1 10*3/uL (ref 0.0–0.4)
Eos: 3 %
HEMATOCRIT: 37.5 % (ref 34.0–46.6)
HEMOGLOBIN: 12.1 g/dL (ref 11.1–15.9)
IMMATURE GRANS (ABS): 0 10*3/uL (ref 0.0–0.1)
Immature Granulocytes: 0 %
LYMPHS ABS: 0.9 10*3/uL (ref 0.7–3.1)
LYMPHS: 20 %
MCH: 26.9 pg (ref 26.6–33.0)
MCHC: 32.3 g/dL (ref 31.5–35.7)
MCV: 84 fL (ref 79–97)
MONOCYTES: 10 %
Monocytes Absolute: 0.5 10*3/uL (ref 0.1–0.9)
NEUTROS ABS: 3.1 10*3/uL (ref 1.4–7.0)
Neutrophils: 67 %
Platelets: 323 10*3/uL (ref 150–379)
RBC: 4.49 x10E6/uL (ref 3.77–5.28)
RDW: 16.7 % — ABNORMAL HIGH (ref 12.3–15.4)
WBC: 4.7 10*3/uL (ref 3.4–10.8)

## 2016-10-15 LAB — LIPID PANEL WITH LDL/HDL RATIO
Cholesterol, Total: 216 mg/dL — ABNORMAL HIGH (ref 100–199)
HDL: 70 mg/dL (ref >39)
LDL CALC: 124 mg/dL — AB (ref 0–99)
LDL/HDL RATIO: 1.8 ratio (ref 0.0–3.2)
Triglycerides: 109 mg/dL (ref 0–149)
VLDL CHOLESTEROL CAL: 22 mg/dL (ref 5–40)

## 2016-10-15 LAB — COMPREHENSIVE METABOLIC PANEL
ALBUMIN: 4.4 g/dL (ref 3.5–5.5)
ALK PHOS: 54 IU/L (ref 39–117)
ALT: 19 IU/L (ref 0–32)
AST: 21 IU/L (ref 0–40)
Albumin/Globulin Ratio: 1.8 (ref 1.2–2.2)
BILIRUBIN TOTAL: 0.2 mg/dL (ref 0.0–1.2)
BUN / CREAT RATIO: 17 (ref 9–23)
BUN: 15 mg/dL (ref 6–24)
CHLORIDE: 102 mmol/L (ref 96–106)
CO2: 23 mmol/L (ref 18–29)
Calcium: 9.2 mg/dL (ref 8.7–10.2)
Creatinine, Ser: 0.9 mg/dL (ref 0.57–1.00)
GFR calc Af Amer: 89 mL/min/{1.73_m2} (ref >59)
GFR calc non Af Amer: 77 mL/min/{1.73_m2} (ref >59)
GLUCOSE: 82 mg/dL (ref 65–99)
Globulin, Total: 2.5 g/dL (ref 1.5–4.5)
Potassium: 4.8 mmol/L (ref 3.5–5.2)
Sodium: 142 mmol/L (ref 134–144)
Total Protein: 6.9 g/dL (ref 6.0–8.5)

## 2016-10-15 LAB — TSH: TSH: 1.01 u[IU]/mL (ref 0.450–4.500)

## 2016-10-20 ENCOUNTER — Telehealth: Payer: Self-pay

## 2016-10-20 ENCOUNTER — Ambulatory Visit
Admission: RE | Admit: 2016-10-20 | Discharge: 2016-10-20 | Disposition: A | Discharge status: Home / Self Care | Payer: BLUE CROSS/BLUE SHIELD | Source: Ambulatory Visit | Attending: Family Medicine | Admitting: Family Medicine

## 2016-10-20 ENCOUNTER — Ambulatory Visit (INDEPENDENT_AMBULATORY_CARE_PROVIDER_SITE_OTHER): Payer: BLUE CROSS/BLUE SHIELD | Admitting: Family Medicine

## 2016-10-20 ENCOUNTER — Encounter: Payer: Self-pay | Admitting: Family Medicine

## 2016-10-20 VITALS — BP 102/62 | HR 88 | Temp 98.1°F | Resp 16 | Wt 129.8 lb

## 2016-10-20 DIAGNOSIS — M545 Low back pain, unspecified: Secondary | ICD-10-CM

## 2016-10-20 LAB — POCT URINALYSIS DIPSTICK
BILIRUBIN UA: NEGATIVE
GLUCOSE UA: NEGATIVE
Ketones, UA: NEGATIVE
LEUKOCYTES UA: NEGATIVE
NITRITE UA: NEGATIVE
Protein, UA: NEGATIVE
RBC UA: NEGATIVE
Spec Grav, UA: 1.015
UROBILINOGEN UA: 0.2
pH, UA: 6

## 2016-10-20 MED ORDER — CYCLOBENZAPRINE HCL 5 MG PO TABS: 5.0000 mg | ORAL_TABLET | Freq: Three times a day (TID) | ORAL | 1 refills | Status: DC | PRN

## 2016-10-20 NOTE — Progress Notes (Signed)
Patient: Kaylee Benson Female    DOB: 08/04/1971   45 y.o.   MRN: [id number] Visit Date: 10/20/2016  Today's Provider: Vernie Murders, PA   Chief Complaint  Patient presents with  . Back Pain   Subjective:    Back Pain  This is a recurrent problem. The current episode started yesterday. The problem occurs constantly. The problem is unchanged. The quality of the pain is described as aching. Radiates to: buttocks and pelvic area right side. Exacerbated by: movement. She has tried heat and NSAIDs for the symptoms. The treatment provided mild relief.  Patient states she has a PMH of back pain after a fall 1 year ago. She is currently seeing a Restaurant manager, fast food. Patient reports she leaned over the washing machine last night, and her back "locked up" She states the pain is preventing her from standing straight.   Past Medical History:  Diagnosis Date  . Allergy    seasonal  . Breast cancer ([place]) right   radiation  . 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 Kidney stone   . Kidney stones   . PONV (postoperative nausea and vomiting)    nausea   Past Surgical History:  Procedure Laterality Date  . AUGMENTATION MAMMAPLASTY    . BREAST BIOPSY Right 2016   +  . BREAST CAPSULECTOMY WITH IMPLANT EXCHANGE Right 01/30/2015   Procedure: BREAST CAPSULECTOMY WITH IMPLANT EXCHANGE;  Surgeon: Bea Graff, MD;  Location: [place];  Service: Plastics;  Laterality: Right;  . BREAST ENHANCEMENT SURGERY Bilateral   . BREAST LUMPECTOMY WITH NEEDLE LOCALIZATION Right 01/30/2015   Procedure: BREAST LUMPECTOMY WITH NEEDLE LOCALIZATION;  Surgeon: Erroll Luna, MD;  Location: [place];  Service: General;  Laterality: Right;  . BREAST SURGERY     bilateral breast implants 2001  . FOOT SURGERY Left   . MOUTH SURGERY    . ORIF ANKLE FRACTURE     left  . Surgery on left lower leg     2007   Family History  Problem Relation Age of Onset  . Breast cancer Mother 32  . Heart  disease Mother   . Hypertension Mother    No Known Allergies   Previous Medications   BIOTIN PO    Take 1 tablet by mouth. Reported on 04/22/2016   CALCIUM CARBONATE-VITAMIN D (CALCIUM + D PO)    Take by mouth once.   FLUTICASONE (FLONASE) 50 MCG/ACT NASAL SPRAY    Place 2 sprays into both nostrils daily.   LORATADINE (CLARITIN) 10 MG TABLET    Take 1 tablet (10 mg total) by mouth daily.   MULTIPLE VITAMIN (MULTIVITAMIN) TABLET    Take 1 tablet by mouth daily.   NAPROXEN SODIUM (ANAPROX) 220 MG TABLET    Take 220 mg by mouth as needed.    Review of Systems  Constitutional: Negative.   Respiratory: Negative.   Cardiovascular: Negative.   Musculoskeletal: Positive for back pain.    Social History  Substance Use Topics  . Smoking status: Never Smoker  . Smokeless tobacco: Never Used  . Alcohol use Yes     Comment: Drinks glass of wine 1-2 a month   Objective:   BP 102/62 (BP Location: Right Arm, Patient Position: Sitting, Cuff Size: Normal)   Pulse 88   Temp 98.1 F (36.7 C) (Oral)   Resp 16   Wt 129 lb 12.8 oz (58.9 kg)   BMI 24.53 kg/m   Physical Exam  Constitutional: She  is oriented to person, place, and time. She appears well-developed and well-nourished. No distress.  HENT:  Head: Normocephalic and atraumatic.  Right Ear: Hearing normal.  Left Ear: Hearing normal.  Nose: Nose normal.  Eyes: Conjunctivae and lids are normal. Right eye exhibits no discharge. Left eye exhibits no discharge. No scleral icterus.  Neck: Neck supple.  Cardiovascular: Normal rate and regular rhythm.   Pulmonary/Chest: Effort normal and breath sounds normal. No respiratory distress.  Abdominal: Bowel sounds are normal. There is no tenderness.  Musculoskeletal: She exhibits tenderness.  Very tender right lower back with some radiation into pelvis. Increase in sharp pains with trying to stand erect or moving/walking. SLR's 80 degrees with sharp spasms on the right. No muscle weakness.    Neurological: She is alert and oriented to person, place, and time.  Skin: Skin is intact. No lesion and no rash noted.  Psychiatric: She has a normal mood and affect. Her speech is normal and behavior is normal. Thought content normal.      Assessment & Plan:     1. Acute right-sided low back pain without sciatica Onset yesterday. Pain intense and worse with movement or walking. No hematuria or pains similar to past kidney stone 6-7 years ago. Will get urinalysis and x-ray of lower back. Suspect acute strain with spasms. History of a fall at home 1 year ago onto her back and back of her head. Has been going to a chiropractor routinely to relieve intermittent pains since the fall. No new injury this time. 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 Recommend muscle relaxant and Ibuprofen 600-800 mg TID with food. Apply moist heat or ice packs for spasm and given muscle relaxant. Recheck pending reports. - cyclobenzaprine (FLEXERIL) 5 MG tablet; Take 1 tablet (5 mg total) by mouth 3 (three) times daily as needed for muscle spasms.  Dispense: 30 tablet; Refill: 1 - DG Lumbar Spine Complete - POCT urinalysis dipstick

## 2016-10-20 NOTE — Patient Instructions (Signed)

## 2016-10-20 NOTE — Telephone Encounter (Signed)
Called pt as she was walking in for an acute visit with PA. Pt states she preferred to hear the results in person. Advised CMA to inform pt of results. Renaldo Fiddler, CMA

## 2016-10-20 NOTE — Telephone Encounter (Signed)
Advised pt of results. Pt verbally acknowledges understanding. [name], CMA   

## 2016-10-20 NOTE — Telephone Encounter (Signed)
-----   Message from Jerrol Banana., MD sent at 10/20/2016  8:13 AM EST ----- Labs okay. Cholesterol mildly elevated.

## 2016-10-20 NOTE — Telephone Encounter (Signed)
-----   Message from [place], [place] sent at 10/20/2016  1:52 PM EST ----- Normal x-rays without signs of arthritis, disc space anomalies or shift in vertebrae. No signs of kidney stones and urinalysis was negative for blood or infection. Proceed with medication (Ibuprofen 200 mg 4 tablets TID with meals for pain/inflammation and Cyclobenzaprine 5 mg TID for back spasms). Recheck back in 5-7 days if no better.

## 2016-10-21 ENCOUNTER — Ambulatory Visit: Payer: BLUE CROSS/BLUE SHIELD | Admitting: Physician Assistant

## 2016-11-01 DIAGNOSIS — K529 Noninfective gastroenteritis and colitis, unspecified: Secondary | ICD-10-CM | POA: Diagnosis not present

## 2016-11-02 ENCOUNTER — Ambulatory Visit
Admission: RE | Admit: 2016-11-02 | Discharge: 2016-11-02 | Disposition: A | Discharge status: Home / Self Care | Payer: BLUE CROSS/BLUE SHIELD | Source: Ambulatory Visit | Attending: Family Medicine | Admitting: Family Medicine

## 2016-11-02 ENCOUNTER — Observation Stay: Payer: BLUE CROSS/BLUE SHIELD | Admitting: Anesthesiology

## 2016-11-02 ENCOUNTER — Other Ambulatory Visit: Payer: Self-pay | Admitting: Family Medicine

## 2016-11-02 ENCOUNTER — Encounter
Admission: EM | Disposition: A | Discharge status: Home / Self Care | Payer: Self-pay | Source: Home / Self Care | Attending: Emergency Medicine

## 2016-11-02 ENCOUNTER — Other Ambulatory Visit
Admission: RE | Admit: 2016-11-02 | Discharge: 2016-11-02 | Disposition: A | Discharge status: Home / Self Care | Payer: BLUE CROSS/BLUE SHIELD | Source: Ambulatory Visit | Attending: Family Medicine | Admitting: Family Medicine

## 2016-11-02 ENCOUNTER — Encounter: Payer: Self-pay | Admitting: Family Medicine

## 2016-11-02 ENCOUNTER — Observation Stay
Admission: EM | Admit: 2016-11-02 | Discharge: 2016-11-04 | Disposition: A | Discharge status: Home / Self Care | Payer: BLUE CROSS/BLUE SHIELD | Attending: Surgery | Admitting: Surgery

## 2016-11-02 ENCOUNTER — Encounter: Payer: Self-pay | Admitting: Emergency Medicine

## 2016-11-02 ENCOUNTER — Ambulatory Visit (INDEPENDENT_AMBULATORY_CARE_PROVIDER_SITE_OTHER): Payer: BLUE CROSS/BLUE SHIELD | Admitting: Family Medicine

## 2016-11-02 VITALS — BP 88/58 | HR 86 | Temp 99.0°F | Resp 14 | Wt 127.2 lb

## 2016-11-02 DIAGNOSIS — K3580 Unspecified acute appendicitis: Secondary | ICD-10-CM | POA: Diagnosis not present

## 2016-11-02 DIAGNOSIS — Z853 Personal history of malignant neoplasm of breast: Secondary | ICD-10-CM | POA: Insufficient documentation

## 2016-11-02 DIAGNOSIS — K3589 Other acute appendicitis: Secondary | ICD-10-CM | POA: Diagnosis not present

## 2016-11-02 DIAGNOSIS — Z923 Personal history of irradiation: Secondary | ICD-10-CM | POA: Insufficient documentation

## 2016-11-02 DIAGNOSIS — R079 Chest pain, unspecified: Secondary | ICD-10-CM

## 2016-11-02 DIAGNOSIS — R1031 Right lower quadrant pain: Secondary | ICD-10-CM

## 2016-11-02 DIAGNOSIS — D0591 Unspecified type of carcinoma in situ of right breast: Secondary | ICD-10-CM

## 2016-11-02 DIAGNOSIS — R0602 Shortness of breath: Secondary | ICD-10-CM

## 2016-11-02 DIAGNOSIS — R002 Palpitations: Secondary | ICD-10-CM

## 2016-11-02 HISTORY — PX: LAPAROSCOPIC APPENDECTOMY: SHX408

## 2016-11-02 LAB — POCT URINALYSIS DIPSTICK
Bilirubin, UA: NEGATIVE
Blood, UA: NEGATIVE
Glucose, UA: NEGATIVE
Ketones, UA: NEGATIVE
LEUKOCYTES UA: NEGATIVE
NITRITE UA: NEGATIVE
PROTEIN UA: NEGATIVE
Spec Grav, UA: 1.01
UROBILINOGEN UA: 0.2
pH, UA: 6

## 2016-11-02 LAB — COMPREHENSIVE METABOLIC PANEL
ALT: 19 U/L (ref 14–54)
AST: 21 U/L (ref 15–41)
Albumin: 4.4 g/dL (ref 3.5–5.0)
Alkaline Phosphatase: 53 U/L (ref 38–126)
Anion gap: 9 (ref 5–15)
BUN: 22 mg/dL — AB (ref 6–20)
CHLORIDE: 103 mmol/L (ref 101–111)
CO2: 25 mmol/L (ref 22–32)
CREATININE: 0.95 mg/dL (ref 0.44–1.00)
Calcium: 9.2 mg/dL (ref 8.9–10.3)
GFR calc Af Amer: >60 mL/min (ref >60)
Glucose, Bld: 93 mg/dL (ref 65–99)
Potassium: 3.7 mmol/L (ref 3.5–5.1)
Sodium: 137 mmol/L (ref 135–145)
Total Bilirubin: 1 mg/dL (ref 0.3–1.2)
Total Protein: 7.9 g/dL (ref 6.5–8.1)

## 2016-11-02 LAB — CBC WITH DIFFERENTIAL/PLATELET
BASOS ABS: 0 10*3/uL (ref 0–0.1)
Basophils Relative: 0 %
EOS PCT: 0 %
Eosinophils Absolute: 0 10*3/uL (ref 0–0.7)
HEMATOCRIT: 36.5 % (ref 35.0–47.0)
HEMOGLOBIN: 12 g/dL (ref 12.0–16.0)
LYMPHS PCT: 8 %
Lymphs Abs: 1.2 10*3/uL (ref 1.0–3.6)
MCH: 26.1 pg (ref 26.0–34.0)
MCHC: 32.8 g/dL (ref 32.0–36.0)
MCV: 79.7 fL — AB (ref 80.0–100.0)
Monocytes Absolute: 1 10*3/uL — ABNORMAL HIGH (ref 0.2–0.9)
Monocytes Relative: 7 %
NEUTROS ABS: 12.8 10*3/uL — AB (ref 1.4–6.5)
NEUTROS PCT: 85 %
PLATELETS: 279 10*3/uL (ref 150–440)
RBC: 4.58 MIL/uL (ref 3.80–5.20)
RDW: 16 % — ABNORMAL HIGH (ref 11.5–14.5)
WBC: 15 10*3/uL — AB (ref 3.6–11.0)

## 2016-11-02 LAB — POCT PREGNANCY, URINE: Preg Test, Ur: NEGATIVE

## 2016-11-02 SURGERY — APPENDECTOMY, LAPAROSCOPIC
Anesthesia: General | Site: Abdomen | Wound class: Dirty or Infected

## 2016-11-02 MED ORDER — ONDANSETRON HCL 4 MG/2ML IJ SOLN
INTRAMUSCULAR | Status: DC | PRN
  Administered 2016-11-02: 4 mg via INTRAVENOUS

## 2016-11-02 MED ORDER — MIDAZOLAM HCL 2 MG/2ML IJ SOLN
INTRAMUSCULAR | Status: DC | PRN
Start: 2016-11-02 — End: 2016-11-02
  Administered 2016-11-02: 2 mg via INTRAVENOUS

## 2016-11-02 MED ORDER — PROMETHAZINE HCL 25 MG/ML IJ SOLN: 6.2500 mg | INTRAMUSCULAR | Status: DC | PRN

## 2016-11-02 MED ORDER — ONDANSETRON HCL 4 MG/2ML IJ SOLN
4.0000 mg | Freq: Once | INTRAMUSCULAR | Status: AC
  Administered 2016-11-02: 4 mg via INTRAVENOUS

## 2016-11-02 MED ORDER — FENTANYL CITRATE (PF) 100 MCG/2ML IJ SOLN: 25.0000 ug | INTRAMUSCULAR | Status: DC | PRN

## 2016-11-02 MED ORDER — EPHEDRINE SULFATE 50 MG/ML IJ SOLN
INTRAMUSCULAR | Status: DC | PRN
Start: 2016-11-02 — End: 2016-11-02
  Administered 2016-11-02: 10 mg via INTRAVENOUS

## 2016-11-02 MED ORDER — BUPIVACAINE HCL (PF) 0.25 % IJ SOLN
INTRAMUSCULAR | Status: AC
  Filled 2016-11-02: qty 30

## 2016-11-02 MED ORDER — HEPARIN SODIUM (PORCINE) 5000 UNIT/ML IJ SOLN
INTRAMUSCULAR | Status: AC
  Filled 2016-11-02: qty 1

## 2016-11-02 MED ORDER — IOPAMIDOL (ISOVUE-300) INJECTION 61%
75.0000 mL | Freq: Once | INTRAVENOUS | Status: AC | PRN
  Administered 2016-11-02: 75 mL via INTRAVENOUS

## 2016-11-02 MED ORDER — SODIUM CHLORIDE 0.9 % IV SOLN
INTRAVENOUS | Status: DC | PRN
  Administered 2016-11-02: 1000 mL via INTRAMUSCULAR

## 2016-11-02 MED ORDER — LIDOCAINE HCL (CARDIAC) 20 MG/ML IV SOLN
INTRAVENOUS | Status: DC | PRN
  Administered 2016-11-02: 30 mg via INTRAVENOUS

## 2016-11-02 MED ORDER — SUGAMMADEX SODIUM 200 MG/2ML IV SOLN
INTRAVENOUS | Status: DC | PRN
  Administered 2016-11-02: 120 mg via INTRAVENOUS

## 2016-11-02 MED ORDER — ONDANSETRON HCL 4 MG/2ML IJ SOLN
INTRAMUSCULAR | Status: AC
  Administered 2016-11-02: 4 mg via INTRAVENOUS
  Filled 2016-11-02: qty 2

## 2016-11-02 MED ORDER — PIPERACILLIN-TAZOBACTAM 3.375 G IVPB
3.3750 g | Freq: Three times a day (TID) | INTRAVENOUS | Status: DC
  Administered 2016-11-03 – 2016-11-04 (×4): 3.375 g via INTRAVENOUS
  Filled 2016-11-02 (×4): qty 50

## 2016-11-02 MED ORDER — LACTATED RINGERS IV SOLN
INTRAVENOUS | Status: DC | PRN
  Administered 2016-11-02: 23:00:00 via INTRAVENOUS

## 2016-11-02 MED ORDER — MORPHINE SULFATE (PF) 4 MG/ML IV SOLN
4.0000 mg | Freq: Once | INTRAVENOUS | Status: AC
  Administered 2016-11-02: 4 mg via INTRAVENOUS

## 2016-11-02 MED ORDER — BUPIVACAINE HCL (PF) 0.25 % IJ SOLN
INTRAMUSCULAR | Status: DC | PRN
  Administered 2016-11-02: 22 mL

## 2016-11-02 MED ORDER — PIPERACILLIN-TAZOBACTAM 3.375 G IVPB
3.3750 g | Freq: Three times a day (TID) | INTRAVENOUS | Status: DC
  Administered 2016-11-02: 3.375 g via INTRAVENOUS
  Filled 2016-11-02: qty 50

## 2016-11-02 MED ORDER — FENTANYL CITRATE (PF) 100 MCG/2ML IJ SOLN
INTRAMUSCULAR | Status: DC | PRN
  Administered 2016-11-02 (×2): 50 ug via INTRAVENOUS

## 2016-11-02 MED ORDER — PROPOFOL 10 MG/ML IV BOLUS
INTRAVENOUS | Status: DC | PRN
  Administered 2016-11-02: 120 mg via INTRAVENOUS

## 2016-11-02 MED ORDER — ROCURONIUM BROMIDE 100 MG/10ML IV SOLN
INTRAVENOUS | Status: DC | PRN
  Administered 2016-11-02: 15 mg via INTRAVENOUS

## 2016-11-02 MED ORDER — SODIUM CHLORIDE 0.9 % IV BOLUS (SEPSIS)
1000.0000 mL | Freq: Once | INTRAVENOUS | Status: AC
  Administered 2016-11-02: 1000 mL via INTRAVENOUS

## 2016-11-02 MED ORDER — MORPHINE SULFATE (PF) 4 MG/ML IV SOLN
INTRAVENOUS | Status: AC
  Administered 2016-11-02: 4 mg via INTRAVENOUS
  Filled 2016-11-02: qty 1

## 2016-11-02 MED ORDER — PHENYLEPHRINE HCL 10 MG/ML IJ SOLN
INTRAMUSCULAR | Status: DC | PRN
  Administered 2016-11-02: 100 ug via INTRAVENOUS

## 2016-11-02 MED ORDER — DEXAMETHASONE SODIUM PHOSPHATE 10 MG/ML IJ SOLN
INTRAMUSCULAR | Status: DC | PRN
  Administered 2016-11-02: 5 mg via INTRAVENOUS

## 2016-11-02 SURGICAL SUPPLY — 42 items
ADH SKN CLS APL DERMABOND .7 (GAUZE/BANDAGES/DRESSINGS) ×1
CANISTER SUCT 3000ML (MISCELLANEOUS) ×2 IMPLANT
CHLORAPREP W/TINT 26ML (MISCELLANEOUS) ×2 IMPLANT
CUTTER FLEX LINEAR 45M (STAPLE) ×2 IMPLANT
DERMABOND ADVANCED (GAUZE/BANDAGES/DRESSINGS) ×1
DERMABOND ADVANCED .7 DNX12 (GAUZE/BANDAGES/DRESSINGS) IMPLANT
DRSG TEGADERM 2-3/8X2-3/4 SM (GAUZE/BANDAGES/DRESSINGS) ×6 IMPLANT
DRSG TELFA 3X8 NADH (GAUZE/BANDAGES/DRESSINGS) ×2 IMPLANT
ELECT REM PT RETURN 9FT ADLT (ELECTROSURGICAL) ×2
ELECTRODE REM PT RTRN 9FT ADLT (ELECTROSURGICAL) ×1 IMPLANT
GLOVE BIO SURGEON STRL SZ7.5 (GLOVE) ×3 IMPLANT
GLOVE INDICATOR 8.0 STRL GRN (GLOVE) ×3 IMPLANT
GOWN STRL REUS W/ TWL LRG LVL3 (GOWN DISPOSABLE) ×2 IMPLANT
GOWN STRL REUS W/TWL LRG LVL3 (GOWN DISPOSABLE) ×4
GRASPER SUT TROCAR 14GX15 (MISCELLANEOUS) ×1 IMPLANT
IRRIGATION STRYKERFLOW (MISCELLANEOUS) ×1 IMPLANT
IRRIGATOR STRYKERFLOW (MISCELLANEOUS) ×2
IV NS 1000ML (IV SOLUTION) ×4
IV NS 1000ML BAXH (IV SOLUTION) ×2 IMPLANT
KIT RM TURNOVER STRD PROC AR (KITS) ×2 IMPLANT
NDL FILTER BLUNT 18X1 1/2 (NEEDLE) ×1 IMPLANT
NDL HYPO 25X1 1.5 SAFETY (NEEDLE) ×1 IMPLANT
NDL INSUFFLATION 14GA 120MM (NEEDLE) ×1 IMPLANT
NEEDLE FILTER BLUNT 18X 1/2SAF (NEEDLE) ×1
NEEDLE FILTER BLUNT 18X1 1/2 (NEEDLE) ×1 IMPLANT
NEEDLE HYPO 25X1 1.5 SAFETY (NEEDLE) ×2 IMPLANT
NEEDLE INSUFFLATION 14GA 120MM (NEEDLE) ×2 IMPLANT
NS IRRIG 500ML POUR BTL (IV SOLUTION) ×2 IMPLANT
PACK LAP CHOLECYSTECTOMY (MISCELLANEOUS) ×2 IMPLANT
PAD DRESSING TELFA 3X8 NADH (GAUZE/BANDAGES/DRESSINGS) ×1 IMPLANT
POUCH ENDO CATCH 10MM SPEC (MISCELLANEOUS) ×2 IMPLANT
RELOAD 45 VASCULAR/THIN (ENDOMECHANICALS) ×4 IMPLANT
RELOAD STAPLE 45 2.5 WHT GRN (ENDOMECHANICALS) ×1 IMPLANT
SCISSORS METZENBAUM CVD 33 (INSTRUMENTS) ×1 IMPLANT
SUT ETHILON 5-0 FS-2 18 BLK (SUTURE) ×4 IMPLANT
SUT VIC AB 0 CT2 27 (SUTURE) ×2 IMPLANT
SUTURE MNCRYL 4-0 (SUTURE) ×1 IMPLANT
SYRINGE 10CC LL (SYRINGE) ×2 IMPLANT
TROCAR XCEL 12X100 BLDLESS (ENDOMECHANICALS) ×2 IMPLANT
TROCAR Z-THREAD FIOS 11X100 BL (TROCAR) ×2 IMPLANT
TROCAR Z-THREAD SLEEVE 11X100 (TROCAR) ×2 IMPLANT
TUBING INSUFFLATOR HI FLOW (MISCELLANEOUS) ×2 IMPLANT

## 2016-11-02 NOTE — ED Provider Notes (Signed)
[facility] Emergency Department Provider Note   ____________________________________________   I have reviewed the triage vital signs and the nursing notes.   HISTORY  Chief Complaint Abdominal Pain   History limited by: Not Limited   HPI Kaylee Benson is a 45 y.o. female who presents to the emergency department today with acute appendicitis. The patient started having abdominal pain yesterday morning. The patient stated that initially the pain was throughout her stomach. It felt like multiple minor stabbing her. This morning when she woke up it is localized to the right lower quadrant. She saw her primary care doctor he got blood work which was concerning for a leukocytosis. She then underwent CT scan which was confirmatory for acute appendicitis.   Past Medical History:  Diagnosis Date  . Allergy    seasonal  . Breast cancer ([place]) right   radiation  . 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 Kidney stone   . Kidney stones   . PONV (postoperative nausea and vomiting)    nausea    Patient Active Problem List   Diagnosis Date Noted  . Abnormal finding on mammography 09/04/2015  . Personal history of urinary calculi 09/04/2015  . Depression 09/04/2015  . 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 Allergic rhinitis 02/16/2008    Past Surgical History:  Procedure Laterality Date  . AUGMENTATION MAMMAPLASTY    . BREAST BIOPSY Right 2016   +  . BREAST CAPSULECTOMY WITH IMPLANT EXCHANGE Right 01/30/2015   Procedure: BREAST CAPSULECTOMY WITH IMPLANT EXCHANGE;  Surgeon: Bea Graff, MD;  Location: [place];  Service: Plastics;  Laterality: Right;  . BREAST ENHANCEMENT SURGERY Bilateral   . BREAST LUMPECTOMY WITH NEEDLE LOCALIZATION Right 01/30/2015   Procedure: BREAST LUMPECTOMY WITH NEEDLE LOCALIZATION;  Surgeon: Erroll Luna, MD;  Location: [place];  Service: General;  Laterality: Right;  . BREAST SURGERY      bilateral breast implants 2001  . FOOT SURGERY Left   . MOUTH SURGERY    . ORIF ANKLE FRACTURE     left  . Surgery on left lower leg     2007    Prior to Admission medications   Medication Sig Start Date End Date Taking? Authorizing Provider  BIOTIN PO Take 1 tablet by mouth. Reported on 04/22/2016    Historical Provider, MD  Calcium Carbonate-Vitamin D (CALCIUM + D PO) Take by mouth once.    Historical Provider, MD  cyclobenzaprine (FLEXERIL) 5 MG tablet Take 1 tablet (5 mg total) by mouth 3 (three) times daily as needed for muscle spasms. 10/20/16   Vickki Muff Chrismon, PA  fluticasone (FLONASE) 50 MCG/ACT nasal spray Place 2 sprays into both nostrils daily. 10/08/16   Richard Maceo Pro., MD  loratadine (CLARITIN) 10 MG tablet Take 1 tablet (10 mg total) by mouth daily. 10/08/16   Richard Maceo Pro., MD  Multiple Vitamin (MULTIVITAMIN) tablet Take 1 tablet by mouth daily.    Historical Provider, MD  naproxen sodium (ANAPROX) 220 MG tablet Take 220 mg by mouth as needed.    Historical Provider, MD  ondansetron (ZOFRAN-ODT) 4 MG disintegrating tablet DISSOLVE 1 TO 2 TABLETS BY MOUTH EVERY 8 HOURS AS NEEDED FOR NAUSEA 11/01/16   Historical Provider, MD    Allergies Patient has no known allergies.  Family History  Problem Relation Age of Onset  . Breast cancer Mother 75  . Heart disease Mother   . Hypertension Mother  Social History Social History  Substance Use Topics  . Smoking status: Never Smoker  . Smokeless tobacco: Never Used  . Alcohol use Yes     Comment: Drinks glass of wine 1-2 a month    Review of Systems  Constitutional: Negative for fever. Cardiovascular: Negative for chest pain. Respiratory: Negative for shortness of breath. Gastrointestinal: Positive for right lower quadrant pain. Genitourinary: Negative for dysuria. Musculoskeletal: Negative for back pain. Skin: Negative for rash. Neurological: Negative for headaches, focal weakness or  numbness.  10-point ROS otherwise negative.  ____________________________________________   PHYSICAL EXAM:  VITAL SIGNS: ED Triage Vitals  Enc Vitals Group     BP 11/02/16 1859 (!) 119/96     Pulse Rate 11/02/16 1859 69     Resp 11/02/16 1859 18     Temp 11/02/16 1859 99.1 F (37.3 C)     Temp Source 11/02/16 1859 Oral     SpO2 11/02/16 1859 100 %     Weight 11/02/16 1900 127 lb (57.6 kg)     Height 11/02/16 1900 5' (1.524 m)     Head Circumference --      Peak Flow --      Pain Score 11/02/16 1900 8   Constitutional: Alert and oriented. Well appearing and in no distress. Eyes: Conjunctivae are normal. Normal extraocular movements. ENT   Head: Normocephalic and atraumatic.   Nose: No congestion/rhinnorhea.   Mouth/Throat: Mucous membranes are moist.   Neck: No stridor. Hematological/Lymphatic/Immunilogical: No cervical lymphadenopathy. Cardiovascular: Normal rate, regular rhythm.  No murmurs, rubs, or gallops.  Respiratory: Normal respiratory effort without tachypnea nor retractions. Breath sounds are clear and equal bilaterally. No wheezes/rales/rhonchi. Gastrointestinal: Soft and tender to palpation in the RLQ. Genitourinary: Deferred Musculoskeletal: Normal range of motion in all extremities. No lower extremity edema. Neurologic:  Normal speech and language. No gross focal neurologic deficits are appreciated.  Skin:  Skin is warm, dry and intact. No rash noted. Psychiatric: Mood and affect are normal. Speech and behavior are normal. Patient exhibits appropriate insight and judgment.  ____________________________________________    LABS (pertinent positives/negatives)  WBC 15   ____________________________________________   EKG  None  ____________________________________________    RADIOLOGY  CT abd/pel IMPRESSION: Acute appendicitis without  complication.  ___________________________________________   PROCEDURES  Procedures  ____________________________________________   INITIAL IMPRESSION / ASSESSMENT AND PLAN / ED COURSE  Pertinent labs & imaging results that were available during my care of the patient were reviewed by me and considered in my medical decision making (see chart for details).  Patient presents to the emergency department today with acute appendicitis. Will contact surgery. ____________________________________________   FINAL CLINICAL IMPRESSION(S) / ED DIAGNOSES  Final diagnoses:  Acute appendicitis, unspecified acute appendicitis type     Note: This dictation was prepared with Dragon dictation. Any transcriptional errors that result from this process are unintentional    Nance Pear, MD 11/02/16 2041

## 2016-11-02 NOTE — Progress Notes (Signed)
Patient: Kaylee Benson Female    DOB: 1971/08/07   45 y.o.   MRN: [id number] Visit Date: 11/02/2016  Today's Provider: Vernie Murders, PA   Chief Complaint  Patient presents with  . Abdominal Pain   Subjective:    Abdominal Pain  This is a new problem. The current episode started yesterday. The onset quality is sudden. The problem occurs constantly. The problem has been unchanged. The pain is located in the RLQ and suprapubic region. The quality of the pain is sharp (stabbing). The abdominal pain radiates to the pelvis. Associated symptoms include nausea and vomiting (7-8 episodes ). Treatments tried: Zofran. The treatment provided mild relief.  Patient was seen at [facility] on 11/01/2016 and diagnosed with gastroenteritis and prescribed Zofran. Patient reports medication helped with the vomiting but abdominal pain is still present. Worried about appendicitis or renal stone.   Past Medical History:  Diagnosis Date  . Allergy    seasonal  . Breast cancer ([place]) right   radiation  . 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 Kidney stone   . Kidney stones   . PONV (postoperative nausea and vomiting)    nausea   Past Surgical History:  Procedure Laterality Date  . AUGMENTATION MAMMAPLASTY    . BREAST BIOPSY Right 2016   +  . BREAST CAPSULECTOMY WITH IMPLANT EXCHANGE Right 01/30/2015   Procedure: BREAST CAPSULECTOMY WITH IMPLANT EXCHANGE;  Surgeon: Bea Graff, MD;  Location: [place];  Service: Plastics;  Laterality: Right;  . BREAST ENHANCEMENT SURGERY Bilateral   . BREAST LUMPECTOMY WITH NEEDLE LOCALIZATION Right 01/30/2015   Procedure: BREAST LUMPECTOMY WITH NEEDLE LOCALIZATION;  Surgeon: Erroll Luna, MD;  Location: [place];  Service: General;  Laterality: Right;  . BREAST SURGERY     bilateral breast implants 2001  . FOOT SURGERY Left   . MOUTH SURGERY    . ORIF ANKLE FRACTURE     left  . Surgery on left lower leg     2007    Family History  Problem Relation Age of Onset  . Breast cancer Mother 17  . Heart disease Mother   . Hypertension Mother    No Known Allergies   Previous Medications   BIOTIN PO    Take 1 tablet by mouth. Reported on 04/22/2016   CALCIUM CARBONATE-VITAMIN D (CALCIUM + D PO)    Take by mouth once.   CYCLOBENZAPRINE (FLEXERIL) 5 MG TABLET    Take 1 tablet (5 mg total) by mouth 3 (three) times daily as needed for muscle spasms.   FLUTICASONE (FLONASE) 50 MCG/ACT NASAL SPRAY    Place 2 sprays into both nostrils daily.   LORATADINE (CLARITIN) 10 MG TABLET    Take 1 tablet (10 mg total) by mouth daily.   MULTIPLE VITAMIN (MULTIVITAMIN) TABLET    Take 1 tablet by mouth daily.   NAPROXEN SODIUM (ANAPROX) 220 MG TABLET    Take 220 mg by mouth as needed.   ONDANSETRON (ZOFRAN-ODT) 4 MG DISINTEGRATING TABLET    DISSOLVE 1 TO 2 TABLETS BY MOUTH EVERY 8 HOURS AS NEEDED FOR NAUSEA    Review of Systems  Constitutional: Negative.   Respiratory: Negative.   Cardiovascular: Negative.   Gastrointestinal: Positive for abdominal pain, nausea and vomiting (7-8 episodes ).    Social History  Substance Use Topics  . Smoking status: Never Smoker  . Smokeless tobacco: Never Used  . Alcohol use Yes  Comment: Drinks glass of wine 1-2 a month   Objective:   BP (!) 88/58 (BP Location: Right Arm, Patient Position: Sitting, Cuff Size: Normal)   Pulse 86   Temp 99 F (37.2 C) (Oral)   Resp 14   Wt 127 lb 3.2 oz (57.7 kg)   LMP 10/13/2016   BMI 24.03 kg/m    Physical Exam  Constitutional: She is oriented to person, place, and time. She appears well-developed and well-nourished. No distress.  HENT:  Head: Normocephalic and atraumatic.  Right Ear: Hearing normal.  Left Ear: Hearing normal.  Nose: Nose normal.  Eyes: Conjunctivae and lids are normal. Right eye exhibits no discharge. Left eye exhibits no discharge. No scleral icterus.  Cardiovascular: Normal rate and regular rhythm.    Pulmonary/Chest: Effort normal and breath sounds normal. No respiratory distress.  Abdominal: Soft. Bowel sounds are normal. She exhibits no mass. There is tenderness.  RLQ tenderness. BS quiet but present. No masses felt. No CVA tenderness to percussion posteriorly. No rigidity.  Musculoskeletal: Normal range of motion.  Neurological: She is alert and oriented to person, place, and time.  Skin: Skin is intact. No lesion and no rash noted.  Psychiatric: She has a normal mood and affect. Her speech is normal and behavior is normal. Thought content normal.      Assessment & Plan:   1. Right lower quadrant abdominal pain Onset yesterday without dysuria. Progressed to nausea and vomiting without diarrhea. Went to [facility] yesterday and treated with Zofran. Urinalysis dipstick normal. Will get stat CT of abdomen/pelvis and stat CBC with CMP today to rule out appendicitis versus renal stone. May use Zofran TID prn. - POCT Urinalysis Dipstick - CT Abdomen Pelvis Wo Contrast - CBC with Differential/Platelet - Comprehensive metabolic panel

## 2016-11-02 NOTE — Anesthesia Procedure Notes (Signed)
Procedure Name: Intubation Date/Time: 11/02/2016 10:46 PM Performed by: Lendon Colonel Pre-anesthesia Checklist: Patient identified, Emergency Drugs available, Suction available, Patient being monitored and Timeout performed Patient Re-evaluated:Patient Re-evaluated prior to inductionOxygen Delivery Method: Circle system utilized Preoxygenation: Pre-oxygenation with 100% oxygen Intubation Type: IV induction, Rapid sequence and Cricoid Pressure applied Laryngoscope Size: Miller and 2 Grade View: Grade I Tube type: Oral Number of attempts: 1 Airway Equipment and Method: Stylet Placement Confirmation: ETT inserted through vocal cords under direct vision,  positive ETCO2 and breath sounds checked- equal and bilateral Secured at: 20 cm Tube secured with: Tape Dental Injury: Teeth and Oropharynx as per pre-operative assessment

## 2016-11-02 NOTE — Anesthesia Preprocedure Evaluation (Signed)
Anesthesia Evaluation  Patient identified by MRN, date of birth, ID band Patient awake    Reviewed: Allergy & Precautions, H&P , NPO status , Patient's Chart, lab work & pertinent test results, reviewed documented beta blocker date and time   History of Anesthesia Complications (+) PONV and history of anesthetic complications  Airway Mallampati: I  TM Distance: >3 FB Neck ROM: full    Dental no notable dental hx. (+) Teeth Intact   Pulmonary neg pulmonary ROS,    Pulmonary exam normal        Cardiovascular Exercise Tolerance: Good negative cardio ROS Normal cardiovascular exam Rhythm:regular Rate:Normal     Neuro/Psych PSYCHIATRIC DISORDERS (Depression) negative neurological ROS     GI/Hepatic negative GI ROS, Neg liver ROS,   Endo/Other  negative endocrine ROS  Renal/GU Renal disease (kidney stones)  negative genitourinary   Musculoskeletal   Abdominal   Peds  Hematology negative hematology ROS (+)   Anesthesia Other Findings Past Medical History: No date: Allergy     Comment: seasonal right: Breast cancer ([place])     Comment: radiation 04/15/2015: Carcinoma in situ of breast No date: Kidney stone No date: Kidney stones No date: PONV (postoperative nausea and vomiting)     Comment: nausea   Reproductive/Obstetrics negative OB ROS                             Anesthesia Physical Anesthesia Plan  ASA: II  Anesthesia Plan: General, Rapid Sequence and Cricoid Pressure   Post-op Pain Management:    Induction:   Airway Management Planned:   Additional Equipment:   Intra-op Plan:   Post-operative Plan:   Informed Consent: I have reviewed the patients History and Physical, chart, labs and discussed the procedure including the risks, benefits and alternatives for the proposed anesthesia with the patient or authorized representative who has indicated his/her understanding and  acceptance.   Dental Advisory Given  Plan Discussed with: Anesthesiologist, CRNA and Surgeon  Anesthesia Plan Comments:         Anesthesia Quick Evaluation

## 2016-11-02 NOTE — ED Notes (Signed)
Informed consent signed, placed on chart

## 2016-11-02 NOTE — Transfer of Care (Signed)
Immediate Anesthesia Transfer of Care Note  Patient: Kaylee Benson  Procedure(s) Performed: Procedure(s): APPENDECTOMY LAPAROSCOPIC (N/A)  Patient Location: PACU  Anesthesia Type:General  Level of Consciousness: sedated and patient cooperative  Airway & Oxygen Therapy: Patient Spontanous Breathing  Post-op Assessment: Report given to RN and Post -op Vital signs reviewed and stable  Post vital signs: Reviewed and stable  Last Vitals:  Vitals:   11/02/16 2107 11/02/16 2142  BP: 104/73 106/67  Pulse: 75 77  Resp:  14  Temp:      Last Pain:  Vitals:   11/02/16 1945  TempSrc:   PainSc: 4          Complications: No apparent anesthesia complications

## 2016-11-02 NOTE — H&P (Signed)
Kaylee Benson is a 45 y.o. female  with 2436 hrs. of significant abdominal pain.  HPI: She was in her normal state of good health until approximately 36 hours ago which began to develop some significant generalized abdominal pain primarily in the right upper quadrant periumbilical area. She has some mild nausea and vomiting. She felt lethargic and did not have any fever. She continued to have these symptoms which coalesced into more significant right lower quadrant pain. She went to the walk-in clinic and was treated for possible gastroenteritis. Her symptoms persisted and she presented to her primary care doctor today who recommended further evaluation with CT scan. She had a mild elevated white blood cell count of 15,000 and CT scan revealed a markedly distended and inflamed edematous appendix consistent with acute appendicitis.  She's recently had some low back pain which was diagnosed as a possible sacroiliac joint problem. She is wondering if the symptoms were not precursors to the current abdominal problem. She denies any diarrhea or constipation. She's not have any other significant history. She's had a previous Essure procedure for fertility. She denies any history of hepatitis yellow jaundice pancreatitis peptic ulcer disease gallbladder disease or diverticulitis. She has no other major medical problems.  Past Medical History:  Diagnosis Date  . Allergy    seasonal  . Breast cancer ([place]) right   radiation  . 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 Kidney stone   . Kidney stones   . PONV (postoperative nausea and vomiting)    nausea   Past Surgical History:  Procedure Laterality Date  . AUGMENTATION MAMMAPLASTY    . BREAST BIOPSY Right 2016   +  . BREAST CAPSULECTOMY WITH IMPLANT EXCHANGE Right 01/30/2015   Procedure: BREAST CAPSULECTOMY WITH IMPLANT EXCHANGE;  Surgeon: Bea Graff, MD;  Location: [place];  Service: Plastics;  Laterality: Right;  . BREAST ENHANCEMENT  SURGERY Bilateral   . BREAST LUMPECTOMY WITH NEEDLE LOCALIZATION Right 01/30/2015   Procedure: BREAST LUMPECTOMY WITH NEEDLE LOCALIZATION;  Surgeon: Erroll Luna, MD;  Location: [place];  Service: General;  Laterality: Right;  . BREAST SURGERY     bilateral breast implants 2001  . FOOT SURGERY Left   . MOUTH SURGERY    . ORIF ANKLE FRACTURE     left  . Surgery on left lower leg     2007   Social History   Social History  . Marital status: Married    Spouse name: N/A  . Number of children: N/A  . Years of education: N/A   Social History Main Topics  . Smoking status: Never Smoker  . Smokeless tobacco: Never Used  . Alcohol use Yes     Comment: Drinks glass of wine 1-2 a month  . Drug use: No  . Sexual activity: Yes    Birth control/ protection: Other-see comments     Comment: Essure procedure done 1 year ago, menarche age 41, G68, P 58, maternal age 55-25   Other Topics Concern  . None   Social History Narrative  . None     Review of Systems  Constitutional: Negative for chills, fever and weight loss.  HENT: Negative.   Eyes: Negative.   Respiratory: Negative for cough, sputum production, shortness of breath and wheezing.   Cardiovascular: Negative for chest pain and palpitations.  Gastrointestinal: Positive for abdominal pain, nausea and vomiting. Negative for constipation, diarrhea and heartburn.  Genitourinary: Positive for flank pain.  Skin: Negative.  Negative for rash.  Neurological: Negative.   Psychiatric/Behavioral: Negative.      PHYSICAL EXAM: BP (!) 119/96 (BP Location: Left Arm)   Pulse 69   Temp 99.1 F (37.3 C) (Oral)   Resp 18   Ht 5' (1.524 m)   Wt 57.6 kg (127 lb)   LMP 10/13/2016   SpO2 100%   BMI 24.80 kg/m   Physical Exam  Constitutional: She is oriented to person, place, and time. She appears well-developed and well-nourished. No distress.  HENT:  Head: Normocephalic and atraumatic.  Eyes: EOM are normal. Pupils  are equal, round, and reactive to light.  Neck: Normal range of motion. Neck supple.  Cardiovascular: Normal rate, regular rhythm and normal heart sounds.   Pulmonary/Chest: Effort normal and breath sounds normal. No respiratory distress. She has no wheezes.  Abdominal: Soft. Bowel sounds are normal. She exhibits no distension and no mass. There is tenderness. There is rebound and guarding.  Musculoskeletal: Normal range of motion. She exhibits no edema or deformity.  Neurological: She is alert and oriented to person, place, and time.  Skin: Skin is warm and dry. She is not diaphoretic.  Psychiatric: She has a normal mood and affect. Her behavior is normal.   Her abdomen is soft but she does have marked right lower quadrant tenderness she has tenderness rebound tenderness and referred rebound tenderness.  Impression/Plan: I independently reviewed her CT scan. She does have markedly distended appendix with evidence of periappendiceal fluid and wall thickening consistent with acute appendicitis. I outlined the options for intervention including primary antibiotic therapy versus surgical intervention. After discussion with her family they have chosen surgical intervention. We will moved to the operating room as soon as an operating room is available. Risks benefits and options of been outlined to the patient in detail.. They are in agreement.   Dia Crawford III, MD  11/02/2016, 8:31 PM

## 2016-11-02 NOTE — ED Triage Notes (Signed)
Patient presents to the ED with right lower quadrant pain.  Patient had CT scan today which showed acute appendicitis.  Patient is in no obvious distress at this time. Patient states, "I knew something was wrong with me."

## 2016-11-02 NOTE — Op Note (Signed)
11/02/2016  11:40 PM  PATIENT:  Kaylee Benson  45 y.o. female  PRE-OPERATIVE DIAGNOSIS:  acute appendicitis  POST-OPERATIVE DIAGNOSIS:  acute appendicitis  PROCEDURE:  Procedure(s): APPENDECTOMY LAPAROSCOPIC (N/A)  SURGEON:  Surgeon(s) and Role:    * Jeanie Cooks, MD - Primary   ASSISTANTS: none   ANESTHESIA:   general  EBL:  Total I/O In: -  Out: 15 [Blood:15]   DRAINS: none   LOCAL MEDICATIONS USED:  MARCAINE      DISPOSITION OF SPECIMEN:  PATHOLOGY   DICTATION: .Dragon Dictation with the patient in the supine position and after induction of appropriate general anesthesia the patient's abdomen was prepped ChloraPrep and draped sterile towels. The patient was placed in the headdown feet up position. Small infraumbilical incision was made standard fashion and carried down bluntly through the subcutaneous tissue. A varies needle was used cannulate peritoneal cavity. CO2 was insufflated to appropriate pressure measurements.  When approximately 2 L of CO2 were instilled a varies needle was withdrawn and 11 mm port placed into the peritoneal cavity. Intraperitoneal position was confirmed and CO2 was reinsufflated. The abdomen was inspected. A thickened inflamed nonruptured appendix was identified in the right lower quadrant with significant surrounding inflammatory change. Transverse midepigastric incision was made and a 12 mm port inserted under direct vision. A suprapubic incision was made and a 12 mm port was inserted under direct vision. The camera was moved to the upper port and dissection carried out through the 2 lower ports.  The mesoappendix was identified and divided with the application Endo GIA stapling device carrying a white load. The base of the appendix is involved in the infection so a quarter of the cecum was removed with the appendix stapled across here using a single application of the Endo GIA stapling device carrying a blue load. Appendix was captured Endo  Catch apparatus removed through the suprapubic incision without difficulty.  The area was copiously suction irrigated. The lower midline incision was closed with 2 sutures of 0 Vicryl using the suture passer. The abdomen was then desufflated. All ports withdrawn without difficulty. The umbilical fascia was closed with figure-of-eight suture of 0 Vicryl. Skin was closed with 4-0 Monocryl and Dermabond. The patient was awakened and returned recovery room having tolerated the procedure well. Sponge instrument needle count were correct 2 in the operating room.  PLAN OF CARE: Admit for overnight observation  PATIENT DISPOSITION:  PACU - hemodynamically stable.   Dia Crawford III, MD

## 2016-11-03 MED ORDER — ENOXAPARIN SODIUM 40 MG/0.4ML ~~LOC~~ SOLN
40.0000 mg | SUBCUTANEOUS | Status: DC
  Administered 2016-11-03: 40 mg via SUBCUTANEOUS
  Filled 2016-11-03: qty 0.4

## 2016-11-03 MED ORDER — ACETAMINOPHEN 325 MG PO TABS
650.0000 mg | ORAL_TABLET | Freq: Four times a day (QID) | ORAL | Status: DC | PRN
  Administered 2016-11-03 – 2016-11-04 (×3): 650 mg via ORAL
  Filled 2016-11-03 (×3): qty 2

## 2016-11-03 MED ORDER — ONDANSETRON 4 MG PO TBDP: 4.0000 mg | ORAL_TABLET | Freq: Four times a day (QID) | ORAL | Status: DC | PRN

## 2016-11-03 MED ORDER — ONDANSETRON HCL 4 MG/2ML IJ SOLN
4.0000 mg | Freq: Four times a day (QID) | INTRAMUSCULAR | Status: DC | PRN
Start: 2016-11-03 — End: 2016-11-04

## 2016-11-03 MED ORDER — ACETAMINOPHEN 650 MG RE SUPP: 650.0000 mg | Freq: Four times a day (QID) | RECTAL | Status: DC | PRN

## 2016-11-03 MED ORDER — HYDROCODONE-ACETAMINOPHEN 5-325 MG PO TABS: 1.0000 | ORAL_TABLET | ORAL | 0 refills | Status: DC | PRN

## 2016-11-03 MED ORDER — IBUPROFEN 400 MG PO TABS: 800.0000 mg | ORAL_TABLET | Freq: Four times a day (QID) | ORAL | Status: DC | PRN

## 2016-11-03 MED ORDER — HYDROMORPHONE HCL 1 MG/ML IJ SOLN: 1.0000 mg | INTRAMUSCULAR | Status: DC | PRN

## 2016-11-03 MED ORDER — IBUPROFEN 800 MG PO TABS: 800.0000 mg | ORAL_TABLET | Freq: Four times a day (QID) | ORAL | 0 refills | Status: DC | PRN

## 2016-11-03 MED ORDER — AMOXICILLIN-POT CLAVULANATE 875-125 MG PO TABS: 1.0000 | ORAL_TABLET | Freq: Two times a day (BID) | ORAL | 0 refills | Status: DC

## 2016-11-03 MED ORDER — KCL IN DEXTROSE-NACL 20-5-0.45 MEQ/L-%-% IV SOLN
INTRAVENOUS | Status: DC
  Administered 2016-11-03: 01:00:00 via INTRAVENOUS
  Administered 2016-11-03: 1000 mL via INTRAVENOUS
  Administered 2016-11-04: 03:00:00 via INTRAVENOUS
  Filled 2016-11-03 (×5): qty 1000

## 2016-11-03 MED ORDER — HYDROCODONE-ACETAMINOPHEN 5-325 MG PO TABS
1.0000 | ORAL_TABLET | ORAL | Status: DC | PRN
  Administered 2016-11-03: 1 via ORAL
  Filled 2016-11-03: qty 1

## 2016-11-03 NOTE — Anesthesia Postprocedure Evaluation (Signed)
Anesthesia Post Note  Patient: Kaylee Benson  Procedure(s) Performed: Procedure(s) (LRB): APPENDECTOMY LAPAROSCOPIC (N/A)  Patient location during evaluation: PACU Anesthesia Type: General Level of consciousness: awake and alert Pain management: pain level controlled Vital Signs Assessment: post-procedure vital signs reviewed and stable Respiratory status: spontaneous breathing, nonlabored ventilation, respiratory function stable and patient connected to nasal cannula oxygen Cardiovascular status: blood pressure returned to baseline and stable Postop Assessment: no signs of nausea or vomiting Anesthetic complications: no    Last Vitals:  Vitals:   11/03/16 0012 11/03/16 0031  BP: 106/64 105/65  Pulse: 78 83  Resp: 13 20  Temp:  36.6 C    Last Pain:  Vitals:   11/03/16 0031  TempSrc: Oral  PainSc:                  Martha Clan

## 2016-11-03 NOTE — Progress Notes (Signed)
Subjective:   She is awake and reasonably comfortable. She denies any significant abdominal pain. She does have some incisional tenderness. She's not nauseated and has not vomited. She denies any fever. She has voided.   Vital signs in last 24 hours: Temp:  [97.6 F (36.4 C)-99.1 F (37.3 C)] 98.2 F (36.8 C) (12/12 0434) Pulse Rate:  [69-99] 74 (12/12 0434) Resp:  [13-20] 18 (12/12 0434) BP: (88-119)/(49-96) 90/49 (12/12 0434) SpO2:  [96 %-100 %] 100 % (12/12 0434) Weight:  [54.4 kg (120 lb)-57.7 kg (127 lb 3.2 oz)] 54.4 kg (120 lb) (12/12 0031) Last BM Date: 11/02/16  Intake/Output from previous day: 12/11 0701 - 12/12 0700 In: 1300 [I.V.:1300] Out: 590 [Urine:575; Blood:15]  Exam:  Her abdomen is unremarkable. Her wounds look good with no evidence of any bleeding.  Lab Results:  CBC  Recent Labs  11/02/16 1518  WBC 15.0*  HGB 12.0  HCT 36.5  PLT 279   CMP     Component Value Date/Time   NA 137 11/02/2016 1518   NA 142 10/14/2016 0823   K 3.7 11/02/2016 1518   CL 103 11/02/2016 1518   CO2 25 11/02/2016 1518   GLUCOSE 93 11/02/2016 1518   BUN 22 (H) 11/02/2016 1518   BUN 15 10/14/2016 0823   CREATININE 0.95 11/02/2016 1518   CALCIUM 9.2 11/02/2016 1518   PROT 7.9 11/02/2016 1518   PROT 6.9 10/14/2016 0823   ALBUMIN 4.4 11/02/2016 1518   ALBUMIN 4.4 10/14/2016 0823   AST 21 11/02/2016 1518   ALT 19 11/02/2016 1518   ALKPHOS 53 11/02/2016 1518   BILITOT 1.0 11/02/2016 1518   BILITOT 0.2 10/14/2016 0823   GFRNONAA >60 11/02/2016 1518   GFRAA >60 11/02/2016 1518   PT/INR No results for input(s): LABPROT, INR in the last 72 hours.  Studies/Results: Ct Abdomen Pelvis W Contrast  Addendum Date: 11/02/2016   ADDENDUM REPORT: 11/02/2016 18:27 ADDENDUM: Acute findings discussed with and reconfirmed by Dr.Gilbert on 11/02/2016 at 6:27 pm. Electronically Signed   By: Elon Alas M.D.   On: 11/02/2016 18:27   Addendum Date: 11/02/2016   ADDENDUM REPORT:  11/02/2016 18:23 ADDENDUM: Acute findings discussed with Suanne Marker, nurse practitioner for Dr. Rosanna Randy on November 02, 2016 at 1820 hours ; she will have Dr. Rosanna Randy directly call interpreting radiologist. Electronically Signed   By: Elon Alas M.D.   On: 11/02/2016 18:23   Result Date: 11/02/2016 CLINICAL DATA:  Diffuse abdominal pain, RIGHT lower quadrant pain today. Nausea and vomiting beginning yesterday. History of breast cancer. EXAM: CT ABDOMEN AND PELVIS WITH CONTRAST TECHNIQUE: Multidetector CT imaging of the abdomen and pelvis was performed using the standard protocol following bolus administration of intravenous contrast. CONTRAST:  53mL ISOVUE-300 IOPAMIDOL (ISOVUE-300) INJECTION 61% COMPARISON:  None. FINDINGS: LOWER CHEST: Lung bases are clear. Included heart size is normal. No pericardial effusion. HEPATOBILIARY: Liver and gallbladder are normal. PANCREAS: Normal. SPLEEN: Normal. ADRENALS/URINARY TRACT: Kidneys are orthotopic, demonstrating symmetric enhancement. No nephrolithiasis, hydronephrosis or solid renal masses. The unopacified ureters are normal in course and caliber. Delayed imaging through the kidneys demonstrates symmetric prompt contrast excretion within the proximal urinary collecting system. Urinary bladder is partially distended and unremarkable. Normal adrenal glands. STOMACH/BOWEL: The stomach, small and large bowel are normal in course and caliber without inflammatory changes. The appendix is enlarged, 16 mm with wall thickening and periappendiceal inflammation. Multiple subcentimeter appendicolith. VASCULAR/LYMPHATIC: Aortoiliac vessels are normal in course and caliber. Prominent pelvic veins and LEFT gonadal vein. No  lymphadenopathy by CT size criteria. REPRODUCTIVE: Normal. Thin walled enhancing 2.3 cm RIGHT corpus luteal cyst. Bilateral Essure devices. OTHER: Small amount of free fluid in RIGHT pelvis. MUSCULOSKELETAL: Nonacute.  Mild RIGHT sacroiliac osteoarthrosis.  IMPRESSION: Acute appendicitis without complication. Electronically Signed: By: Elon Alas M.D. On: 11/02/2016 18:06    Assessment/Plan: She continues to improve. With the severity of her pansinusitis I would recommend IV antibiotics most of today and then tentatively plan discharge in the morning. She is in agreement with this plan.

## 2016-11-03 NOTE — Progress Notes (Signed)
45 yr old female with acute appendicitis POD#1 from Lap appy.  Patient doing well.  She states abdominal pain improved.  She has tolerated a regular diet and been up to the bathroom.   Vitals:   11/03/16 1308 11/03/16 1309  BP: (!) 87/48 (!) 87/48  Pulse: 82 87  Resp:    Temp:     I/O last 3 completed shifts: In: 1300 [I.V.:1300] Out: 590 [Urine:575; Blood:15] Total I/O In: 360 [P.O.:360] Out: 1400 [Urine:1400]   PE:  Gen: NAD Abd: soft, incisions c/d/i, appropriately tender Ext: 2+ pulses, no edema  A/P:  45 yr old female with acute appendicitis POD#1 from Lap appy.  While she did not have a rupture, there was some appendiceal necrosis with significant surrounding inflammation according to Dr. Pat Patrick, the operative surgeon.  She also has some lower blood pressures but mentating well and no dizziness.   Will keep her on fluids and antibiotics, she likely will stay tonight, but in case Dr. Pat Patrick feels she is ok to go, Janeal Holmes given her husband the narcotic Rx and sent Augmentin in to the pharmacy as well.  She will be assessed later tonight by Dr. Pat Patrick.

## 2016-11-04 ENCOUNTER — Encounter: Payer: Self-pay | Admitting: Surgery

## 2016-11-04 LAB — SURGICAL PATHOLOGY

## 2016-11-04 MED ORDER — MENTHOL 3 MG MT LOZG
1.0000 | LOZENGE | OROMUCOSAL | Status: DC | PRN
  Administered 2016-11-04: 3 mg via ORAL
  Filled 2016-11-04: qty 9

## 2016-11-04 NOTE — Discharge Instructions (Signed)
Laparoscopic Appendectomy  °Care After  ° ° °These instructions give you information on caring for yourself after your procedure. Your doctor may also give you more specific instructions. Call your doctor if you have any problems or questions after your procedure.  °HOME CARE  °Do not drive while taking pain medicine (narcotics).  °Take medicine (stool softener) if you cannot poop (constipated).  °Change your bandages (dressings) as told by your doctor.  °Keep your wounds clean and dry. Wash the wounds gently with soap and water. Gently pat the wounds dry with a clean towel.  °Do not take baths, swim, or use hot tubs for 10 days, or as told by your doctor.  °Only take medicine as told by your doctor.  °Continue your normal diet as told by your doctor.  °Do not lift more than 10 pounds (4.5 kilograms) or play contact sports for 3 weeks, or as told by your doctor.  °Slowly increase your activity.  °Take deep breaths to avoid a lung infection (pneumonia). °GET HELP RIGHT AWAY IF:  °You have a fever >101 °You have a rash.  °You have trouble breathing or sharp chest pain.  °You have a reaction to the medicine you are taking.  °Your wound is red, puffy (swollen), or painful.  °You have yellowish-white fluid (pus) coming from the wound.  °You have fluid coming from the wound for longer than 1 day.  °You notice a bad smell coming from the wound or bandage.  °Your wound breaks open after stitches (sutures) or staples are removed.  °You have pain in the shoulders or shoulder blades.   °You are short of breath.  °You feel sick to your stomach (nauseous) or throw up (vomit).  °MAKE SURE YOU:  °Understand these instructions.  °Will watch your condition.  °Will get help right away if you are not doing well or get worse. ° °

## 2016-11-04 NOTE — Discharge Summary (Signed)
Patient ID: Kaylee Benson MRN: [id number] DOB/AGE: December 24, 1970 45 y.o.  Admit date: 11/02/2016 Discharge date: 11/04/2016  Discharge Diagnoses:  Acute appendicitis  Procedures Performed: Laparoscopic appendectomygood  Discharged Condition: good  Hospital Course: She was admitted with signs and symptoms consistent with possible acute appendicitis. Laboratory workup and imaging confirms the diagnosis. Surgical options versus antibiotic options were outlined to the patient in detail and she shows surgical intervention. After appropriate preoperative preparation informed consent she was taken to surgery on the evening of 11/02/2016. She underwent laparoscopic appendectomy without difficulty. The procedure was uncomplicated. She has no significant postoperative problems. She's up active tolerating a diet with no complaints. She'll be discharged home for follow-up in the office in 7-10 days time. Bathing activity and driving instructions were given the patient  Discharge Orders: Discharge Instructions    Diet - low sodium heart healthy    Complete by:  As directed    Driving Restrictions    Complete by:  As directed    Do not drive while taking pain medication   Increase activity slowly    Complete by:  As directed    Lifting restrictions    Complete by:  As directed    Do not lift anything heavier than your dinner plate until your follow-up office visit      Disposition: 01-Home or Self Care  Discharge Medications:  Current Facility-Administered Medications:  .  acetaminophen (TYLENOL) tablet 650 mg, 650 mg, Oral, Q6H PRN, 650 mg at 11/04/16 0534 **OR** acetaminophen (TYLENOL) suppository 650 mg, 650 mg, Rectal, Q6H PRN, Dia Crawford III, MD .  dextrose 5 % and 0.45 % NaCl with KCl 20 mEq/L infusion, , Intravenous, Continuous, Dia Crawford III, MD, Last Rate: 75 mL/hr at 11/04/16 0302 .  enoxaparin (LOVENOX) injection 40 mg, 40 mg, Subcutaneous, Q24H, Dia Crawford III, MD, 40 mg at 11/03/16  1349 .  HYDROcodone-acetaminophen (NORCO/VICODIN) 5-325 MG per tablet 1-2 tablet, 1-2 tablet, Oral, Q4H PRN, Dia Crawford III, MD, 1 tablet at 11/03/16 0050 .  HYDROmorphone (DILAUDID) injection 1 mg, 1 mg, Intravenous, Q2H PRN, Dia Crawford III, MD .  ibuprofen (ADVIL,MOTRIN) tablet 800 mg, 800 mg, Oral, Q6H PRN, Hubbard Robinson, MD .  menthol-cetylpyridinium (CEPACOL) lozenge 3 mg, 1 lozenge, Oral, Q1H PRN, Dia Crawford III, MD, 3 mg at 11/04/16 0405 .  ondansetron (ZOFRAN-ODT) disintegrating tablet 4 mg, 4 mg, Oral, Q6H PRN **OR** ondansetron (ZOFRAN) injection 4 mg, 4 mg, Intravenous, Q6H PRN, Dia Crawford III, MD .  piperacillin-tazobactam (ZOSYN) IVPB 3.375 g, 3.375 g, Intravenous, Q8H, Dia Crawford III, MD, 3.375 g at 11/04/16 0529  Follwup: Follow-up [place] Follow up in 1 week(s).   Specialty:  General Surgery Contact information: [address] [place]          Signed: Dia Crawford III 11/04/2016, 6:06 AM

## 2016-11-04 NOTE — Progress Notes (Signed)
Patient discharge teaching given, including activity, diet, follow-up appoints, and medications. Patient verbalized understanding of all discharge instructions. IV access was d/c'd. Vitals are stable. Skin is intact except as charted in most recent assessments. Pt to be escorted out by volunteer, to be driven home by husband.  Stevana Dufner [employer]

## 2016-11-09 ENCOUNTER — Ambulatory Visit (INDEPENDENT_AMBULATORY_CARE_PROVIDER_SITE_OTHER): Payer: BLUE CROSS/BLUE SHIELD | Admitting: Surgery

## 2016-11-09 ENCOUNTER — Encounter: Payer: Self-pay | Admitting: Surgery

## 2016-11-09 VITALS — BP 113/77 | HR 78 | Temp 97.9°F | Ht 61.0 in | Wt 132.5 lb

## 2016-11-09 DIAGNOSIS — Z9049 Acquired absence of other specified parts of digestive tract: Secondary | ICD-10-CM

## 2016-11-09 NOTE — Progress Notes (Signed)
11/09/2016  HPI: Patient is status post laparoscopic appendectomy by Dr. Pat Patrick on 12/11. She presents today for follow-up appointment. She reports that she's been doing well with no fevers, chills, worsening pain. She does describe feeling slightly weak with intermittent nausea initially after surgery but that has since improved.  Vital signs: BP 113/77   Pulse 78   Temp 97.9 F (36.6 C) (Oral)   Ht 5\' 1"  (1.549 m)   Wt 60.1 kg (132 lb 8 oz)   LMP 10/13/2016 Comment: negative upreg  BMI 25.04 kg/m    Physical Exam: Constitutional: No acute distress Abdomen:  Soft, nondistended, nontender to palpation. Incisions are all clean dry and intact with Dermabond.  Assessment/Plan: 45 year old female status post laparoscopic appendectomy.  -Have reassured the patient and her symptoms will continue to improve she continues to recover from her surgery.  Currently no diet restrictions. She may shower and bathe. -She still has a no heavy lifting restriction of more than 10-15 pounds until 1/8. -Patient may follow-up with [place] on an as-needed basis   Melvyn Neth, [place]

## 2016-11-09 NOTE — Patient Instructions (Signed)
Please call our office if you have any questions or concerns.  

## 2016-11-25 ENCOUNTER — Ambulatory Visit
Admission: RE | Admit: 2016-11-25 | Discharge: 2016-11-25 | Disposition: A | Discharge status: Home / Self Care | Payer: BLUE CROSS/BLUE SHIELD | Source: Ambulatory Visit | Attending: Radiation Oncology | Admitting: Radiation Oncology

## 2016-11-25 ENCOUNTER — Encounter: Payer: Self-pay | Admitting: Radiation Oncology

## 2016-11-25 VITALS — BP 115/75 | HR 86 | Temp 97.1°F | Resp 18 | Wt 126.4 lb

## 2016-11-25 DIAGNOSIS — Z170 Estrogen receptor positive status [ER+]: Secondary | ICD-10-CM | POA: Insufficient documentation

## 2016-11-25 DIAGNOSIS — Z923 Personal history of irradiation: Secondary | ICD-10-CM | POA: Insufficient documentation

## 2016-11-25 DIAGNOSIS — D0511 Intraductal carcinoma in situ of right breast: Secondary | ICD-10-CM | POA: Diagnosis not present

## 2016-11-25 NOTE — Progress Notes (Signed)
Radiation Oncology Follow up Note  Name: Kaylee Benson   Date:   11/25/2016 MRN:  [id number] DOB: 11-Dec-1970    This 46 y.o. female presents to the clinic today for 18 month follow-up status post whole breast radiation to her right breast for ductal carcinoma in situ ER/PR positive.  REFERRING PROVIDER: Erroll Luna, MD  HPI: Patient is a 46 year old female now out a year and a half having completed whole breast radiation to her right breast for ER/PR positive ductal carcinoma in situ. She has bilateral breast implants. And is doing well. She specifically denies any contraction around her implant. She has stopped tamoxifen based on chest pain I've asked her to readdress that with medical oncology.. She has mammogram scheduled in February. She recently had a ruptured appendix and has recovered well from that.  COMPLICATIONS OF TREATMENT: none  FOLLOW UP COMPLIANCE: keeps appointments   PHYSICAL EXAM:  BP 115/75   Pulse 86   Temp 97.1 F (36.2 C)   Resp 18   Wt 126 lb 6.9 oz (57.3 kg)   LMP 10/13/2016 Comment: negative upreg  BMI 23.89 kg/m  Lungs are clear to A&P cardiac examination essentially unremarkable with regular rate and rhythm. No dominant mass or nodularity is noted in either breast in 2 positions examined. Incision is well-healed. No axillary or supraclavicular adenopathy is appreciated. Cosmetic result is excellent. Patient has bilateral breast implants. Well-developed well-nourished patient in NAD. HEENT reveals PERLA, EOMI, discs not visualized.  Oral cavity is clear. No oral mucosal lesions are identified. Neck is clear without evidence of cervical or supraclavicular adenopathy. Lungs are clear to A&P. Cardiac examination is essentially unremarkable with regular rate and rhythm without murmur rub or thrill. Abdomen is benign with no organomegaly or masses noted. Motor sensory and DTR levels are equal and symmetric in the upper and lower extremities. Cranial nerves II  through XII are grossly intact. Proprioception is intact. No peripheral adenopathy or edema is identified. No motor or sensory levels are noted. Crude visual fields are within normal range.  RADIOLOGY RESULTS: Mammograms of February will be reviewed  PLAN: At the present time she is doing well with no evidence of disease. I'm please were overall progress. I've asked to see her back in 1 year for follow-up. I've asked her to rediscuss possible other antiestrogen therapies with medical oncology. I've made sure she has continuity of care and medical oncology. Patient is to call with any concerns.  I would like to take this opportunity to thank you for allowing me to participate in the care of your patient.Armstead Peaks., MD

## 2016-12-17 DIAGNOSIS — Z01419 Encounter for gynecological examination (general) (routine) without abnormal findings: Secondary | ICD-10-CM | POA: Diagnosis not present

## 2016-12-17 DIAGNOSIS — Z6824 Body mass index (BMI) 24.0-24.9, adult: Secondary | ICD-10-CM | POA: Diagnosis not present

## 2017-01-06 DIAGNOSIS — F439 Reaction to severe stress, unspecified: Secondary | ICD-10-CM | POA: Diagnosis not present

## 2017-01-07 ENCOUNTER — Ambulatory Visit
Admission: RE | Admit: 2017-01-07 | Discharge: 2017-01-07 | Disposition: A | Discharge status: Home / Self Care | Payer: BLUE CROSS/BLUE SHIELD | Source: Ambulatory Visit | Attending: Family Medicine | Admitting: Family Medicine

## 2017-01-07 DIAGNOSIS — R928 Other abnormal and inconclusive findings on diagnostic imaging of breast: Secondary | ICD-10-CM | POA: Diagnosis not present

## 2017-01-07 DIAGNOSIS — D0591 Unspecified type of carcinoma in situ of right breast: Secondary | ICD-10-CM

## 2017-01-07 DIAGNOSIS — N644 Mastodynia: Secondary | ICD-10-CM | POA: Diagnosis not present

## 2017-01-18 DIAGNOSIS — F439 Reaction to severe stress, unspecified: Secondary | ICD-10-CM | POA: Diagnosis not present

## 2017-01-25 DIAGNOSIS — F439 Reaction to severe stress, unspecified: Secondary | ICD-10-CM | POA: Diagnosis not present

## 2017-02-08 DIAGNOSIS — F439 Reaction to severe stress, unspecified: Secondary | ICD-10-CM | POA: Diagnosis not present

## 2017-02-17 DIAGNOSIS — F439 Reaction to severe stress, unspecified: Secondary | ICD-10-CM | POA: Diagnosis not present

## 2017-02-25 DIAGNOSIS — F439 Reaction to severe stress, unspecified: Secondary | ICD-10-CM | POA: Diagnosis not present

## 2017-03-03 DIAGNOSIS — F439 Reaction to severe stress, unspecified: Secondary | ICD-10-CM | POA: Diagnosis not present

## 2017-03-12 DIAGNOSIS — F439 Reaction to severe stress, unspecified: Secondary | ICD-10-CM | POA: Diagnosis not present

## 2017-04-07 DIAGNOSIS — F439 Reaction to severe stress, unspecified: Secondary | ICD-10-CM | POA: Diagnosis not present

## 2017-04-21 NOTE — Progress Notes (Signed)
Hematology/Oncology Consult note [facility]  Telephone:([phone] [phone]  Patient Care Team: Jerrol Banana., MD as PCP - General (Family Medicine)   Name of the patient: Kaylee Benson  [id number]  07-21-1971   Date of visit: 04/21/17  Diagnosis- right breast dcis  Chief complaint/ Reason for visit- routine f/u  Heme/Onc history: Patient is a 46 year old female diagnosed with right breast dcis in 2016 s/p lumpectomy. She was started on tamoxifen after that but developed vaginal bleeding and chest pain following that. CT chest was negative for PE. She was also seen by Gyn and there was no evidence of endometrial cancer. She has remained off tamoxifen since then. Family history of breast cancer in her mother. She did undergo genetic testing and was negative. She also has b/l breast implants  Interval history- she is doing well and denies any complaints. Notes occasional discomfort in her left breast which happened last month around her menstrual cycle. Also has a mole over the skin of left breast which she thinks is slowly growing and will be seeing dermatology soon   Review of systems- Review of Systems  Constitutional: Negative for chills, fever, malaise/fatigue and weight loss.  HENT: Negative for congestion, ear discharge and nosebleeds.   Eyes: Negative for blurred vision.  Respiratory: Negative for cough, hemoptysis, sputum production, shortness of breath and wheezing.   Cardiovascular: Negative for chest pain, palpitations, orthopnea and claudication.  Gastrointestinal: Negative for abdominal pain, blood in stool, constipation, diarrhea, heartburn, melena, nausea and vomiting.  Genitourinary: Negative for dysuria, flank pain, frequency, hematuria and urgency.  Musculoskeletal: Negative for back pain, joint pain and myalgias.  Skin: Negative for rash.  Neurological: Negative for dizziness, tingling, focal weakness, seizures, weakness  and headaches.  Endo/Heme/Allergies: Does not bruise/bleed easily.  Psychiatric/Behavioral: Negative for depression and suicidal ideas. The patient does not have insomnia.      Current treatment- observation  Not on File   Past Medical History:  Diagnosis Date  . Allergy    seasonal  . Breast cancer ([place]) right   radiation  . 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 Kidney stone   . Kidney stones   . PONV (postoperative nausea and vomiting)    nausea     Past Surgical History:  Procedure Laterality Date  . AUGMENTATION MAMMAPLASTY    . BREAST BIOPSY Right 2016   +  . BREAST CAPSULECTOMY WITH IMPLANT EXCHANGE Right 01/30/2015   Procedure: BREAST CAPSULECTOMY WITH IMPLANT EXCHANGE;  Surgeon: Bea Graff, MD;  Location: [place];  Service: Plastics;  Laterality: Right;  . BREAST ENHANCEMENT SURGERY Bilateral   . BREAST LUMPECTOMY WITH NEEDLE LOCALIZATION Right 01/30/2015   Procedure: BREAST LUMPECTOMY WITH NEEDLE LOCALIZATION;  Surgeon: Erroll Luna, MD;  Location: [place];  Service: General;  Laterality: Right;  . BREAST SURGERY     bilateral breast implants 2001  . FOOT SURGERY Left   . LAPAROSCOPIC APPENDECTOMY N/A 11/02/2016   Procedure: APPENDECTOMY LAPAROSCOPIC;  Surgeon: Dia Crawford III, MD;  Location: [facility];  Service: General;  Laterality: N/A;  . MOUTH SURGERY    . ORIF ANKLE FRACTURE     left  . Surgery on left lower leg     2007    Social History   Social History  . Marital status: Married    Spouse name: N/A  . Number of children: N/A  . Years of education: N/A   Occupational  History  . Not on file.   Social History Main Topics  . Smoking status: Never Smoker  . Smokeless tobacco: Never Used  . Alcohol use Yes     Comment: Drinks glass of wine 1-2 a month  . Drug use: No  . Sexual activity: Yes    Birth control/ protection: Other-see comments     Comment: Essure procedure done 1 year ago, menarche age 12, G60, P  11, maternal age 9-25   Other Topics Concern  . Not on file   Social History Narrative  . No narrative on file    Family History  Problem Relation Age of Onset  . Breast cancer Mother 16  . Heart disease Mother   . Hypertension Mother      Current Outpatient Prescriptions:  .  amoxicillin-clavulanate (AUGMENTIN) 875-125 MG tablet, Take 1 tablet by mouth 2 (two) times daily. (Patient not taking: Reported on 11/25/2016), Disp: 20 tablet, Rfl: 0 .  BIOTIN PO, Take 1 tablet by mouth. Reported on 04/22/2016, Disp: , Rfl:  .  Calcium Carbonate-Vitamin D (CALCIUM + D PO), Take by mouth once., Disp: , Rfl:  .  fluticasone (FLONASE) 50 MCG/ACT nasal spray, Place 2 sprays into both nostrils daily., Disp: 16 g, Rfl: 12 .  HYDROcodone-acetaminophen (NORCO/VICODIN) 5-325 MG tablet, Take 1-2 tablets by mouth every 4 (four) hours as needed for moderate pain. (Patient not taking: Reported on 11/25/2016), Disp: 30 tablet, Rfl: 0 .  ibuprofen (ADVIL,MOTRIN) 800 MG tablet, Take 1 tablet (800 mg total) by mouth every 6 (six) hours as needed for headache, moderate pain or cramping., Disp: 30 tablet, Rfl: 0 .  loratadine (CLARITIN) 10 MG tablet, Take 1 tablet (10 mg total) by mouth daily., Disp: 30 tablet, Rfl: 12 .  Multiple Vitamin (MULTIVITAMIN) tablet, Take 1 tablet by mouth daily., Disp: , Rfl:  .  naproxen sodium (ANAPROX) 220 MG tablet, Take 220 mg by mouth as needed., Disp: , Rfl:  .  ondansetron (ZOFRAN-ODT) 4 MG disintegrating tablet, DISSOLVE 1 TO 2 TABLETS BY MOUTH EVERY 8 HOURS AS NEEDED FOR NAUSEA, Disp: , Rfl: 0  Physical exam:  Vitals:   04/22/17 1101  BP: 119/76  Pulse: 82  Resp: 18  Temp: 98.1 F (36.7 C)  TempSrc: Tympanic  Weight: 125 lb 4.8 oz (56.8 kg)   Physical Exam  Constitutional: She is oriented to person, place, and time and well-developed, well-nourished, and in no distress.  HENT:  Head: Normocephalic and atraumatic.  Eyes: EOM are normal. Pupils are equal, round, and  reactive to light.  Neck: Normal range of motion.  Cardiovascular: Normal rate, regular rhythm and normal heart sounds.   Pulmonary/Chest: Effort normal and breath sounds normal.  Abdominal: Soft. Bowel sounds are normal.  Neurological: She is alert and oriented to person, place, and time.  Skin: Skin is warm and dry.   Breast exam was performed in seated and lying down position. Patient is status post right lumpectomy with a well-healed surgical scar. No evidence of any palpable masses. No evidence of axillary adenopathy. No evidence of any palpable masses or lumps in the left breast. No evidence of leftt axillary adenopathy. Bilateral breast implants in place  CMP Latest Ref Rng & Units 11/02/2016  Glucose 65 - 99 mg/dL 93  BUN 6 - 20 mg/dL 22(H)  Creatinine 0.44 - 1.00 mg/dL 0.95  Sodium 135 - 145 mmol/L 137  Potassium 3.5 - 5.1 mmol/L 3.7  Chloride 101 - 111 mmol/L 103  CO2  22 - 32 mmol/L 25  Calcium 8.9 - 10.3 mg/dL 9.2  Total Protein 6.5 - 8.1 g/dL 7.9  Total Bilirubin 0.3 - 1.2 mg/dL 1.0  Alkaline Phos 38 - 126 U/L 53  AST 15 - 41 U/L 21  ALT 14 - 54 U/L 19   CBC Latest Ref Rng & Units 11/02/2016  WBC 3.6 - 11.0 K/uL 15.0(H)  Hemoglobin 12.0 - 16.0 g/dL 12.0  Hematocrit 35.0 - 47.0 % 36.5  Platelets 150 - 440 K/uL 279     Assessment and plan- Patient is a 46 y.o. female with right breast DCIS in 2016 s/p lumpectomy. Currently off tamoxifen due to issues mentioned above  Recent mamogram from feb 2018 was negative for malignancy. Will schedule mammogram for feb 2019. Given that she had dense breasts on mamogram, will also schedule USG. I am not inclined to do MRI given no other breast cancer history other than her mother and negative genetic testing. Clinically she is doing well and there is no evidence of malignancy on todays exam. I will see her back in 1 year  There is no role for ai + OS in DCIS. There is no role for raloxifen either but I will look into this option for  chemoprevention and give her a call if that is an option   Visit Diagnosis 1. Ductal carcinoma in situ (DCIS) of right breast      Dr. Randa Evens, MD, MPH [facility] Pager- [phone] 04/22/2017 12:02 PM

## 2017-04-22 ENCOUNTER — Encounter: Payer: Self-pay | Admitting: Oncology

## 2017-04-22 ENCOUNTER — Inpatient Hospital Stay: Payer: BLUE CROSS/BLUE SHIELD

## 2017-04-22 ENCOUNTER — Inpatient Hospital Stay: Payer: BLUE CROSS/BLUE SHIELD | Attending: Oncology | Admitting: Oncology

## 2017-04-22 VITALS — BP 119/76 | HR 82 | Temp 98.1°F | Resp 18 | Wt 125.3 lb

## 2017-04-22 DIAGNOSIS — Z87442 Personal history of urinary calculi: Secondary | ICD-10-CM | POA: Insufficient documentation

## 2017-04-22 DIAGNOSIS — Z86000 Personal history of in-situ neoplasm of breast: Secondary | ICD-10-CM

## 2017-04-22 DIAGNOSIS — Z79899 Other long term (current) drug therapy: Secondary | ICD-10-CM | POA: Insufficient documentation

## 2017-04-22 DIAGNOSIS — N644 Mastodynia: Secondary | ICD-10-CM | POA: Diagnosis not present

## 2017-04-22 DIAGNOSIS — Z803 Family history of malignant neoplasm of breast: Secondary | ICD-10-CM | POA: Diagnosis not present

## 2017-04-22 DIAGNOSIS — D0511 Intraductal carcinoma in situ of right breast: Secondary | ICD-10-CM

## 2017-04-22 NOTE — Progress Notes (Signed)
Here for follow up feels good. Stated in Dec her "appendix ruptured "-had emergency surgery

## 2017-04-26 DIAGNOSIS — F439 Reaction to severe stress, unspecified: Secondary | ICD-10-CM | POA: Diagnosis not present

## 2017-05-10 DIAGNOSIS — F439 Reaction to severe stress, unspecified: Secondary | ICD-10-CM | POA: Diagnosis not present

## 2017-05-18 DIAGNOSIS — F439 Reaction to severe stress, unspecified: Secondary | ICD-10-CM | POA: Diagnosis not present

## 2017-06-02 DIAGNOSIS — F439 Reaction to severe stress, unspecified: Secondary | ICD-10-CM | POA: Diagnosis not present

## 2017-06-17 DIAGNOSIS — F439 Reaction to severe stress, unspecified: Secondary | ICD-10-CM | POA: Diagnosis not present

## 2017-06-29 DIAGNOSIS — F439 Reaction to severe stress, unspecified: Secondary | ICD-10-CM | POA: Diagnosis not present

## 2017-07-20 DIAGNOSIS — F439 Reaction to severe stress, unspecified: Secondary | ICD-10-CM | POA: Diagnosis not present

## 2017-07-28 DIAGNOSIS — F439 Reaction to severe stress, unspecified: Secondary | ICD-10-CM | POA: Diagnosis not present

## 2017-07-29 DIAGNOSIS — H16223 Keratoconjunctivitis sicca, not specified as Sjogren's, bilateral: Secondary | ICD-10-CM | POA: Diagnosis not present

## 2017-07-29 DIAGNOSIS — H04123 Dry eye syndrome of bilateral lacrimal glands: Secondary | ICD-10-CM | POA: Diagnosis not present

## 2017-07-29 DIAGNOSIS — H1045 Other chronic allergic conjunctivitis: Secondary | ICD-10-CM | POA: Diagnosis not present

## 2017-07-29 DIAGNOSIS — H5203 Hypermetropia, bilateral: Secondary | ICD-10-CM | POA: Diagnosis not present

## 2017-08-03 DIAGNOSIS — F439 Reaction to severe stress, unspecified: Secondary | ICD-10-CM | POA: Diagnosis not present

## 2017-08-16 DIAGNOSIS — F439 Reaction to severe stress, unspecified: Secondary | ICD-10-CM | POA: Diagnosis not present

## 2017-08-25 DIAGNOSIS — F439 Reaction to severe stress, unspecified: Secondary | ICD-10-CM | POA: Diagnosis not present

## 2017-09-08 DIAGNOSIS — F439 Reaction to severe stress, unspecified: Secondary | ICD-10-CM | POA: Diagnosis not present

## 2017-09-22 DIAGNOSIS — F439 Reaction to severe stress, unspecified: Secondary | ICD-10-CM | POA: Diagnosis not present

## 2017-09-28 DIAGNOSIS — T1512XA Foreign body in conjunctival sac, left eye, initial encounter: Secondary | ICD-10-CM | POA: Diagnosis not present

## 2017-10-06 DIAGNOSIS — J069 Acute upper respiratory infection, unspecified: Secondary | ICD-10-CM | POA: Diagnosis not present

## 2017-10-06 DIAGNOSIS — J029 Acute pharyngitis, unspecified: Secondary | ICD-10-CM | POA: Diagnosis not present

## 2017-10-08 DIAGNOSIS — F439 Reaction to severe stress, unspecified: Secondary | ICD-10-CM | POA: Diagnosis not present

## 2017-10-25 DIAGNOSIS — F439 Reaction to severe stress, unspecified: Secondary | ICD-10-CM | POA: Diagnosis not present

## 2017-11-10 DIAGNOSIS — F439 Reaction to severe stress, unspecified: Secondary | ICD-10-CM | POA: Diagnosis not present

## 2017-11-25 DIAGNOSIS — F439 Reaction to severe stress, unspecified: Secondary | ICD-10-CM | POA: Diagnosis not present

## 2017-12-01 ENCOUNTER — Other Ambulatory Visit: Payer: Self-pay

## 2017-12-01 ENCOUNTER — Encounter: Payer: Self-pay | Admitting: Radiation Oncology

## 2017-12-01 ENCOUNTER — Ambulatory Visit
Admission: RE | Admit: 2017-12-01 | Discharge: 2017-12-01 | Disposition: A | Discharge status: Home / Self Care | Payer: BLUE CROSS/BLUE SHIELD | Source: Ambulatory Visit | Attending: Radiation Oncology | Admitting: Radiation Oncology

## 2017-12-01 VITALS — BP 121/87 | HR 91 | Temp 97.8°F | Resp 20 | Wt 125.3 lb

## 2017-12-01 DIAGNOSIS — Z923 Personal history of irradiation: Secondary | ICD-10-CM | POA: Insufficient documentation

## 2017-12-01 DIAGNOSIS — D0511 Intraductal carcinoma in situ of right breast: Secondary | ICD-10-CM | POA: Diagnosis not present

## 2017-12-01 DIAGNOSIS — Z170 Estrogen receptor positive status [ER+]: Secondary | ICD-10-CM | POA: Diagnosis not present

## 2017-12-01 NOTE — Progress Notes (Signed)
Radiation Oncology Follow up Note  Name: Kaylee Benson   Date:   12/01/2017 MRN:  [id number] DOB: 1971/11/10    This 47 y.o. female presents to the clinic today for 2 half year follow-up status post whole breast radiation to her right breast for ER/PR positive ductal carcinoma in situ.  REFERRING PROVIDER: Jerrol Banana.,*  HPI: Patient is a 47 year old female now seen out 2 and half years having completed whole breast radiation to her right breast for ER/PR positive ductal carcinoma in situ. She is seen today in routine follow-up and is doing well. She does have bilateral breast implants. Breast implants are causing no pain or contraction. She was on antiestrogen therapy although caused significant chest pain that has been discontinued. Her last mammogram. Was back in February and was BI-RADS 2 benign. She scheduled for follow-up mammogram in February.  COMPLICATIONS OF TREATMENT: none  FOLLOW UP COMPLIANCE: keeps appointments   PHYSICAL EXAM:  BP 121/87   Pulse 91   Temp 97.8 F (36.6 C)   Resp 20   Wt 125 lb 5.3 oz (56.8 kg)   BMI 23.68 kg/m  Lungs are clear to A&P cardiac examination essentially unremarkable with regular rate and rhythm. No dominant mass or nodularity is noted in either breast in 2 positions examined. Incision is well-healed. No axillary or supraclavicular adenopathy is appreciated. Cosmetic result is excellent. Patient does have bilateral breast implants no evidence of contraction is noted. Well-developed well-nourished patient in NAD. HEENT reveals PERLA, EOMI, discs not visualized.  Oral cavity is clear. No oral mucosal lesions are identified. Neck is clear without evidence of cervical or supraclavicular adenopathy. Lungs are clear to A&P. Cardiac examination is essentially unremarkable with regular rate and rhythm without murmur rub or thrill. Abdomen is benign with no organomegaly or masses noted. Motor sensory and DTR levels are equal and symmetric in the  upper and lower extremities. Cranial nerves II through XII are grossly intact. Proprioception is intact. No peripheral adenopathy or edema is identified. No motor or sensory levels are noted. Crude visual fields are within normal range.  RADIOLOGY RESULTS: Previous mammograms are reviewed and compatible with the above-stated findings showing BI-RADS 2 benign disease  PLAN: Present time patient is doing well. I'm please were overall progress. She is off antiestrogen therapy based on side effect profile. She's or scheduled for mammograms in February. I have asked to see her back in 1 year for follow-up. Patient is to call at anytime with any concerns.  I would like to take this opportunity to thank you for allowing me to participate in the care of your patient.Noreene Filbert, MD

## 2017-12-07 DIAGNOSIS — F439 Reaction to severe stress, unspecified: Secondary | ICD-10-CM | POA: Diagnosis not present

## 2017-12-21 DIAGNOSIS — F439 Reaction to severe stress, unspecified: Secondary | ICD-10-CM | POA: Diagnosis not present

## 2017-12-22 ENCOUNTER — Telehealth: Payer: Self-pay | Admitting: Oncology

## 2017-12-22 DIAGNOSIS — Z6824 Body mass index (BMI) 24.0-24.9, adult: Secondary | ICD-10-CM | POA: Diagnosis not present

## 2017-12-22 DIAGNOSIS — Z01419 Encounter for gynecological examination (general) (routine) without abnormal findings: Secondary | ICD-10-CM | POA: Diagnosis not present

## 2017-12-22 NOTE — Telephone Encounter (Signed)
MD appt rschd per MD on PAL.  Appt rschd and conf with patient. Date/time,per patient request.

## 2017-12-28 DIAGNOSIS — M545 Low back pain: Secondary | ICD-10-CM | POA: Diagnosis not present

## 2017-12-28 DIAGNOSIS — N939 Abnormal uterine and vaginal bleeding, unspecified: Secondary | ICD-10-CM | POA: Diagnosis not present

## 2017-12-28 DIAGNOSIS — N924 Excessive bleeding in the premenopausal period: Secondary | ICD-10-CM | POA: Diagnosis not present

## 2018-01-06 DIAGNOSIS — N920 Excessive and frequent menstruation with regular cycle: Secondary | ICD-10-CM | POA: Diagnosis not present

## 2018-01-10 ENCOUNTER — Ambulatory Visit
Admission: RE | Admit: 2018-01-10 | Discharge: 2018-01-10 | Disposition: A | Discharge status: Home / Self Care | Payer: BLUE CROSS/BLUE SHIELD | Source: Ambulatory Visit | Attending: Oncology | Admitting: Oncology

## 2018-01-10 DIAGNOSIS — D0511 Intraductal carcinoma in situ of right breast: Secondary | ICD-10-CM

## 2018-01-10 DIAGNOSIS — R928 Other abnormal and inconclusive findings on diagnostic imaging of breast: Secondary | ICD-10-CM | POA: Diagnosis not present

## 2018-01-13 DIAGNOSIS — Z853 Personal history of malignant neoplasm of breast: Secondary | ICD-10-CM | POA: Diagnosis not present

## 2018-01-13 DIAGNOSIS — Z801 Family history of malignant neoplasm of trachea, bronchus and lung: Secondary | ICD-10-CM | POA: Diagnosis not present

## 2018-01-13 DIAGNOSIS — Z8051 Family history of malignant neoplasm of kidney: Secondary | ICD-10-CM | POA: Diagnosis not present

## 2018-01-13 DIAGNOSIS — Z803 Family history of malignant neoplasm of breast: Secondary | ICD-10-CM | POA: Diagnosis not present

## 2018-01-13 DIAGNOSIS — D649 Anemia, unspecified: Secondary | ICD-10-CM | POA: Diagnosis not present

## 2018-02-08 ENCOUNTER — Ambulatory Visit (INDEPENDENT_AMBULATORY_CARE_PROVIDER_SITE_OTHER): Payer: BLUE CROSS/BLUE SHIELD | Admitting: Family Medicine

## 2018-02-08 ENCOUNTER — Encounter: Payer: Self-pay | Admitting: Family Medicine

## 2018-02-08 VITALS — BP 112/70 | HR 70 | Temp 98.8°F | Resp 16 | Ht 60.0 in

## 2018-02-08 DIAGNOSIS — Z114 Encounter for screening for human immunodeficiency virus [HIV]: Secondary | ICD-10-CM

## 2018-02-08 DIAGNOSIS — Z131 Encounter for screening for diabetes mellitus: Secondary | ICD-10-CM

## 2018-02-08 DIAGNOSIS — Z0000 Encounter for general adult medical examination without abnormal findings: Secondary | ICD-10-CM | POA: Diagnosis not present

## 2018-02-08 DIAGNOSIS — Z13220 Encounter for screening for lipoid disorders: Secondary | ICD-10-CM | POA: Diagnosis not present

## 2018-02-08 DIAGNOSIS — Z1329 Encounter for screening for other suspected endocrine disorder: Secondary | ICD-10-CM

## 2018-02-08 DIAGNOSIS — Z136 Encounter for screening for cardiovascular disorders: Secondary | ICD-10-CM

## 2018-02-08 NOTE — Progress Notes (Addendum)
Patient: Kaylee Benson, Female    DOB: 1971/04/15, 47 y.o.   MRN: [id number] Visit Date: 02/08/2018  Today's Provider: Wilhemena Durie, MD   Chief Complaint  Patient presents with  . Annual Exam   Subjective:    Annual physical exam Kaylee Benson is a 47 y.o. female who presents today for health maintenance and complete physical. She feels well. She reports exercising, very active- runs and walk. She reports she is sleeping well.  Last CPE:10/08/16 Mammogram: 01/10/18 BI-RADS 2: Benign Pap:She is followed by [facility] in [place]. She just had a DNC done 01/06/18. She follow-up on 01/13/18 she had anemia and was placed on iron supplement. Tdap/Td: 6 yrs ago while pregnant at [facility] -----------------------------------------------------------------   Review of Systems  Constitutional: Negative.   HENT: Positive for congestion.   Eyes: Negative.   Respiratory: Negative.   Cardiovascular: Positive for leg swelling.  Gastrointestinal: Negative.   Endocrine: Negative.   Genitourinary: Negative.   Musculoskeletal: Positive for back pain.  Skin: Negative.   Allergic/Immunologic: Positive for environmental allergies.  Neurological: Positive for dizziness.  Hematological: Bruises/bleeds easily.  Psychiatric/Behavioral: Negative.     Social History      She  reports that  has never smoked. she has never used smokeless tobacco. She reports that she drinks alcohol. She reports that she does not use drugs.       Social History   Socioeconomic History  . Marital status: Married    Spouse name: None  . Number of children: None  . Years of education: None  . Highest education level: None  Social Needs  . Financial resource strain: None  . Food insecurity - worry: None  . Food insecurity - inability: None  . Transportation needs - medical: None  . Transportation needs - non-medical: None  Occupational History  . None  Tobacco Use  .  Smoking status: Never Smoker  . Smokeless tobacco: Never Used  Substance and Sexual Activity  . Alcohol use: Yes    Comment: Drinks glass of wine 1-2 a month  . Drug use: No  . Sexual activity: Yes    Birth control/protection: Other-see comments    Comment: Essure procedure done 1 year ago, menarche age 23, G41, P 35, maternal age 23-25  Other Topics Concern  . None  Social History Narrative  . None    Past Medical History:  Diagnosis Date  . Allergy    seasonal  . Breast cancer ([place]) right   radiation  . 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 Kidney stone   . Kidney stones   . PONV (postoperative nausea and vomiting)    nausea     Patient Active Problem List   Diagnosis Date Noted  . Status post laparoscopic appendectomy 11/09/2016  . Abnormal finding on mammography 09/04/2015  . Personal history of urinary calculi 09/04/2015  . Depression 09/04/2015  . 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 Allergic rhinitis 02/16/2008    Past Surgical History:  Procedure Laterality Date  . AUGMENTATION MAMMAPLASTY    . BREAST BIOPSY Right 2016   LOW GRADE DUCTAL CARCINOMA IN SITU, 0.45 CM IN GREATEST DIMENSION  . BREAST CAPSULECTOMY WITH IMPLANT EXCHANGE Right 01/30/2015   Procedure: BREAST CAPSULECTOMY WITH IMPLANT EXCHANGE;  Surgeon: Bea Graff, MD;  Location: [facility];  Service: Plastics;  Laterality: Right;  . BREAST ENHANCEMENT SURGERY Bilateral   . BREAST LUMPECTOMY  Right 2016   LOW GRADE DUCTAL CARCINOMA IN SITU, 0.45 CM IN GREATEST DIMENSION  . BREAST LUMPECTOMY WITH NEEDLE LOCALIZATION Right 01/30/2015   Procedure: BREAST LUMPECTOMY WITH NEEDLE LOCALIZATION;  Surgeon: Erroll Luna, MD;  Location: [facility];  Service: General;  Laterality: Right;  . BREAST SURGERY     bilateral breast implants 2001  . FOOT SURGERY Left   . LAPAROSCOPIC APPENDECTOMY N/A 11/02/2016   Procedure: APPENDECTOMY LAPAROSCOPIC;  Surgeon: Dia Crawford III, MD;   Location: [facility];  Service: General;  Laterality: N/A;  . MOUTH SURGERY    . ORIF ANKLE FRACTURE     left  . Surgery on left lower leg     2007    Family History        Family Status  Relation Name Status  . Mother  (Not Specified)        Her family history includes Breast cancer (age of onset: 76) in her mother; Heart disease in her mother; Hypertension in her mother.      No Known Allergies   Current Outpatient Medications:  .  BIOTIN PO, Take 1 tablet by mouth. Reported on 04/22/2016, Disp: , Rfl:  .  Calcium Carbonate-Vitamin D (CALCIUM + D PO), Take by mouth once., Disp: , Rfl:  .  Ferrous Sulfate 90 (18 Fe) MG TABS, Take by mouth., Disp: , Rfl:  .  fluticasone (FLONASE) 50 MCG/ACT nasal spray, Place 2 sprays into both nostrils daily., Disp: 16 g, Rfl: 12 .  Multiple Vitamin (MULTIVITAMIN) tablet, Take 1 tablet by mouth daily., Disp: , Rfl:  .  naproxen sodium (ANAPROX) 220 MG tablet, Take 220 mg by mouth as needed., Disp: , Rfl:    Patient Care Team: Jerrol Banana., MD as PCP - General (Family Medicine)      Objective:   Vitals: BP 112/70 (BP Location: Right Arm, Patient Position: Sitting, Cuff Size: Normal)   Pulse 70   Temp 98.8 F (37.1 C) (Oral)   Resp 16   Ht 5' (1.524 m)   LMP 01/29/2018   BMI 24.48 kg/m    Physical Exam  Constitutional: She is oriented to person, place, and time. She appears well-developed and well-nourished.  HENT:  Head: Normocephalic and atraumatic.  Right Ear: External ear normal.  Left Ear: External ear normal.  Nose: Nose normal.  Mouth/Throat: Oropharynx is clear and moist.  Eyes: Conjunctivae and EOM are normal. Pupils are equal, round, and reactive to light.  Neck: Normal range of motion. Neck supple.  Cardiovascular: Normal rate, regular rhythm, normal heart sounds and intact distal pulses.  Pulmonary/Chest: Effort normal and breath sounds normal.  Abdominal: Soft. Bowel sounds are normal.  Musculoskeletal:  Normal range of motion.  Neurological: She is alert and oriented to person, place, and time. She has normal reflexes.  Skin: Skin is warm and dry.  Psychiatric: She has a normal mood and affect. Her behavior is normal. Judgment and thought content normal.     Depression Screen PHQ 2/9 Scores 12/01/2017 10/08/2016 11/27/2015 09/04/2015  PHQ - 2 Score 0 0 0 0      Assessment & Plan:     Routine Health Maintenance and Physical Exam  Exercise Activities and Dietary recommendations Goals    None       There is no immunization history on file for this patient.  Health Maintenance  Topic Date Due  . HIV Screening  10/07/1986  . TETANUS/TDAP  10/07/1990  . PAP SMEAR  10/07/1992  . INFLUENZA VACCINE  06/23/2017    Breast/pelvic/DRE per gynecology. Discussed health benefits of physical activity, and encouraged her to engage in regular exercise appropriate for her age and condition.    1. Annual physical exam  - CBC with Differential/Platelet - Comprehensive metabolic panel - Hemoglobin A1c  2. Encounter for lipid screening for cardiovascular disease  - Lipid panel  3. Encounter for screening for diabetes mellitus  - Hemoglobin A1c  4. Screening for thyroid disorder  - TSH  5. Screening for HIV (human immunodeficiency virus)  - HIV antibody 6.Mourning Stepson just commited suicide 1 week ago.  I have done the exam and reviewed the above chart and it is accurate to the best of my knowledge. Development worker, community has been used in this note in any air is in the dictation or transcription are unintentional.  Wilhemena Durie, MD  [place]

## 2018-02-09 DIAGNOSIS — Z136 Encounter for screening for cardiovascular disorders: Secondary | ICD-10-CM | POA: Diagnosis not present

## 2018-02-09 DIAGNOSIS — Z13220 Encounter for screening for lipoid disorders: Secondary | ICD-10-CM | POA: Diagnosis not present

## 2018-02-09 DIAGNOSIS — Z131 Encounter for screening for diabetes mellitus: Secondary | ICD-10-CM | POA: Diagnosis not present

## 2018-02-09 DIAGNOSIS — Z0000 Encounter for general adult medical examination without abnormal findings: Secondary | ICD-10-CM | POA: Diagnosis not present

## 2018-02-10 ENCOUNTER — Telehealth: Payer: Self-pay | Admitting: Family Medicine

## 2018-02-10 ENCOUNTER — Telehealth: Payer: Self-pay

## 2018-02-10 LAB — COMPREHENSIVE METABOLIC PANEL
ALBUMIN: 4 g/dL (ref 3.5–5.5)
ALT: 13 IU/L (ref 0–32)
AST: 19 IU/L (ref 0–40)
Albumin/Globulin Ratio: 1.7 (ref 1.2–2.2)
Alkaline Phosphatase: 48 IU/L (ref 39–117)
BILIRUBIN TOTAL: 0.2 mg/dL (ref 0.0–1.2)
BUN / CREAT RATIO: 21 (ref 9–23)
BUN: 17 mg/dL (ref 6–24)
CALCIUM: 9.2 mg/dL (ref 8.7–10.2)
CHLORIDE: 101 mmol/L (ref 96–106)
CO2: 24 mmol/L (ref 20–29)
Creatinine, Ser: 0.81 mg/dL (ref 0.57–1.00)
GFR calc non Af Amer: 87 mL/min/{1.73_m2} (ref >59)
GFR, EST AFRICAN AMERICAN: 101 mL/min/{1.73_m2} (ref >59)
GLUCOSE: 82 mg/dL (ref 65–99)
Globulin, Total: 2.4 g/dL (ref 1.5–4.5)
Potassium: 4.1 mmol/L (ref 3.5–5.2)
Sodium: 139 mmol/L (ref 134–144)
TOTAL PROTEIN: 6.4 g/dL (ref 6.0–8.5)

## 2018-02-10 LAB — CBC WITH DIFFERENTIAL/PLATELET
BASOS ABS: 0 10*3/uL (ref 0.0–0.2)
BASOS: 1 %
EOS (ABSOLUTE): 0.1 10*3/uL (ref 0.0–0.4)
Eos: 3 %
HEMOGLOBIN: 11.4 g/dL (ref 11.1–15.9)
Hematocrit: 36.3 % (ref 34.0–46.6)
IMMATURE GRANS (ABS): 0 10*3/uL (ref 0.0–0.1)
IMMATURE GRANULOCYTES: 0 %
LYMPHS: 18 %
Lymphocytes Absolute: 0.8 10*3/uL (ref 0.7–3.1)
MCH: 26.6 pg (ref 26.6–33.0)
MCHC: 31.4 g/dL — ABNORMAL LOW (ref 31.5–35.7)
MCV: 85 fL (ref 79–97)
MONOCYTES: 10 %
Monocytes Absolute: 0.4 10*3/uL (ref 0.1–0.9)
NEUTROS ABS: 3 10*3/uL (ref 1.4–7.0)
NEUTROS PCT: 68 %
Platelets: 306 10*3/uL (ref 150–379)
RBC: 4.28 x10E6/uL (ref 3.77–5.28)
RDW: 20.5 % — ABNORMAL HIGH (ref 12.3–15.4)
WBC: 4.3 10*3/uL (ref 3.4–10.8)

## 2018-02-10 LAB — HEMOGLOBIN A1C
Est. average glucose Bld gHb Est-mCnc: 108 mg/dL
Hgb A1c MFr Bld: 5.4 % (ref 4.8–5.6)

## 2018-02-10 LAB — HIV ANTIBODY (ROUTINE TESTING W REFLEX): HIV Screen 4th Generation wRfx: NONREACTIVE

## 2018-02-10 LAB — LIPID PANEL
CHOL/HDL RATIO: 2.7 ratio (ref 0.0–4.4)
CHOLESTEROL TOTAL: 207 mg/dL — AB (ref 100–199)
HDL: 78 mg/dL (ref >39)
LDL CALC: 116 mg/dL — AB (ref 0–99)
TRIGLYCERIDES: 64 mg/dL (ref 0–149)
VLDL CHOLESTEROL CAL: 13 mg/dL (ref 5–40)

## 2018-02-10 LAB — TSH: TSH: 1.03 u[IU]/mL (ref 0.450–4.500)

## 2018-02-10 NOTE — Telephone Encounter (Signed)
Pt returned call

## 2018-02-10 NOTE — Telephone Encounter (Signed)
[id number] 

## 2018-02-10 NOTE — Telephone Encounter (Signed)
Advised 

## 2018-02-10 NOTE — Telephone Encounter (Signed)
-----   Message from Jerrol Banana., MD sent at 02/10/2018 10:31 AM EDT ----- Stable.

## 2018-03-09 DIAGNOSIS — D649 Anemia, unspecified: Secondary | ICD-10-CM | POA: Diagnosis not present

## 2018-03-25 ENCOUNTER — Encounter: Payer: Self-pay | Admitting: Physician Assistant

## 2018-03-25 ENCOUNTER — Ambulatory Visit
Admission: RE | Admit: 2018-03-25 | Discharge: 2018-03-25 | Disposition: A | Discharge status: Home / Self Care | Payer: BLUE CROSS/BLUE SHIELD | Source: Ambulatory Visit | Attending: Physician Assistant | Admitting: Physician Assistant

## 2018-03-25 ENCOUNTER — Ambulatory Visit: Payer: BLUE CROSS/BLUE SHIELD | Admitting: Physician Assistant

## 2018-03-25 ENCOUNTER — Telehealth: Payer: Self-pay | Admitting: Family Medicine

## 2018-03-25 VITALS — BP 110/80 | HR 86 | Temp 98.2°F | Resp 16 | Wt 128.0 lb

## 2018-03-25 DIAGNOSIS — G8929 Other chronic pain: Secondary | ICD-10-CM

## 2018-03-25 DIAGNOSIS — M533 Sacrococcygeal disorders, not elsewhere classified: Secondary | ICD-10-CM | POA: Insufficient documentation

## 2018-03-25 DIAGNOSIS — M47818 Spondylosis without myelopathy or radiculopathy, sacral and sacrococcygeal region: Secondary | ICD-10-CM

## 2018-03-25 DIAGNOSIS — M62838 Other muscle spasm: Secondary | ICD-10-CM

## 2018-03-25 MED ORDER — CYCLOBENZAPRINE HCL 5 MG PO TABS: 5.0000 mg | ORAL_TABLET | Freq: Three times a day (TID) | ORAL | 1 refills | Status: DC | PRN

## 2018-03-25 NOTE — Telephone Encounter (Signed)
Was just in seeing Kaylee Benson.  She is waiting to get an xray right now but is wanting to know if she should get a muscle relaxer from you to help with the spasm.  Simona Huh has gave her one in the past  If so she uses [facility] [place]  Her call back is [phone]

## 2018-03-25 NOTE — Telephone Encounter (Signed)
Sent in for her

## 2018-03-25 NOTE — Patient Instructions (Signed)
Sacroiliac Joint Dysfunction Sacroiliac joint dysfunction is a condition that causes inflammation on one or both sides of the sacroiliac (SI) joint. The SI joint connects the lower part of the spine (sacrum) with the two upper portions of the pelvis (ilium). This condition causes deep aching or burning pain in the low back. In some cases, the pain may also spread into one or both buttocks or hips or spread down the legs. What are the causes? This condition may be caused by:  Pregnancy. During pregnancy, extra stress is put on the SI joints because the pelvis widens.  Injury, such as: ? Car accidents. ? Sport-related injuries. ? Work-related injuries.  Having one leg that is shorter than the other.  Conditions that affect the joints, such as: ? Rheumatoid arthritis. ? Gout. ? Psoriatic arthritis. ? Joint infection (septic arthritis).  Sometimes, the cause of SI joint dysfunction is not known. What are the signs or symptoms? Symptoms of this condition include:  Aching or burning pain in the lower back. The pain may also spread to other areas, such as: ? Buttocks. ? Groin. ? Thighs and legs.  Muscle spasms in or around the painful areas.  Increased pain when standing, walking, running, stair climbing, bending, or lifting.  How is this diagnosed? Your health care provider will do a physical exam and take your medical history. During the exam, the health care provider may move one or both of your legs to different positions to check for pain. Various tests may be done to help verify the diagnosis, including:  Imaging tests to look for other causes of pain. These may include: ? MRI. ? CT scan. ? Bone scan.  Diagnostic injection. A numbing medicine is injected into the SI joint using a needle. If the pain is temporarily improved or stopped after the injection, this can indicate that SI joint dysfunction is the problem.  How is this treated? Treatment may vary depending on the  cause and severity of your condition. Treatment options may include:  Applying ice or heat to the lower back area. This can help to reduce pain and muscle spasms.  Medicines to relieve pain or inflammation or to relax the muscles.  Wearing a back brace (sacroiliac brace) to help support the joint while your back is healing.  Physical therapy to increase muscle strength around the joint and flexibility at the joint. This may also involve learning proper body positions and ways of moving to relieve stress on the joint.  Direct manipulation of the SI joint.  Injections of steroid medicine into the joint in order to reduce pain and swelling.  Radiofrequency ablation to burn away nerves that are carrying pain messages from the joint.  Use of a device that provides electrical stimulation in order to reduce pain at the joint.  Surgery to put in screws and plates that limit or prevent joint motion. This is rare.  Follow these instructions at home:  Rest as needed. Limit your activities as directed by your health care provider.  Take medicines only as directed by your health care provider.  If directed, apply ice to the affected area: ? Put ice in a plastic bag. ? Place a towel between your skin and the bag. ? Leave the ice on for 20 minutes, 2-3 times per day.  Use a heating pad or a moist heat pack as directed by your health care provider.  Exercise as directed by your health care provider or physical therapist.  Keep all follow-up visits   as directed by your health care provider. This is important. Contact a health care provider if:  Your pain is not controlled with medicine.  You have a fever.  You have increasingly severe pain. Get help right away if:  You have weakness, numbness, or tingling in your legs or feet.  You lose control of your bladder or bowel. This information is not intended to replace advice given to you by your health care provider. Make sure you discuss  any questions you have with your health care provider. Document Released: 02/05/2009 Document Revised: 04/16/2016 Document Reviewed: 07/17/2014 Elsevier Interactive Patient Education  2018 Elsevier Inc.  

## 2018-03-25 NOTE — Progress Notes (Signed)
Patient: Kaylee Benson Female    DOB: Dec 08, 1970   47 y.o.   MRN: [id number] Visit Date: 03/25/2018  Today's Provider: Mar Daring, PA-C   Chief Complaint  Patient presents with  . Back Pain   Subjective:    HPI Patient here today C/O back pain recurrent back pain x's 3 years. Patient reports this episode started on Saturday. Patient reports she "turned" and that's when pain started. Patient reports pain is more like "stabbing" pain was on right side today pain is in the low-middle of her back close to her tailbone. Patient reports she has been taking Aleve and reports mild pain improvement. Patient is concerned that back pain is related to some surgery or procedures done in the past. Appendectomy, D & C and Essure procedure. Patient had a fall in 2016 and has had intermittent flares of back pain since that time. She has seen a chiropractor in the past for her back pain as well.     No Known Allergies   Current Outpatient Medications:  .  BIOTIN PO, Take 1 tablet by mouth. Reported on 04/22/2016, Disp: , Rfl:  .  Calcium Carbonate-Vitamin D (CALCIUM + D PO), Take by mouth once., Disp: , Rfl:  .  Ferrous Sulfate 90 (18 Fe) MG TABS, Take by mouth., Disp: , Rfl:  .  fluticasone (FLONASE) 50 MCG/ACT nasal spray, Place 2 sprays into both nostrils daily., Disp: 16 g, Rfl: 12 .  Multiple Vitamin (MULTIVITAMIN) tablet, Take 1 tablet by mouth daily., Disp: , Rfl:  .  naproxen sodium (ANAPROX) 220 MG tablet, Take 220 mg by mouth as needed., Disp: , Rfl:   Review of Systems  Constitutional: Negative.   Respiratory: Negative.   Cardiovascular: Negative.   Genitourinary: Negative.   Musculoskeletal: Positive for back pain.  Neurological: Negative for weakness and numbness.    Social History   Tobacco Use  . Smoking status: Never Smoker  . Smokeless tobacco: Never Used  Substance Use Topics  . Alcohol use: Yes    Comment: Drinks glass of wine 1-2 a month   Objective:   BP 110/80 (BP Location: Left Arm, Patient Position: Sitting, Cuff Size: Normal)   Pulse 86   Temp 98.2 F (36.8 C) (Oral)   Resp 16   Wt 128 lb (58.1 kg)   SpO2 97%   BMI 25.00 kg/m  Vitals:   03/25/18 1448  BP: 110/80  Pulse: 86  Resp: 16  Temp: 98.2 F (36.8 C)  TempSrc: Oral  SpO2: 97%  Weight: 128 lb (58.1 kg)     Physical Exam  Constitutional: She appears well-developed and well-nourished. No distress.  Neck: Normal range of motion. Neck supple.  Cardiovascular: Normal rate, regular rhythm and normal heart sounds. Exam reveals no gallop and no friction rub.  No murmur heard. Pulmonary/Chest: Effort normal and breath sounds normal. No respiratory distress. She has no wheezes. She has no rales.  Musculoskeletal:       Lumbar back: She exhibits bony tenderness. She exhibits normal range of motion, no tenderness and no spasm.       Back:  Skin: She is not diaphoretic.  Vitals reviewed.      CLINICAL DATA:  Diffuse abdominal pain, RIGHT lower quadrant pain today. Nausea and vomiting beginning yesterday. History of breast cancer.  EXAM: CT ABDOMEN AND PELVIS WITH CONTRAST  TECHNIQUE: Multidetector CT imaging of the abdomen and pelvis was performed using the standard protocol following bolus  administration of intravenous contrast.  CONTRAST:  23mL ISOVUE-300 IOPAMIDOL (ISOVUE-300) INJECTION 61%  COMPARISON:  None.  FINDINGS: LOWER CHEST: Lung bases are clear. Included heart size is normal. No pericardial effusion.  HEPATOBILIARY: Liver and gallbladder are normal.  PANCREAS: Normal.  SPLEEN: Normal.  ADRENALS/URINARY TRACT: Kidneys are orthotopic, demonstrating symmetric enhancement. No nephrolithiasis, hydronephrosis or solid renal masses. The unopacified ureters are normal in course and caliber. Delayed imaging through the kidneys demonstrates symmetric prompt contrast excretion within the proximal urinary collecting system. Urinary bladder  is partially distended and unremarkable. Normal adrenal glands.  STOMACH/BOWEL: The stomach, small and large bowel are normal in course and caliber without inflammatory changes. The appendix is enlarged, 16 mm with wall thickening and periappendiceal inflammation. Multiple subcentimeter appendicolith.  VASCULAR/LYMPHATIC: Aortoiliac vessels are normal in course and caliber. Prominent pelvic veins and LEFT gonadal vein. No lymphadenopathy by CT size criteria.  REPRODUCTIVE: Normal. Thin walled enhancing 2.3 cm RIGHT corpus luteal cyst. Bilateral Essure devices.  OTHER: Small amount of free fluid in RIGHT pelvis.  MUSCULOSKELETAL: Nonacute.  Mild RIGHT sacroiliac osteoarthrosis.  IMPRESSION: Acute appendicitis without complication.  Electronically Signed: By: Elon Alas M.D. On: 11/02/2016 18:06  Assessment & Plan:     1. Chronic right SI joint pain Suspect SI joint arthrosis/itis. Will obtain specific imaging for the SI joints. I will f/u pending results. PT referral placed as I feel this will benefit her greatly. She is going to consider restarting chiropractic care as well. Discussed tumeric for inflammation. Moist heat prn and ice after activity. Epsom salt soaks. Call if symptoms worsen.  - DG Si Joints; Future - Ambulatory referral to Physical Therapy  2. SI joint arthritis See above medical treatment plan. - DG Si Joints; Future - Ambulatory referral to Physical Therapy       Mar Daring, PA-C  [facility]

## 2018-03-25 NOTE — Telephone Encounter (Signed)
Patient advised as below.  

## 2018-04-12 ENCOUNTER — Ambulatory Visit: Payer: BLUE CROSS/BLUE SHIELD | Attending: Physician Assistant | Admitting: Physical Therapy

## 2018-04-12 ENCOUNTER — Encounter: Payer: Self-pay | Admitting: Physical Therapy

## 2018-04-12 DIAGNOSIS — M533 Sacrococcygeal disorders, not elsewhere classified: Secondary | ICD-10-CM

## 2018-04-12 NOTE — Therapy (Signed)
[facility] [address], [place], [address] Phone: [phone]   Fax:  [phone]  Physical Therapy Evaluation  Patient Details  Name: Kaylee Benson MRN: [id number] Date of Birth: 05-02-1971 No data recorded  Encounter Date: 04/12/2018    Past Medical History:  Diagnosis Date  . Allergy    seasonal  . Breast cancer ([place]) right   radiation  . 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 Kidney stone   . Kidney stones   . PONV (postoperative nausea and vomiting)    nausea    Past Surgical History:  Procedure Laterality Date  . AUGMENTATION MAMMAPLASTY    . BREAST BIOPSY Right 2016   LOW GRADE DUCTAL CARCINOMA IN SITU, 0.45 CM IN GREATEST DIMENSION  . BREAST CAPSULECTOMY WITH IMPLANT EXCHANGE Right 01/30/2015   Procedure: BREAST CAPSULECTOMY WITH IMPLANT EXCHANGE;  Surgeon: Bea Graff, MD;  Location: [facility];  Service: Plastics;  Laterality: Right;  . BREAST ENHANCEMENT SURGERY Bilateral   . BREAST LUMPECTOMY Right 2016   LOW GRADE DUCTAL CARCINOMA IN SITU, 0.45 CM IN GREATEST DIMENSION  . BREAST LUMPECTOMY WITH NEEDLE LOCALIZATION Right 01/30/2015   Procedure: BREAST LUMPECTOMY WITH NEEDLE LOCALIZATION;  Surgeon: Erroll Luna, MD;  Location: [facility];  Service: General;  Laterality: Right;  . BREAST SURGERY     bilateral breast implants 2001  . FOOT SURGERY Left   . LAPAROSCOPIC APPENDECTOMY N/A 11/02/2016   Procedure: APPENDECTOMY LAPAROSCOPIC;  Surgeon: Dia Crawford III, MD;  Location: [facility];  Service: General;  Laterality: N/A;  . MOUTH SURGERY    . ORIF ANKLE FRACTURE     left  . Surgery on left lower leg     2007    There were no vitals filed for this visit.     ROM All hip motions wnl with slight soft tissue resistance with passive IR Lumbar flexion wnl; re-tested with soley hip hinge and limited w/ "pulling" at bilat distal hamstrings Lumbar rotation  wnl actively and with overpressure Lumbar lateral bending wnl with concordant sign at end range on L with L side bending and at end range on R SI joint with R side bending Lumbar ext wnl   Strength Hip flex: 5/5 bilat Hip abd L:4-/5 w/ pain R 4/5 Hip Ext in prone: 4+/5 bilat Hip IR: 5/5 bilat Hip ER: 4/5 bilat     Special Tests/ Other Reflexes Patellar: 2+ L 1+ R No pain response with lumbar CPA/UPAs TTP at bilat SIJ R>L (+) Elys on L for hip flexor tightness (+) 90/90 bilat (-) FABER bilat (+) Obers bilat (-) Scour bilat [place] cluster:  (-) thigh thrust test (-) distraction test (-) Sacral compression (prone) (-) Sacral compression (bilat sidlying)    Ther-Ex -Modified Childs pose stretch 4x  30sec holds (HEP) - Seated piriformis stretch 2x 30sec holds (HEP) - Bridge exercise induced pain so held off for now [employer] on PT role and there-ex role on pain and strengthening muscles surrounding the spine to increase stability and decrease pain     .               Objective measurements completed on examination: See above findings.                             Patient will benefit from skilled therapeutic intervention in order to improve the following deficits  and impairments:     Visit Diagnosis: No diagnosis found.     Problem List Patient Active Problem List   Diagnosis Date Noted  . Status post laparoscopic appendectomy 11/09/2016  . Abnormal finding on mammography 09/04/2015  . Personal history of urinary calculi 09/04/2015  . Depression 09/04/2015  . 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 Allergic rhinitis 02/16/2008    Kaylee Benson 04/12/2018, 3:59 PM  [facility] [address], [place], [address] Phone: [phone]   Fax:  [phone]  Name: Kaylee Benson MRN: [id number] Date of Birth: 11-04-1971

## 2018-04-14 ENCOUNTER — Ambulatory Visit: Payer: BLUE CROSS/BLUE SHIELD | Admitting: Physical Therapy

## 2018-04-20 ENCOUNTER — Encounter: Payer: BLUE CROSS/BLUE SHIELD | Admitting: Physical Therapy

## 2018-04-21 ENCOUNTER — Ambulatory Visit: Payer: BLUE CROSS/BLUE SHIELD | Admitting: Oncology

## 2018-04-26 ENCOUNTER — Ambulatory Visit: Payer: BLUE CROSS/BLUE SHIELD | Attending: Physician Assistant | Admitting: Physical Therapy

## 2018-04-26 ENCOUNTER — Encounter: Payer: Self-pay | Admitting: Physical Therapy

## 2018-04-26 DIAGNOSIS — M533 Sacrococcygeal disorders, not elsewhere classified: Secondary | ICD-10-CM

## 2018-04-26 NOTE — Therapy (Signed)
[place] [facility] [address], [place], [address] Phone: [phone]   Fax:  [phone]  Physical Therapy Treatment  Patient Details  Name: Kaylee Benson MRN: [id number] Date of Birth: 10-15-1971 Referring Provider: Rosanna Randy MD   Encounter Date: 04/26/2018  PT End of Session - 04/26/18 1517    Visit Number  2    Number of Visits  17    PT Start Time  4259    PT Stop Time  1556    PT Time Calculation (min)  39 min    Activity Tolerance  Patient tolerated treatment well    Behavior During Therapy  [facility] for tasks assessed/performed       Past Medical History:  Diagnosis Date  . Allergy    seasonal  . Breast cancer ([place]) right   radiation  . 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 Kidney stone   . Kidney stones   . PONV (postoperative nausea and vomiting)    nausea    Past Surgical History:  Procedure Laterality Date  . AUGMENTATION MAMMAPLASTY    . BREAST BIOPSY Right 2016   LOW GRADE DUCTAL CARCINOMA IN SITU, 0.45 CM IN GREATEST DIMENSION  . BREAST CAPSULECTOMY WITH IMPLANT EXCHANGE Right 01/30/2015   Procedure: BREAST CAPSULECTOMY WITH IMPLANT EXCHANGE;  Surgeon: Bea Graff, MD;  Location: [place];  Service: Plastics;  Laterality: Right;  . BREAST ENHANCEMENT SURGERY Bilateral   . BREAST LUMPECTOMY Right 2016   LOW GRADE DUCTAL CARCINOMA IN SITU, 0.45 CM IN GREATEST DIMENSION  . BREAST LUMPECTOMY WITH NEEDLE LOCALIZATION Right 01/30/2015   Procedure: BREAST LUMPECTOMY WITH NEEDLE LOCALIZATION;  Surgeon: Erroll Luna, MD;  Location: [place];  Service: General;  Laterality: Right;  . BREAST SURGERY     bilateral breast implants 2001  . FOOT SURGERY Left   . LAPAROSCOPIC APPENDECTOMY N/A 11/02/2016   Procedure: APPENDECTOMY LAPAROSCOPIC;  Surgeon: Dia Crawford III, MD;  Location: [facility];  Service: General;  Laterality: N/A;  . MOUTH SURGERY    . ORIF ANKLE FRACTURE      left  . Surgery on left lower leg     2007    There were no vitals filed for this visit.  Subjective Assessment - 04/26/18 1520    Subjective  Pt reports she has been having some pain this past week.  Sunday night she had some L lower back pain and was tossing and turning.  This improved but for the past two days her R lower back has been irritating her.  She moved some patio furniture yesterday morning which aggravated her R lower back region.  Pt has not been completing her HEP.     Pertinent History  Patient arrived 15 minutes late, so eval was shortened accordingly. Patient is a 47 year old female reporting with chronic LBP. Patient reports that she had appendicitis with emergent appenectomy Dec 2017 that she believes started her pain. She believes her pain in the low back started around this time, and pain has gotten progressively worse over the past year and a half. Patient reports pain is localized at R SI joints, which she reports is "what the xray said as well". Patient describes pain as a catch, that shoots sharp pain down the leg. 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 Worst pain in the past week: 5/10 Best: 0/10  Limitations  Lifting;Sitting;House hold activities;Standing;Walking    How long can you sit comfortably?  69mins    How long can you stand comfortably?  13min    How long can you walk comfortably?  23min    Diagnostic tests  03/25/18: DG of SIJ: Sacroiliac joints are patent. Mild sclerosis along the lateral margins of SI joint    Currently in Pain?  Yes    Pain Score  1     Pain Location  Back    Pain Orientation  Lower    Pain Descriptors / Indicators  Aching    Pain Type  Chronic pain    Pain Onset  More than a month ago       TREATMENT   Seated prayer stretch with hands on theraball x10 with 10 second holds   Seated piriformis stretch 2x 30sec holds   Supine hamstring stretch with strap 2x45 seconds each LE    Bil quad stretch in standing 2x30 seconds each LE   Hooklying Bil hip Abd/ER isometrics with strap around knees with 10 second holds x10   Posterior pelvic tilts and TA activation in hooklying with 10 second holds x10   TA activation while marching in hooklying x15 each LE   Dead bug with cues to maintain core contraction throughout 2x10 each UE/LE   Bridges with 10 second holds with cues to activate glutes throughout x10   Paloff press with double BTB 2x15                          PT Education - 04/26/18 1517    Education provided  Yes    Education Details  Exercise technique; emphasized importance of completing HEP    Person(s) Educated  Patient    Methods  Explanation;Demonstration;Verbal cues    Comprehension  Verbalized understanding;Returned demonstration;Verbal cues required;Need further instruction       PT Short Term Goals - 04/19/18 2221      PT SHORT TERM GOAL #1   Title  Pt will be independent with HEP in order to improve strength and balance in order to decrease fall risk and improve function at home and work.    Time  2    Period  Weeks    Status  New        PT Long Term Goals - 04/19/18 2222      PT LONG TERM GOAL #1   Title  Pt will increase gross hip strength by at least 1/2 MMT grade in order to demonstrate improvement in strength and function    Baseline  see eval    Time  8    Period  Weeks    Status  New      PT LONG TERM GOAL #2   Title  Pt will decrease worst pain as reported on NPRS by at least 3 points in order to demonstrate clinically significant reduction in pain    Baseline  5/21 Worst 5/10            Plan - 04/26/18 1526    Clinical Impression Statement  Continued to address tight musculature in BLEs this session to decrease tension with goal to decrease pain. Introduced Product/process development scientist which pt demonstrated and reported fatigue with.  Introduced TA/core strengthening and pt was able to tolerate  progression to TA contraction with marching.  Pt demonstrated fatigue with dead bug exercise.  Pt will benefit from continued skilled PT interventions for decreased  pain and improved QOL.      Rehab Potential  Good    Clinical Impairments Affecting Rehab Potential  (+) young age, lack of other comorbidities, strong social support (-) sedentary lifestyle, chronicity of pain    PT Frequency  2x / week    PT Duration  8 weeks    PT Treatment/Interventions  ADLs/Self Care Home Management;Aquatic Therapy;Moist Heat;Traction;Ultrasound;Electrical Stimulation;Therapeutic activities;Patient/family education;Taping;Therapeutic exercise;Balance training;Neuromuscular re-education;Stair training;Functional mobility training;Manual techniques;Dry needling;Passive range of motion    PT Next Visit Plan  FOTO; hip strengthening, low back/SIJ mobilization    PT Home Exercise Plan  Modified child's pose; hamstring stretch    Consulted and Agree with Plan of Care  Patient       Patient will benefit from skilled therapeutic intervention in order to improve the following deficits and impairments:  Difficulty walking, Improper body mechanics, Impaired tone, Decreased range of motion, Hypermobility, Decreased strength, Decreased activity tolerance, Impaired flexibility, Pain  Visit Diagnosis: Sacrococcygeal disorders, not elsewhere classified     Problem List Patient Active Problem List   Diagnosis Date Noted  . Status post laparoscopic appendectomy 11/09/2016  . Abnormal finding on mammography 09/04/2015  . Personal history of urinary calculi 09/04/2015  . Depression 09/04/2015  . 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 Allergic rhinitis 02/16/2008    Collie Siad PT, DPT 04/26/2018, 3:57 PM  [facility] [address] [place], [address] Phone: [phone]   Fax:  [phone]  Name: Kaylee Benson MRN: [id number] Date of  Birth: [birth year]

## 2018-04-29 ENCOUNTER — Ambulatory Visit: Payer: BLUE CROSS/BLUE SHIELD | Admitting: Oncology

## 2018-05-03 ENCOUNTER — Inpatient Hospital Stay: Payer: BLUE CROSS/BLUE SHIELD | Attending: Oncology | Admitting: Oncology

## 2018-05-03 ENCOUNTER — Ambulatory Visit: Payer: BLUE CROSS/BLUE SHIELD | Admitting: Physical Therapy

## 2018-05-03 ENCOUNTER — Other Ambulatory Visit: Payer: Self-pay | Admitting: Oncology

## 2018-05-03 ENCOUNTER — Encounter: Payer: Self-pay | Admitting: Oncology

## 2018-05-03 ENCOUNTER — Encounter: Payer: Self-pay | Admitting: Physical Therapy

## 2018-05-03 VITALS — BP 108/73 | HR 88 | Temp 98.8°F | Resp 18 | Ht 60.0 in | Wt 129.0 lb

## 2018-05-03 DIAGNOSIS — Z853 Personal history of malignant neoplasm of breast: Secondary | ICD-10-CM

## 2018-05-03 DIAGNOSIS — Z79899 Other long term (current) drug therapy: Secondary | ICD-10-CM

## 2018-05-03 DIAGNOSIS — Z803 Family history of malignant neoplasm of breast: Secondary | ICD-10-CM

## 2018-05-03 DIAGNOSIS — Z86000 Personal history of in-situ neoplasm of breast: Secondary | ICD-10-CM | POA: Diagnosis not present

## 2018-05-03 DIAGNOSIS — G9589 Other specified diseases of spinal cord: Secondary | ICD-10-CM | POA: Diagnosis not present

## 2018-05-03 DIAGNOSIS — Z08 Encounter for follow-up examination after completed treatment for malignant neoplasm: Secondary | ICD-10-CM

## 2018-05-03 DIAGNOSIS — Z79810 Long term (current) use of selective estrogen receptor modulators (SERMs): Secondary | ICD-10-CM | POA: Insufficient documentation

## 2018-05-03 DIAGNOSIS — M533 Sacrococcygeal disorders, not elsewhere classified: Secondary | ICD-10-CM | POA: Diagnosis not present

## 2018-05-03 DIAGNOSIS — D0511 Intraductal carcinoma in situ of right breast: Secondary | ICD-10-CM

## 2018-05-03 DIAGNOSIS — K5900 Constipation, unspecified: Secondary | ICD-10-CM | POA: Diagnosis not present

## 2018-05-03 DIAGNOSIS — Z87442 Personal history of urinary calculi: Secondary | ICD-10-CM | POA: Diagnosis not present

## 2018-05-03 NOTE — Therapy (Signed)
[place] [facility] [address], [place], [address] Phone: [phone]   Fax:  [phone]  Physical Therapy Treatment  Patient Details  Name: Kaylee Benson MRN: [id number] Date of Birth: 08/08/1971 Referring Provider: Rosanna Randy MD   Encounter Date: 05/03/2018  PT End of Session - 05/03/18 1620    Visit Number  3    Number of Visits  17    PT Start Time  0404    PT Stop Time  0446    PT Time Calculation (min)  42 min    Activity Tolerance  Patient tolerated treatment well    Behavior During Therapy  [facility] for tasks assessed/performed       Past Medical History:  Diagnosis Date  . Allergy    seasonal  . Breast cancer ([place]) right   radiation  . 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 Kidney stone   . Kidney stones   . PONV (postoperative nausea and vomiting)    nausea  . Sclerosis of sacroiliac joint     Past Surgical History:  Procedure Laterality Date  . AUGMENTATION MAMMAPLASTY    . BREAST BIOPSY Right 2016   LOW GRADE DUCTAL CARCINOMA IN SITU, 0.45 CM IN GREATEST DIMENSION  . BREAST CAPSULECTOMY WITH IMPLANT EXCHANGE Right 01/30/2015   Procedure: BREAST CAPSULECTOMY WITH IMPLANT EXCHANGE;  Surgeon: Bea Graff, MD;  Location: [place];  Service: Plastics;  Laterality: Right;  . BREAST ENHANCEMENT SURGERY Bilateral   . BREAST LUMPECTOMY Right 2016   LOW GRADE DUCTAL CARCINOMA IN SITU, 0.45 CM IN GREATEST DIMENSION  . BREAST LUMPECTOMY WITH NEEDLE LOCALIZATION Right 01/30/2015   Procedure: BREAST LUMPECTOMY WITH NEEDLE LOCALIZATION;  Surgeon: Erroll Luna, MD;  Location: [place];  Service: General;  Laterality: Right;  . BREAST SURGERY     bilateral breast implants 2001  . FOOT SURGERY Left   . LAPAROSCOPIC APPENDECTOMY N/A 11/02/2016   Procedure: APPENDECTOMY LAPAROSCOPIC;  Surgeon: Dia Crawford III, MD;  Location: [facility];  Service: General;  Laterality: N/A;  . MOUTH  SURGERY    . ORIF ANKLE FRACTURE     left  . Surgery on left lower leg     2007    There were no vitals filed for this visit.  Subjective Assessment - 05/03/18 1608    Subjective  Patient reports no pain today in the R LB, but reports this is only because she is not been doing anything to exacerbate her pain. She reports her pain flared up over the weekend with lifting chairs.     Pertinent History  Patient arrived 15 minutes late, so eval was shortened accordingly. Patient is a 47 year old female reporting with chronic LBP. Patient reports that she had appendicitis with emergent appenectomy Dec 2017 that she believes started her pain. She believes her pain in the low back started around this time, and pain has gotten progressively worse over the past year and a half. Patient reports pain is localized at R SI joints, which she reports is "what the xray said as well". Patient describes pain as a catch, that shoots sharp pain down the leg. 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 Worst pain in the past week: 5/10 Best: 0/10    Limitations  Lifting;Sitting;House hold activities;Standing;Walking    How long can you sit comfortably?  33mins    How long can  you stand comfortably?  70min    How long can you walk comfortably?  61min    Diagnostic tests       Pain Onset  More than a month ago         Manual -STM with trigger point release to bilat paraspinals R>L with 50% tension resolved following  -STM with trigger point release to bilat glute max/min/piriformis R>L with 50% tension resolved following  -Grade I-II L3-S3 CPA mobs for pain modulation 30sec bouts 4 bouts per segment  Therapeutic Activity -Review of posterior pelvic tilt with dead bug exercise with patient requiring max VC/TC to maintain post pelvic tilt with LE involvement -PT used spine/pelvis skeleton to demonstrate to patient posterior pelvic tilt with TA contraction and to  demonstrate the length/tension imbalance with ant pelvic tilt posture (tight/over-activated spinal erectors and hip ext and lengthened/under-activated TA/rectus abdominus/obliques. Following patient led through post pelvic tilt to achieve neutral pelvic/lumbar position in sitting and standing with education on continuing this posture in ADLs to maintain proper length/tension relationship    ESTIM + heat pack Hi[employer]olt ESTIM 15 min at patient tolerated 60V increased to 85V through treatment at bilat lumbar paraspinals . Attempted to decrease muscle tension/spasm in this area. Patient educated on purpose, precautions, and expectations of modality. With PT assessing patient tolerance throughout (decreasing intensity as needed), monitoring skin integrity (normal), with decreased pain noted from patient.                         PT Education - 05/03/18 1619    Education provided  Yes    Education Details  exercise form; posture education    Person(s) Educated  Patient    Methods  Explanation;Demonstration;Verbal cues    Comprehension  Verbal cues required;Returned demonstration;Verbalized understanding       PT Short Term Goals - 04/19/18 2221      PT SHORT TERM GOAL #1   Title  Pt will be independent with HEP in order to improve strength and balance in order to decrease fall risk and improve function at home and work.    Time  2    Period  Weeks    Status  New        PT Long Term Goals - 04/19/18 2222      PT LONG TERM GOAL #1   Title  Pt will increase gross hip strength by at least 1/2 MMT grade in order to demonstrate improvement in strength and function    Baseline  see eval    Time  8    Period  Weeks    Status  New      PT LONG TERM GOAL #2   Title  Pt will decrease worst pain as reported on NPRS by at least 3 points in order to demonstrate clinically significant reduction in pain    Baseline  5/21 Worst 5/10              Patient will benefit from  skilled therapeutic intervention in order to improve the following deficits and impairments:     Visit Diagnosis: No diagnosis found.     Problem List Patient Active Problem List   Diagnosis Date Noted  . Status post laparoscopic appendectomy 11/09/2016  . Abnormal finding on mammography 09/04/2015  . Personal history of urinary calculi 09/04/2015  . Depression 09/04/2015  . 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 Allergic rhinitis 02/16/2008   Shelton Silvas PT, DPT  Shelton Silvas 05/03/2018, 4:21 PM  [place] [facility] [address] [place], [address] Phone: [phone]   Fax:  [phone]  Name: Kaylee Benson MRN: [id number] Date of Birth: 11/30/1970

## 2018-05-03 NOTE — Progress Notes (Signed)
No breast concerns °

## 2018-05-04 NOTE — Progress Notes (Signed)
Hematology/Oncology Consult note [facility]  Telephone:([phone] [phone]  Patient Care Team: Jerrol Banana., MD as PCP - General (Family Medicine)   Name of the patient: Kaylee Benson  [id number]  December 30, 1970   Date of visit: 05/04/18  Diagnosis- right breast dcis   Chief complaint/ Reason for visit- routine f/u of right breast DCIS  Heme/Onc history: Patient is a 47 year old female diagnosed with right breast dcis in 2016 s/p lumpectomy. She was started on tamoxifen after that but developed vaginal bleeding and chest pain following that. CT chest was negative for PE. She was also seen by Gyn and there was no evidence of endometrial cancer. She has remained off tamoxifen since then. Family history of breast cancer in her mother. She did undergo genetic testing and was negative. She also has b/l breast implants   Interval history- overall she is doing well. Appetite is good. She denies any unintentional weight loss. She did have some low back pain and was diagnosed with SI joint sclerosis. PT has not helped  ECOG PS- 0 Pain scale- 0 Opioid associated constipation- 0  Review of systems- Review of Systems  Constitutional: Negative for chills, fever, malaise/fatigue and weight loss.  HENT: Negative for congestion, ear discharge and nosebleeds.   Eyes: Negative for blurred vision.  Respiratory: Negative for cough, hemoptysis, sputum production, shortness of breath and wheezing.   Cardiovascular: Negative for chest pain, palpitations, orthopnea and claudication.  Gastrointestinal: Negative for abdominal pain, blood in stool, constipation, diarrhea, heartburn, melena, nausea and vomiting.  Genitourinary: Negative for dysuria, flank pain, frequency, hematuria and urgency.  Musculoskeletal: Negative for back pain, joint pain and myalgias.  Skin: Negative for rash.  Neurological: Negative for dizziness, tingling, focal weakness, seizures,  weakness and headaches.  Endo/Heme/Allergies: Does not bruise/bleed easily.  Psychiatric/Behavioral: Negative for depression and suicidal ideas. The patient does not have insomnia.    Breast exam was performed in seated and lying down position. Patient is status post right lumpectomy with a well-healed surgical scar. No evidence of any palpable masses. No evidence of axillary adenopathy. No evidence of any palpable masses or lumps in the left breast. No evidence of left axillary adenopathy. B/l breast implants in place    No Known Allergies   Past Medical History:  Diagnosis Date  . Allergy    seasonal  . Breast cancer ([place]) right   radiation  . 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 Kidney stone   . Kidney stones   . PONV (postoperative nausea and vomiting)    nausea  . Sclerosis of sacroiliac joint      Past Surgical History:  Procedure Laterality Date  . AUGMENTATION MAMMAPLASTY    . BREAST BIOPSY Right 2016   LOW GRADE DUCTAL CARCINOMA IN SITU, 0.45 CM IN GREATEST DIMENSION  . BREAST CAPSULECTOMY WITH IMPLANT EXCHANGE Right 01/30/2015   Procedure: BREAST CAPSULECTOMY WITH IMPLANT EXCHANGE;  Surgeon: Bea Graff, MD;  Location: [place];  Service: Plastics;  Laterality: Right;  . BREAST ENHANCEMENT SURGERY Bilateral   . BREAST LUMPECTOMY Right 2016   LOW GRADE DUCTAL CARCINOMA IN SITU, 0.45 CM IN GREATEST DIMENSION  . BREAST LUMPECTOMY WITH NEEDLE LOCALIZATION Right 01/30/2015   Procedure: BREAST LUMPECTOMY WITH NEEDLE LOCALIZATION;  Surgeon: Erroll Luna, MD;  Location: [place];  Service: General;  Laterality: Right;  . BREAST SURGERY     bilateral breast implants 2001  . FOOT SURGERY Left   .  LAPAROSCOPIC APPENDECTOMY N/A 11/02/2016   Procedure: APPENDECTOMY LAPAROSCOPIC;  Surgeon: Dia Crawford III, MD;  Location: [facility];  Service: General;  Laterality: N/A;  . MOUTH SURGERY    . ORIF ANKLE FRACTURE     left  . Surgery on left lower  leg     2007    Social History   Socioeconomic History  . Marital status: Married    Spouse name: Not on file  . Number of children: Not on file  . Years of education: Not on file  . Highest education level: Not on file  Occupational History  . Not on file  Social Needs  . Financial resource strain: Not on file  . Food insecurity:    Worry: Not on file    Inability: Not on file  . Transportation needs:    Medical: Not on file    Non-medical: Not on file  Tobacco Use  . Smoking status: Never Smoker  . Smokeless tobacco: Never Used  Substance and Sexual Activity  . Alcohol use: Yes    Comment: Drinks glass of wine 1-2 a month  . Drug use: No  . Sexual activity: Yes    Birth control/protection: Other-see comments    Comment: Ess[place] procedure done 1 year ago, menarche age 80, G65, P 1, maternal age 90-25  Lifestyle  . Physical activity:    Days per week: Not on file    Minutes per session: Not on file  . Stress: Not on file  Relationships  . Social connections:    Talks on phone: Not on file    Gets together: Not on file    Attends religious service: Not on file    Active member of club or organization: Not on file    Attends meetings of clubs or organizations: Not on file    Relationship status: Not on file  . Intimate partner violence:    Fear of current or ex partner: Not on file    Emotionally abused: Not on file    Physically abused: Not on file    Forced sexual activity: Not on file  Other Topics Concern  . Not on file  Social History Narrative  . Not on file    Family History  Problem Relation Age of Onset  . Breast cancer Mother 2  . Heart disease Mother   . Hypertension Mother      Current Outpatient Medications:  .  BIOTIN PO, Take 1 tablet by mouth. Reported on 04/22/2016, Disp: , Rfl:  .  Calcium Carbonate-Vitamin D (CALCIUM + D PO), Take by mouth once., Disp: , Rfl:  .  fluticasone (FLONASE) 50 MCG/ACT nasal spray, Place 2 sprays into both  nostrils daily., Disp: 16 g, Rfl: 12 .  Multiple Vitamin (MULTIVITAMIN) tablet, Take 1 tablet by mouth daily., Disp: , Rfl:  .  naproxen sodium (ANAPROX) 220 MG tablet, Take 220 mg by mouth as needed., Disp: , Rfl:  .  Omega-3 Fatty Acids (FISH OIL BURP-LESS) 1000 MG CAPS, Take 2 capsules by mouth daily., Disp: , Rfl:  .  Turmeric 500 MG TABS, Take 2 capsules by mouth daily., Disp: , Rfl:  .  cyclobenzaprine (FLEXERIL) 5 MG tablet, Take 1 tablet (5 mg total) by mouth 3 (three) times daily as needed for muscle spasms. (Patient not taking: Reported on 05/03/2018), Disp: 30 tablet, Rfl: 1 .  Ferrous Sulfate 90 (18 Fe) MG TABS, Take by mouth., Disp: , Rfl:   Physical exam:  Vitals:  05/03/18 1502  BP: 108/73  Pulse: 88  Resp: 18  Temp: 98.8 F (37.1 C)  TempSrc: Tympanic  Weight: 129 lb (58.5 kg)  Height: 5' (1.524 m)   Physical Exam  Constitutional: She is oriented to person, place, and time. She appears well-developed and well-nourished.  HENT:  Head: Normocephalic and atraumatic.  Eyes: Pupils are equal, round, and reactive to light. EOM are normal.  Neck: Normal range of motion.  Cardiovascular: Normal rate, regular rhythm and normal heart sounds.  Pulmonary/Chest: Effort normal and breath sounds normal.  Abdominal: Soft. Bowel sounds are normal.  Neurological: She is alert and oriented to person, place, and time.  Skin: Skin is warm and dry.     CMP Latest Ref Rng & Units 02/09/2018  Glucose 65 - 99 mg/dL 82  BUN 6 - 24 mg/dL 17  Creatinine 0.57 - 1.00 mg/dL 0.81  Sodium 134 - 144 mmol/L 139  Potassium 3.5 - 5.2 mmol/L 4.1  Chloride 96 - 106 mmol/L 101  CO2 20 - 29 mmol/L 24  Calcium 8.7 - 10.2 mg/dL 9.2  Total Protein 6.0 - 8.5 g/dL 6.4  Total Bilirubin 0.0 - 1.2 mg/dL 0.2  Alkaline Phos 39 - 117 IU/L 48  AST 0 - 40 IU/L 19  ALT 0 - 32 IU/L 13   CBC Latest Ref Rng & Units 02/09/2018  WBC 3.4 - 10.8 x10E3/uL 4.3  Hemoglobin 11.1 - 15.9 g/dL 11.4  Hematocrit 34.0 -  46.6 % 36.3  Platelets 150 - 379 x10E3/uL 306     Assessment and plan- Patient is a 47 y.o. female  with right breast DCIS in 2016 s/p lumpectomy. She is off endocrine therapy and currently on surveillance  Clinically she is doing well. No evidence of recurrence on todays exam. Mammogram from feb 2019 showed no evidence of malignancy  I will see her back in 1 year. Mammogram prior   Visit Diagnosis 1. Ductal carcinoma in situ (DCIS) of right breast   2. Encounter for follow-up surveillance of breast cancer      Dr. Randa Evens, MD, MPH [facility] at [facility] [phone] 05/04/2018 2:38 PM

## 2018-05-05 ENCOUNTER — Encounter: Payer: Self-pay | Admitting: Physical Therapy

## 2018-05-05 ENCOUNTER — Ambulatory Visit: Payer: BLUE CROSS/BLUE SHIELD | Admitting: Physical Therapy

## 2018-05-05 DIAGNOSIS — M533 Sacrococcygeal disorders, not elsewhere classified: Secondary | ICD-10-CM

## 2018-05-05 NOTE — Therapy (Signed)
[facility] [address] [place], [address] Phone: [phone]   Fax:  [phone]  Physical Therapy Treatment  Patient Details  Name: Kaylee Benson MRN: [id number] Date of Birth: 24-Oct-1971 Referring Provider: Rosanna Randy MD   Encounter Date: 05/05/2018    Past Medical History:  Diagnosis Date  . Allergy    seasonal  . Breast cancer ([place]) right   radiation  . 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 Kidney stone   . Kidney stones   . PONV (postoperative nausea and vomiting)    nausea  . Sclerosis of sacroiliac joint     Past Surgical History:  Procedure Laterality Date  . AUGMENTATION MAMMAPLASTY    . BREAST BIOPSY Right 2016   LOW GRADE DUCTAL CARCINOMA IN SITU, 0.45 CM IN GREATEST DIMENSION  . BREAST CAPSULECTOMY WITH IMPLANT EXCHANGE Right 01/30/2015   Procedure: BREAST CAPSULECTOMY WITH IMPLANT EXCHANGE;  Surgeon: Bea Graff, MD;  Location: [place];  Service: Plastics;  Laterality: Right;  . BREAST ENHANCEMENT SURGERY Bilateral   . BREAST LUMPECTOMY Right 2016   LOW GRADE DUCTAL CARCINOMA IN SITU, 0.45 CM IN GREATEST DIMENSION  . BREAST LUMPECTOMY WITH NEEDLE LOCALIZATION Right 01/30/2015   Procedure: BREAST LUMPECTOMY WITH NEEDLE LOCALIZATION;  Surgeon: Erroll Luna, MD;  Location: [place];  Service: General;  Laterality: Right;  . BREAST SURGERY     bilateral breast implants 2001  . FOOT SURGERY Left   . LAPAROSCOPIC APPENDECTOMY N/A 11/02/2016   Procedure: APPENDECTOMY LAPAROSCOPIC;  Surgeon: Dia Crawford III, MD;  Location: [facility];  Service: General;  Laterality: N/A;  . MOUTH SURGERY    . ORIF ANKLE FRACTURE     left  . Surgery on left lower leg     2007    There were no vitals filed for this visit.  Subjective Assessment - 05/05/18 1707    Subjective  Patient reports she thinks the ESTIM helped "a lot" following last session to decrease tightness.  Patient reports minimal pain since last visit. Patient reports compliance with HEP, no questions/concerns.     Pertinent History  Patient arrived 15 minutes late, so eval was shortened accordingly. Patient is a 47 year old female reporting with chronic LBP. Patient reports that she had appendicitis with emergent appenectomy Dec 2017 that she believes started her pain. She believes her pain in the low back started around this time, and pain has gotten progressively worse over the past year and a half. Patient reports pain is localized at R SI joints, which she reports is "what the xray said as well". Patient describes pain as a catch, that shoots sharp pain down the leg. 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 Worst pain in the past week: 5/10 Best: 0/10    Limitations  Lifting;Sitting;House hold activities;Standing;Walking    How long can you sit comfortably?  90mins    How long can you stand comfortably?  13min    How long can you walk comfortably?  25min    Pain Onset  More than a month ago            Manual -STM with trigger point release to bilat paraspinals R>L with 50% tension resolved following  -STM with trigger point release to bilat glute max/min/piriformis R>L with 50% tension resolved following  -Grade I-II L3-S3 CPA mobs for pain modulation 30sec bouts 4  bouts per segment  Therapeutic Activity -Dead bug exercise 1x 10 (each LE) LE only with min cuing to maintain post pelvic tilt -Dead bug exercise 1x 8 (each LE) LE and UE with TC cuing with PT placing hand under patients LB with cue to "push into mat table -Hooklying windshield wipers for oblique contraction 3x 10 (each) with max cuing needed from proper oblique   ESTIM+ heat packHiVolt ESTIM15 min at patient tolerated90Vincreased to100V through treatmentat bilat lumbar paraspinals. Attempted d/t previous session sucess. With PT assessing patient tolerance throughout  (decreasing intensity as needed), monitoring skin integrity (normal), with decreased/tension pain noted from patient.                         PT Short Term Goals - 04/19/18 2221      PT SHORT TERM GOAL #1   Title  Pt will be independent with HEP in order to improve strength and balance in order to decrease fall risk and improve function at home and work.    Time  2    Period  Weeks    Status  New        PT Long Term Goals - 04/19/18 2222      PT LONG TERM GOAL #1   Title  Pt will increase gross hip strength by at least 1/2 MMT grade in order to demonstrate improvement in strength and function    Baseline  see eval    Time  8    Period  Weeks    Status  New      PT LONG TERM GOAL #2   Title  Pt will decrease worst pain as reported on NPRS by at least 3 points in order to demonstrate clinically significant reduction in pain    Baseline  5/21 Worst 5/10              Patient will benefit from skilled therapeutic intervention in order to improve the following deficits and impairments:     Visit Diagnosis: No diagnosis found.     Problem List Patient Active Problem List   Diagnosis Date Noted  . Status post laparoscopic appendectomy 11/09/2016  . Abnormal finding on mammography 09/04/2015  . Personal history of urinary calculi 09/04/2015  . Depression 09/04/2015  . 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 Allergic rhinitis 02/16/2008   Shelton Silvas PT, DPT Shelton Silvas 05/05/2018, 5:09 PM  [facility] [address], [place], [address] Phone: [phone]   Fax:  [phone]  Name: Kaylee Benson MRN: [id number] Date of Birth: May 28, 1971

## 2018-05-10 ENCOUNTER — Ambulatory Visit: Payer: BLUE CROSS/BLUE SHIELD | Admitting: Physical Therapy

## 2018-05-11 ENCOUNTER — Encounter: Payer: Self-pay | Admitting: Physical Therapy

## 2018-05-11 ENCOUNTER — Ambulatory Visit: Payer: BLUE CROSS/BLUE SHIELD | Admitting: Physical Therapy

## 2018-05-11 DIAGNOSIS — M533 Sacrococcygeal disorders, not elsewhere classified: Secondary | ICD-10-CM

## 2018-05-11 NOTE — Therapy (Signed)
[facility] [address], [place], [address] Phone: [phone]   Fax:  [phone]  Physical Therapy Treatment  Patient Details  Name: Kaylee Benson MRN: [id number] Date of Birth: 28-Jul-1971 Referring Provider: Rosanna Randy MD   Encounter Date: 05/11/2018  PT End of Session - 05/11/18 0931    Visit Number  5    Number of Visits  17    PT Start Time  0900    PT Stop Time  0945    PT Time Calculation (min)  45 min    Activity Tolerance  Patient tolerated treatment well    Behavior During Therapy  [facility] for tasks assessed/performed       Past Medical History:  Diagnosis Date  . Allergy    seasonal  . Breast cancer ([place]) right   radiation  . 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 Kidney stone   . Kidney stones   . PONV (postoperative nausea and vomiting)    nausea  . Sclerosis of sacroiliac joint     Past Surgical History:  Procedure Laterality Date  . AUGMENTATION MAMMAPLASTY    . BREAST BIOPSY Right 2016   LOW GRADE DUCTAL CARCINOMA IN SITU, 0.45 CM IN GREATEST DIMENSION  . BREAST CAPSULECTOMY WITH IMPLANT EXCHANGE Right 01/30/2015   Procedure: BREAST CAPSULECTOMY WITH IMPLANT EXCHANGE;  Surgeon: Bea Graff, MD;  Location: [place];  Service: Plastics;  Laterality: Right;  . BREAST ENHANCEMENT SURGERY Bilateral   . BREAST LUMPECTOMY Right 2016   LOW GRADE DUCTAL CARCINOMA IN SITU, 0.45 CM IN GREATEST DIMENSION  . BREAST LUMPECTOMY WITH NEEDLE LOCALIZATION Right 01/30/2015   Procedure: BREAST LUMPECTOMY WITH NEEDLE LOCALIZATION;  Surgeon: Erroll Luna, MD;  Location: [place];  Service: General;  Laterality: Right;  . BREAST SURGERY     bilateral breast implants 2001  . FOOT SURGERY Left   . LAPAROSCOPIC APPENDECTOMY N/A 11/02/2016   Procedure: APPENDECTOMY LAPAROSCOPIC;  Surgeon: Dia Crawford III, MD;  Location: [facility];  Service: General;  Laterality: N/A;  . MOUTH  SURGERY    . ORIF ANKLE FRACTURE     left  . Surgery on left lower leg     2007    There were no vitals filed for this visit.  Subjective Assessment - 05/11/18 0909    Subjective  Patient continues to report that ESTIM + heat is helping a lot. Patient reports that she had some pain "pulling a grandchild out of the pool" but is "doing a better job" of adjusting her squat form to prevent this. Patient reports compliance with her HEP. Reports 2/10 pain today after being "very active yesterday".     Pertinent History  Patient arrived 15 minutes late, so eval was shortened accordingly. Patient is a 47 year old female reporting with chronic LBP. Patient reports that she had appendicitis with emergent appenectomy Dec 2017 that she believes started her pain. She believes her pain in the low back started around this time, and pain has gotten progressively worse over the past year and a half. Patient reports pain is localized at R SI joints, which she reports is "what the xray said as well". Patient describes pain as a catch, that shoots sharp pain down the leg. 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 Worst pain in the past week: 5/10 Best: 0/10    Limitations  Lifting;Sitting;House hold  activities;Standing;Walking    How long can you sit comfortably?  53mins    How long can you stand comfortably?  57min    How long can you walk comfortably?  63min    Pain Onset  More than a month ago          Manual -STM withtrigger point releaseto bilat paraspinalsR>L with 50% tension resolved following -STM withtrigger point releaseto bilatglute max/min/piriformis L>R with 50% tension resolved following  -Grade I-II L3-S3 CPA mobs for pain modulation 30sec bouts 4 bouts per segment  Therapeutic Activity -Dead bug exercise 1x 8 (each LE) LE and UE with TC cuing with PT placing hand under patients LB with cue to "push into mat table -Palloff press green  tband 2x 10 each side with min cuing for eccentric control -TA pulldowns 2x 10 with min cuing for proper form   ESTIM+ heat packHiVolt ESTIM15 min at patient tolerated100Vincreased to110V through treatmentat bilatlumbar paraspinals. Attempted d/t previous session sucess.With PT assessing patient tolerance throughout (decreasing intensity as needed), monitoring skin integrity (normal), with decreased/tension pain noted from patient.                       PT Education - 05/11/18 0931    Education provided  Yes    Education Details  Exercise form    Person(s) Educated  Patient    Methods  Explanation;Verbal cues;Tactile cues;Demonstration    Comprehension  Verbalized understanding;Returned demonstration;Tactile cues required;Verbal cues required       PT Short Term Goals - 04/19/18 2221      PT SHORT TERM GOAL #1   Title  Pt will be independent with HEP in order to improve strength and balance in order to decrease fall risk and improve function at home and work.    Time  2    Period  Weeks    Status  New        PT Long Term Goals - 04/19/18 2222      PT LONG TERM GOAL #1   Title  Pt will increase gross hip strength by at least 1/2 MMT grade in order to demonstrate improvement in strength and function    Baseline  see eval    Time  8    Period  Weeks    Status  New      PT LONG TERM GOAL #2   Title  Pt will decrease worst pain as reported on NPRS by at least 3 points in order to demonstrate clinically significant reduction in pain    Baseline  5/21 Worst 5/10            Plan - 05/11/18 1015    Clinical Impression Statement  Patient is continuing to respond well to modality + manual treatment with decreased subjective and objective tissue tension following. Patient is demonstrating good carry over of core activation between sessions allowing PT to progress therex requiring min cuing for proper form, which patient is able to comply with and  complete therex with 100% accuracy following.     Rehab Potential  Good    Clinical Impairments Affecting Rehab Potential  (+) young age, lack of other comorbidities, strong social support (-) sedentary lifestyle, chronicity of pain    PT Frequency  2x / week    PT Duration  8 weeks    PT Treatment/Interventions  ADLs/Self Care Home Management;Aquatic Therapy;Moist Heat;Traction;Ultrasound;Electrical Stimulation;Therapeutic activities;Patient/family education;Taping;Therapeutic exercise;Balance training;Neuromuscular re-education;Stair training;Functional mobility training;Manual techniques;Dry needling;Passive range of motion  PT Next Visit Plan  FOTO; hip strengthening, low back/SIJ mobilization    PT Home Exercise Plan  Modified child's pose; hamstring stretch    Consulted and Agree with Plan of Care  Patient       Patient will benefit from skilled therapeutic intervention in order to improve the following deficits and impairments:  Difficulty walking, Improper body mechanics, Impaired tone, Decreased range of motion, Hypermobility, Decreased strength, Decreased activity tolerance, Impaired flexibility, Pain  Visit Diagnosis: Sacrococcygeal disorders, not elsewhere classified     Problem List Patient Active Problem List   Diagnosis Date Noted  . Status post laparoscopic appendectomy 11/09/2016  . Abnormal finding on mammography 09/04/2015  . Personal history of urinary calculi 09/04/2015  . Depression 09/04/2015  . 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 Allergic rhinitis 02/16/2008   Shelton Silvas PT, DPT Shelton Silvas 05/11/2018, 10:18 AM  [facility] [address] [place], [address] Phone: [phone]   Fax:  [phone]  Name: Kaylee Benson MRN: [id number] Date of Birth: May 09, 1971

## 2018-05-12 ENCOUNTER — Ambulatory Visit: Payer: BLUE CROSS/BLUE SHIELD | Admitting: Physical Therapy

## 2018-05-17 ENCOUNTER — Encounter: Payer: Self-pay | Admitting: Physical Therapy

## 2018-05-17 ENCOUNTER — Ambulatory Visit: Payer: BLUE CROSS/BLUE SHIELD | Admitting: Physical Therapy

## 2018-05-17 DIAGNOSIS — M533 Sacrococcygeal disorders, not elsewhere classified: Secondary | ICD-10-CM

## 2018-05-17 NOTE — Therapy (Signed)
[facility] [address], [place], [address] Phone: [phone]   Fax:  [phone]  Physical Therapy Treatment  Patient Details  Name: Kaylee Benson MRN: [id number] Date of Birth: Mar 08, 1971 Referring Provider: Rosanna Randy MD   Encounter Date: 05/17/2018  PT End of Session - 05/17/18 1811    Visit Number  6    Number of Visits  17    PT Start Time  0600    PT Stop Time  0630    PT Time Calculation (min)  30 min    Activity Tolerance  Patient tolerated treatment well    Behavior During Therapy  [facility] for tasks assessed/performed       Past Medical History:  Diagnosis Date  . Allergy    seasonal  . Breast cancer ([place]) right   radiation  . 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 Kidney stone   . Kidney stones   . PONV (postoperative nausea and vomiting)    nausea  . Sclerosis of sacroiliac joint     Past Surgical History:  Procedure Laterality Date  . AUGMENTATION MAMMAPLASTY    . BREAST BIOPSY Right 2016   LOW GRADE DUCTAL CARCINOMA IN SITU, 0.45 CM IN GREATEST DIMENSION  . BREAST CAPSULECTOMY WITH IMPLANT EXCHANGE Right 01/30/2015   Procedure: BREAST CAPSULECTOMY WITH IMPLANT EXCHANGE;  Surgeon: Bea Graff, MD;  Location: [place];  Service: Plastics;  Laterality: Right;  . BREAST ENHANCEMENT SURGERY Bilateral   . BREAST LUMPECTOMY Right 2016   LOW GRADE DUCTAL CARCINOMA IN SITU, 0.45 CM IN GREATEST DIMENSION  . BREAST LUMPECTOMY WITH NEEDLE LOCALIZATION Right 01/30/2015   Procedure: BREAST LUMPECTOMY WITH NEEDLE LOCALIZATION;  Surgeon: Erroll Luna, MD;  Location: [facility];  Service: General;  Laterality: Right;  . BREAST SURGERY     bilateral breast implants 2001  . FOOT SURGERY Left   . LAPAROSCOPIC APPENDECTOMY N/A 11/02/2016   Procedure: APPENDECTOMY LAPAROSCOPIC;  Surgeon: Dia Crawford III, MD;  Location: [facility];  Service: General;  Laterality: N/A;  . MOUTH  SURGERY    . ORIF ANKLE FRACTURE     left  . Surgery on left lower leg     2007    There were no vitals filed for this visit.  Subjective Assessment - 05/17/18 1807    Subjective  Patient is 17 minutes late, so session is shortened accordingly. Patient reports decreased pain, that is aching in nature at the SIJ/LB. Patient reports pain not exceeding 2/10 over the weekend, reporting that she feels like her muscles are still just very tight, and it is difficult to "get rid of this tightness". Patient is starting to notice she is having increased tightness when standing from prolonged sitting. Patient reports compliance with HEP.     Pertinent History  Patient arrived 15 minutes late, so eval was shortened accordingly. Patient is a 47 year old female reporting with chronic LBP. Patient reports that she had appendicitis with emergent appenectomy Dec 2017 that she believes started her pain. She believes her pain in the low back started around this time, and pain has gotten progressively worse over the past year and a half. Patient reports pain is localized at R SI joints, which she reports is "what the xray said as well". Patient describes pain as a catch, that shoots sharp pain down the leg. 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 Worst pain in the past week: 5/10 Best: 0/10    Limitations  Lifting;Sitting;House hold activities;Standing;Walking    How long can you sit comfortably?  47mins    How long can you stand comfortably?  37min    How long can you walk comfortably?  73min    Pain Onset  More than a month ago             Manual -STM withtrigger point releaseto bilat paraspinalsR>L with 50% tension resolved following. Following manual techniques: Dry needling(4) 28mm .30needles placed along  trigger points in bilat lower lumbar paraspinals, utilizing shelving technique, d/tincreased muscular spasms and trigger points with the patient  positioned inprone. Patient was educated on risks and benefits of therapy and verbally consents to PT.  -STM withtrigger point releaseto bilatglute max/min/piriformis L>R with 50% tension resolved following      ESTIM+ heat packHiVolt ESTIM10 min at patient tolerated110Vincreased to125V through treatmentat bilatlumbar paraspinals.Attempted d/t previous session sucess.With PT assessing patient tolerance throughout (decreasing intensity as needed), monitoring skin integrity (normal), with decreased/tensionpain noted from patient.                    PT Education - 05/17/18 1811    Education provided  Yes    Education Details  DN education    Person(s) Educated  Patient    Methods  Explanation;Demonstration;Tactile cues;Verbal cues;Handout    Comprehension  Verbalized understanding;Returned demonstration;Verbal cues required;Tactile cues required       PT Short Term Goals - 04/19/18 2221      PT SHORT TERM GOAL #1   Title  Pt will be independent with HEP in order to improve strength and balance in order to decrease fall risk and improve function at home and work.    Time  2    Period  Weeks    Status  New        PT Long Term Goals - 04/19/18 2222      PT LONG TERM GOAL #1   Title  Pt will increase gross hip strength by at least 1/2 MMT grade in order to demonstrate improvement in strength and function    Baseline  see eval    Time  8    Period  Weeks    Status  New      PT LONG TERM GOAL #2   Title  Pt will decrease worst pain as reported on NPRS by at least 3 points in order to demonstrate clinically significant reduction in pain    Baseline  5/21 Worst 5/10            Plan - 05/17/18 2229    Clinical Impression Statement  D/t time restriction and patient reported cheif c/o trigger points in lower lumbar paraspinals, PT utilized DN + manual techniques for soft tissue lengthening. Patient with local twitch response in R paraspinals >  L. Patient reports pain resolution with "loosening" sensation noted after. PT continued to encourage HEP for maintainance with core strengthening.     Rehab Potential  Good    Clinical Impairments Affecting Rehab Potential  (+) young age, lack of other comorbidities, strong social support (-) sedentary lifestyle, chronicity of pain    PT Frequency  2x / week    PT Duration  8 weeks    PT Treatment/Interventions  ADLs/Self Care Home Management;Aquatic Therapy;Moist Heat;Traction;Ultrasound;Electrical Stimulation;Therapeutic activities;Patient/family education;Taping;Therapeutic exercise;Balance training;Neuromuscular re-education;Stair training;Functional mobility training;Manual techniques;Dry needling;Passive range of motion    PT Next Visit  Plan  core and hip strengthening, low back/SIJ mobilization    PT Home Exercise Plan  Modified child's pose; hamstring stretch, Dead bug, TA marching    Consulted and Agree with Plan of Care  Patient       Patient will benefit from skilled therapeutic intervention in order to improve the following deficits and impairments:  Difficulty walking, Improper body mechanics, Impaired tone, Decreased range of motion, Hypermobility, Decreased strength, Decreased activity tolerance, Impaired flexibility, Pain  Visit Diagnosis: Sacrococcygeal disorders, not elsewhere classified     Problem List Patient Active Problem List   Diagnosis Date Noted  . Status post laparoscopic appendectomy 11/09/2016  . Abnormal finding on mammography 09/04/2015  . Personal history of urinary calculi 09/04/2015  . Depression 09/04/2015  . 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 Allergic rhinitis 02/16/2008   Shelton Silvas PT, DPT Shelton Silvas 05/17/2018, 10:32 PM  [facility] [address] [place], [address] Phone: [phone]   Fax:  [phone]  Name: Kaylee Benson MRN: [id number] Date of  Birth: 11/07/1971

## 2018-05-18 ENCOUNTER — Ambulatory Visit: Payer: BLUE CROSS/BLUE SHIELD | Admitting: Physical Therapy

## 2018-05-20 ENCOUNTER — Ambulatory Visit: Payer: BLUE CROSS/BLUE SHIELD | Admitting: Physical Therapy

## 2018-05-23 ENCOUNTER — Ambulatory Visit: Payer: BLUE CROSS/BLUE SHIELD | Admitting: Physical Therapy

## 2018-05-27 ENCOUNTER — Encounter: Payer: BLUE CROSS/BLUE SHIELD | Admitting: Physical Therapy

## 2018-05-30 ENCOUNTER — Ambulatory Visit: Payer: BLUE CROSS/BLUE SHIELD | Attending: Physician Assistant | Admitting: Physical Therapy

## 2018-06-03 ENCOUNTER — Ambulatory Visit: Payer: BLUE CROSS/BLUE SHIELD | Admitting: Physical Therapy

## 2018-06-06 ENCOUNTER — Ambulatory Visit: Payer: BLUE CROSS/BLUE SHIELD | Admitting: Physical Therapy

## 2018-06-07 ENCOUNTER — Ambulatory Visit: Payer: BLUE CROSS/BLUE SHIELD | Admitting: Physical Therapy

## 2018-06-10 ENCOUNTER — Ambulatory Visit: Payer: BLUE CROSS/BLUE SHIELD | Admitting: Physical Therapy

## 2018-06-22 ENCOUNTER — Ambulatory Visit: Payer: BLUE CROSS/BLUE SHIELD | Admitting: Physical Therapy

## 2018-06-23 ENCOUNTER — Ambulatory Visit: Payer: BLUE CROSS/BLUE SHIELD | Admitting: Physical Therapy

## 2018-06-28 DIAGNOSIS — F439 Reaction to severe stress, unspecified: Secondary | ICD-10-CM | POA: Diagnosis not present

## 2018-10-12 ENCOUNTER — Ambulatory Visit (INDEPENDENT_AMBULATORY_CARE_PROVIDER_SITE_OTHER): Payer: BLUE CROSS/BLUE SHIELD

## 2018-10-12 DIAGNOSIS — Z23 Encounter for immunization: Secondary | ICD-10-CM

## 2018-10-19 DIAGNOSIS — L988 Other specified disorders of the skin and subcutaneous tissue: Secondary | ICD-10-CM | POA: Diagnosis not present

## 2018-10-19 DIAGNOSIS — D485 Neoplasm of uncertain behavior of skin: Secondary | ICD-10-CM | POA: Diagnosis not present

## 2018-10-19 DIAGNOSIS — D225 Melanocytic nevi of trunk: Secondary | ICD-10-CM | POA: Diagnosis not present

## 2018-10-19 DIAGNOSIS — D2230 Melanocytic nevi of unspecified part of face: Secondary | ICD-10-CM | POA: Diagnosis not present

## 2018-10-19 DIAGNOSIS — D224 Melanocytic nevi of scalp and neck: Secondary | ICD-10-CM | POA: Diagnosis not present

## 2018-12-07 ENCOUNTER — Ambulatory Visit: Payer: Self-pay | Admitting: Radiation Oncology

## 2018-12-19 ENCOUNTER — Other Ambulatory Visit: Payer: Self-pay

## 2018-12-19 ENCOUNTER — Encounter: Payer: Self-pay | Admitting: Radiation Oncology

## 2018-12-19 ENCOUNTER — Ambulatory Visit
Admission: RE | Admit: 2018-12-19 | Discharge: 2018-12-19 | Disposition: A | Discharge status: Home / Self Care | Payer: PRIVATE HEALTH INSURANCE | Source: Ambulatory Visit | Attending: Radiation Oncology | Admitting: Radiation Oncology

## 2018-12-19 ENCOUNTER — Encounter (INDEPENDENT_AMBULATORY_CARE_PROVIDER_SITE_OTHER): Payer: Self-pay

## 2018-12-19 VITALS — BP 129/77 | HR 73 | Temp 97.6°F | Resp 18 | Wt 132.5 lb

## 2018-12-19 DIAGNOSIS — Z923 Personal history of irradiation: Secondary | ICD-10-CM | POA: Insufficient documentation

## 2018-12-19 DIAGNOSIS — Z86000 Personal history of in-situ neoplasm of breast: Secondary | ICD-10-CM | POA: Insufficient documentation

## 2018-12-19 DIAGNOSIS — D0511 Intraductal carcinoma in situ of right breast: Secondary | ICD-10-CM

## 2018-12-19 NOTE — Progress Notes (Signed)
Radiation Oncology Follow up Note  Name: Kaylee Benson   Date:   12/19/2018 MRN:  [id number] DOB: 1971/02/24    This 48 y.o. female presents to the clinic today3-1/2 yearYear follow-up status post whole breast radiation.to her right breast for ER/PR positive ductal carcinoma in situ  REFERRING PROVIDER: Jerrol Banana.,*  HPI: patient is a 48 year old female now out 3-1/2 years.status post wide local excision for ER/PR positive ductal carcinoma in situ and adjuvant whole breast radiationto her right breast. She is seen today in routine follow-up is doing well patient was unable to take antiestrogen therapybased on side effect profile. She seen today in routine follow up is doing well she specifically denies breast tenderness cough or bone pain.mammograms performed back in February which I've reviewed were BI-RADS 2 benign. She has bilateral breast implants.  COMPLICATIONS OF TREATMENT: none  FOLLOW UP COMPLIANCE: keeps appointments   PHYSICAL EXAM:  BP 129/77 (BP Location: Left Arm, Patient Position: Sitting)   Pulse 73   Temp 97.6 F (36.4 C) (Tympanic)   Resp 18   Wt 132 lb 7.9 oz (60.1 kg)   BMI 25.88 kg/m  Lungs are clear to A&P cardiac examination essentially unremarkable with regular rate and rhythm. No dominant mass or nodularity is noted in either breast in 2 positions examined. Incision is well-healed. No axillary or supraclavicular adenopathy is appreciated. Cosmetic result is excellent.Well-developed well-nourished patient in NAD. HEENT reveals PERLA, EOMI, discs not visualized.  Oral cavity is clear. No oral mucosal lesions are identified. Neck is clear without evidence of cervical or supraclavicular adenopathy. Lungs are clear to A&P. Cardiac examination is essentially unremarkable with regular rate and rhythm without murmur rub or thrill. Abdomen is benign with no organomegaly or masses noted. Motor sensory and DTR levels are equal and symmetric in the upper and lower  extremities. Cranial nerves II through XII are grossly intact. Proprioception is intact. No peripheral adenopathy or edema is identified. No motor or sensory levels are noted. Crude visual fields are within normal range.  RADIOLOGY RESULTS: mammograms reviewed and compatible with the above-stated findings  PLAN: present time patient is doing well with no evidence of disease. I have asked to see her back in 1 year for follow-up and will discontinue follow-up care. She or he has follow-up mammograms for next month which I will review when available. Otherwise I'm please were overall progress.  I would like to take this opportunity to thank you for allowing me to participate in the care of your patient.Noreene Filbert, MD

## 2019-01-05 ENCOUNTER — Telehealth: Payer: Self-pay

## 2019-01-05 NOTE — Telephone Encounter (Signed)
Kaylee Benson from [facility] called to check on the status of a release of information fax that she faxed. Please call Kaylee Benson back at [phone] with an update.

## 2019-01-09 NOTE — Telephone Encounter (Signed)
Spoke with Levada Dy and she is resending request. Thanks TNP

## 2019-01-10 ENCOUNTER — Telehealth: Payer: Self-pay | Admitting: Family Medicine

## 2019-01-10 ENCOUNTER — Encounter: Payer: Self-pay | Admitting: Family Medicine

## 2019-01-10 ENCOUNTER — Ambulatory Visit: Payer: BLUE CROSS/BLUE SHIELD | Admitting: Family Medicine

## 2019-01-10 VITALS — BP 108/60 | HR 88 | Temp 99.0°F | Resp 16 | Wt 133.4 lb

## 2019-01-10 DIAGNOSIS — B349 Viral infection, unspecified: Secondary | ICD-10-CM

## 2019-01-10 MED ORDER — HYDROCODONE-HOMATROPINE 5-1.5 MG/5ML PO SYRP: ORAL_SOLUTION | ORAL | 0 refills | Status: DC

## 2019-01-10 MED ORDER — OSELTAMIVIR PHOSPHATE 75 MG PO CAPS: 75.0000 mg | ORAL_CAPSULE | Freq: Two times a day (BID) | ORAL | 0 refills | Status: DC

## 2019-01-10 NOTE — Patient Instructions (Signed)
Discussed use Mucinex DM for cough. Let us know if not improving over the next few days.

## 2019-01-10 NOTE — Telephone Encounter (Signed)
Pt returned called. Pt stated she does want the CT report from Dec 2017 sent to [facility]. Result report faxed to their office via [facility]. Thanks TNP

## 2019-01-10 NOTE — Telephone Encounter (Signed)
LMTCB. We received ROI from [facility]. Need to verify pt is wanting her CT results from December 2017 sent to their office. Thanks TNP

## 2019-01-10 NOTE — Progress Notes (Signed)
  Subjective:     Patient ID: Kaylee Benson, female   DOB: December 07, 1970, 48 y.o.   MRN: [id number] Chief Complaint  Patient presents with  . Cough    Pt reports to the office today for flu symptoms. Pt reports she started experiencing body aches, congestion, cough with yellow sputum, chills, fever with a high 100.4 that started yesterday. Pt has taken OTC Advil with some relief.   HPI + flu shot. Has also taken Nyquil for her sx.  Review of Systems     Objective:   Physical Exam Constitutional:      General: She is not in acute distress.    Appearance: She is ill-appearing.  Neurological:     Mental Status: She is alert.   Ears: T.M's intact without inflammation Throat: no tonsillar enlargement or exudate Neck: no cervical adenopathy Lungs: clear     Assessment:    1. Acute viral syndrome; c/w influenza - oseltamivir (TAMIFLU) 75 MG capsule; Take 1 capsule (75 mg total) by mouth 2 (two) times daily.  Dispense: 10 capsule; Refill: 0 - HYDROcodone-homatropine (HYCODAN) 5-1.5 MG/5ML syrup; 5 ml 4-6 hours as needed for cough  Dispense: 100 mL; Refill: 0    Plan:    Discussed use of Mucinex DM.

## 2019-01-11 ENCOUNTER — Other Ambulatory Visit: Payer: BLUE CROSS/BLUE SHIELD

## 2019-02-01 ENCOUNTER — Other Ambulatory Visit: Payer: Self-pay

## 2019-02-01 ENCOUNTER — Ambulatory Visit
Admission: RE | Admit: 2019-02-01 | Discharge: 2019-02-01 | Disposition: A | Discharge status: Home / Self Care | Payer: PRIVATE HEALTH INSURANCE | Source: Ambulatory Visit | Attending: Oncology | Admitting: Oncology

## 2019-02-01 DIAGNOSIS — D0511 Intraductal carcinoma in situ of right breast: Secondary | ICD-10-CM

## 2019-02-01 HISTORY — DX: Personal history of irradiation: Z92.3

## 2019-02-15 ENCOUNTER — Encounter: Payer: Self-pay | Admitting: Family Medicine

## 2019-04-10 ENCOUNTER — Encounter: Payer: Self-pay | Admitting: Family Medicine

## 2019-04-26 ENCOUNTER — Encounter: Payer: Self-pay | Admitting: Family Medicine

## 2019-05-04 ENCOUNTER — Inpatient Hospital Stay: Payer: PRIVATE HEALTH INSURANCE | Attending: Oncology | Admitting: Oncology

## 2019-05-04 ENCOUNTER — Encounter: Payer: Self-pay | Admitting: Oncology

## 2019-05-04 ENCOUNTER — Encounter: Payer: Self-pay | Admitting: Family Medicine

## 2019-05-04 ENCOUNTER — Other Ambulatory Visit: Payer: Self-pay

## 2019-05-04 ENCOUNTER — Ambulatory Visit (INDEPENDENT_AMBULATORY_CARE_PROVIDER_SITE_OTHER): Payer: PRIVATE HEALTH INSURANCE | Admitting: Family Medicine

## 2019-05-04 VITALS — BP 118/68 | HR 88 | Temp 98.8°F | Ht 60.0 in | Wt 128.6 lb

## 2019-05-04 VITALS — BP 124/77 | HR 91 | Temp 98.4°F | Resp 18 | Ht 60.0 in | Wt 128.2 lb

## 2019-05-04 DIAGNOSIS — Z9049 Acquired absence of other specified parts of digestive tract: Secondary | ICD-10-CM

## 2019-05-04 DIAGNOSIS — Z170 Estrogen receptor positive status [ER+]: Secondary | ICD-10-CM | POA: Diagnosis not present

## 2019-05-04 DIAGNOSIS — Z803 Family history of malignant neoplasm of breast: Secondary | ICD-10-CM | POA: Diagnosis not present

## 2019-05-04 DIAGNOSIS — Z87442 Personal history of urinary calculi: Secondary | ICD-10-CM | POA: Diagnosis not present

## 2019-05-04 DIAGNOSIS — Z08 Encounter for follow-up examination after completed treatment for malignant neoplasm: Secondary | ICD-10-CM

## 2019-05-04 DIAGNOSIS — Z8781 Personal history of (healed) traumatic fracture: Secondary | ICD-10-CM

## 2019-05-04 DIAGNOSIS — Z79899 Other long term (current) drug therapy: Secondary | ICD-10-CM | POA: Insufficient documentation

## 2019-05-04 DIAGNOSIS — Z0000 Encounter for general adult medical examination without abnormal findings: Secondary | ICD-10-CM | POA: Diagnosis not present

## 2019-05-04 DIAGNOSIS — Z853 Personal history of malignant neoplasm of breast: Secondary | ICD-10-CM | POA: Insufficient documentation

## 2019-05-04 DIAGNOSIS — Z923 Personal history of irradiation: Secondary | ICD-10-CM | POA: Insufficient documentation

## 2019-05-04 DIAGNOSIS — D0511 Intraductal carcinoma in situ of right breast: Secondary | ICD-10-CM

## 2019-05-04 NOTE — Progress Notes (Signed)
Pt has a mole come up on other breast and it grew pretty quickly-she saw md and they took it off her breast. She does feel that she has arthritis in her hands and saw pcp today and was told that pt. Should take aleve twice a day and see how it goes

## 2019-05-04 NOTE — Progress Notes (Signed)
Patient: Kaylee Benson, Female    DOB: [birth year], 48 y.o.   MRN: [id number] Visit Date: 05/04/2019  Today's Provider: Wilhemena Durie, MD   Chief Complaint  Patient presents with  . Annual Exam   Subjective:  I, Kaylee Benson CMA, am acting as a scribe for Wilhemena Durie, MD.    Annual physical exam PEYTIN Benson is a 49 y.o. female who presents today for health maintenance and complete physical. She feels fairly well. She reports exercising walking. She reports she is sleeping fairly well.  ----------------------------------------------------------------- Last mammogram:02/01/2019 Last pap smear:10/2018 per pt  Review of Systems  Constitutional: Negative.   HENT: Negative.   Eyes: Negative.   Respiratory: Negative.   Cardiovascular: Negative.   Gastrointestinal: Negative.   Endocrine: Negative.   Genitourinary: Negative.   Musculoskeletal: Positive for arthralgias, back pain, neck pain and neck stiffness.  Skin: Negative.   Allergic/Immunologic: Negative.   Neurological: Negative.   Hematological: Negative.   Psychiatric/Behavioral: Negative.   Pt had Assure procedure and is worried this could cause bone issues or other side effects. Wants to get BMD  She did have an unexpected Tib-fib fracture several years ago.  Social History She  reports that she has never smoked. She has never used smokeless tobacco. She reports current alcohol use. She reports that she does not use drugs. Social History   Socioeconomic History  . Marital status: Married    Spouse name: Not on file  . Number of children: Not on file  . Years of education: Not on file  . Highest education level: Not on file  Occupational History  . Not on file  Social Needs  . Financial resource strain: Not on file  . Food insecurity    Worry: Not on file    Inability: Not on file  . Transportation needs    Medical: Not on file    Non-medical: Not on file  Tobacco Use  . Smoking  status: Never Smoker  . Smokeless tobacco: Never Used  Substance and Sexual Activity  . Alcohol use: Yes    Comment: Drinks glass of wine 1-2 a month  . Drug use: No  . Sexual activity: Yes    Birth control/protection: Other-see comments    Comment: Essure procedure done 1 year ago, menarche age 67, G53, P 12, maternal age 71-25  Lifestyle  . Physical activity    Days per week: Not on file    Minutes per session: Not on file  . Stress: Not on file  Relationships  . Social Herbalist on phone: Not on file    Gets together: Not on file    Attends religious service: Not on file    Active member of club or organization: Not on file    Attends meetings of clubs or organizations: Not on file    Relationship status: Not on file  Other Topics Concern  . Not on file  Social History Narrative  . Not on file    Patient Active Problem List   Diagnosis Date Noted  . Status post laparoscopic appendectomy 11/09/2016  . Abnormal finding on mammography 09/04/2015  . Personal history of urinary calculi 09/04/2015  . Depression 09/04/2015  . 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 Allergic rhinitis 02/16/2008    Past Surgical History:  Procedure Laterality Date  . AUGMENTATION MAMMAPLASTY    . BREAST BIOPSY Right 2016   LOW GRADE DUCTAL CARCINOMA  IN SITU, 0.45 CM IN GREATEST DIMENSION  . BREAST CAPSULECTOMY WITH IMPLANT EXCHANGE Right 01/30/2015   Procedure: BREAST CAPSULECTOMY WITH IMPLANT EXCHANGE;  Surgeon: Bea Graff, MD;  Location: [place];  Service: Plastics;  Laterality: Right;  . BREAST ENHANCEMENT SURGERY Bilateral   . BREAST LUMPECTOMY Right 2016   LOW GRADE DUCTAL CARCINOMA IN SITU, 0.45 CM IN GREATEST DIMENSION  . BREAST LUMPECTOMY WITH NEEDLE LOCALIZATION Right 01/30/2015   Procedure: BREAST LUMPECTOMY WITH NEEDLE LOCALIZATION;  Surgeon: Erroll Luna, MD;  Location: [place];  Service: General;  Laterality: Right;  . BREAST  SURGERY     bilateral breast implants 2001  . FOOT SURGERY Left   . LAPAROSCOPIC APPENDECTOMY N/A 11/02/2016   Procedure: APPENDECTOMY LAPAROSCOPIC;  Surgeon: Dia Crawford III, MD;  Location: [facility];  Service: General;  Laterality: N/A;  . MOUTH SURGERY    . ORIF ANKLE FRACTURE     left  . Surgery on left lower leg     2007    Family History  Family Status  Relation Name Status  . Mother  (Not Specified)   Her family history includes Breast cancer (age of onset: 60) in her mother; Heart disease in her mother; Hypertension in her mother.     No Known Allergies  Previous Medications   BIOTIN PO    Take 1 tablet by mouth. Reported on 04/22/2016   CALCIUM CARBONATE-VITAMIN D (CALCIUM + D PO)    Take by mouth once.   CYCLOBENZAPRINE (FLEXERIL) 5 MG TABLET    Take 1 tablet (5 mg total) by mouth 3 (three) times daily as needed for muscle spasms.   FERROUS SULFATE 90 (18 FE) MG TABS    Take by mouth.   FLUTICASONE (FLONASE) 50 MCG/ACT NASAL SPRAY    Place 2 sprays into both nostrils daily.   HYDROCODONE-HOMATROPINE (HYCODAN) 5-1.5 MG/5ML SYRUP    5 ml 4-6 hours as needed for cough   MULTIPLE VITAMIN (MULTIVITAMIN) TABLET    Take 1 tablet by mouth daily.   NAPROXEN SODIUM (ANAPROX) 220 MG TABLET    Take 220 mg by mouth as needed.   OMEGA-3 FATTY ACIDS (FISH OIL BURP-LESS) 1000 MG CAPS    Take 2 capsules by mouth daily.   OSELTAMIVIR (TAMIFLU) 75 MG CAPSULE    Take 1 capsule (75 mg total) by mouth 2 (two) times daily.   TURMERIC 500 MG TABS    Take 2 capsules by mouth daily.    Patient Care Team: Jerrol Banana., MD as PCP - General (Family Medicine)      Objective:   Vitals: BP 118/68 (BP Location: Right Arm, Patient Position: Sitting, Cuff Size: Normal)   Pulse 88   Temp 98.8 F (37.1 C) (Oral)   Ht 5' (1.524 m)   Wt 128 lb 9.6 oz (58.3 kg)   SpO2 97%   BMI 25.12 kg/m    Physical Exam Vitals signs reviewed.  Constitutional:      Appearance: She is well-developed.   HENT:     Head: Normocephalic and atraumatic.     Right Ear: External ear normal.     Left Ear: External ear normal.     Nose: Nose normal.  Eyes:     Conjunctiva/sclera: Conjunctivae normal.     Pupils: Pupils are equal, round, and reactive to light.  Neck:     Musculoskeletal: Normal range of motion and neck supple.  Cardiovascular:     Rate and Rhythm:  Normal rate and regular rhythm.     Heart sounds: Normal heart sounds.  Pulmonary:     Effort: Pulmonary effort is normal.     Breath sounds: Normal breath sounds.  Abdominal:     General: Bowel sounds are normal.     Palpations: Abdomen is soft.  Musculoskeletal: Normal range of motion.  Skin:    General: Skin is warm and dry.  Neurological:     General: No focal deficit present.     Mental Status: She is alert and oriented to person, place, and time. Mental status is at baseline.     Deep Tendon Reflexes: Reflexes are normal and symmetric.  Psychiatric:        Mood and Affect: Mood normal.        Behavior: Behavior normal.        Thought Content: Thought content normal.        Judgment: Judgment normal.      Depression Screen PHQ 2/9 Scores 12/01/2017 10/08/2016 11/27/2015 09/04/2015  PHQ - 2 Score 0 0 0 0      Assessment & Plan:     Routine Health Maintenance and Physical Exam  Exercise Activities and Dietary recommendations Goals   None     Immunization History  Administered Date(s) Administered  . Influenza,inj,Quad PF,6+ Mos 10/12/2018    Health Maintenance  Topic Date Due  . TETANUS/TDAP  10/07/1990  . PAP SMEAR-Modifier  10/07/1992  . INFLUENZA VACCINE  06/24/2019  . HIV Screening  Completed     Discussed health benefits of physical activity, and encouraged her to engage in regular exercise appropriate for her age and condition.  1. Encounter for annual physical examination excluding gynecological examination in a patient older than 17 years Sees Gyn. Update Tetanus. - CBC with  Differential/Platelet - Comprehensive metabolic panel - Lipid panel - TSH  2. History of fracture  - VITAMIN D 25 Hydroxy (Vit-D Deficiency, Fractures) - DG Bone Density; Future  3. Status post laparoscopic appendectomy   4. Personal history of urinary calculi   5. Ductal carcinoma in situ (DCIS) of right breast Per oncology.    -------------------------------------------------------------------- 1. Encounter for annual physical examination excluding gynecological examination in a patient older than 17 years Overall good health. - CBC with Differential/Platelet - Comprehensive metabolic panel - Lipid panel - TSH  2. History of fracture  - VITAMIN D 25 Hydroxy (Vit-D Deficiency, Fractures) - DG Bone Density; Future  3. Status post laparoscopic appendectomy   4. Personal history of urinary calculi   5. Ductal carcinoma in situ (DCIS) of right breast

## 2019-05-04 NOTE — Progress Notes (Signed)
Hematology/Oncology Consult note [facility]  [phone] [phone]  Patient Care Team: Jerrol Banana., MD as PCP - General (Family Medicine)   Name of the patient: Kaylee Benson  [id number]  01-21-1971   Date of visit: 05/04/19  Diagnosis-right breast DCIS  Chief complaint/ Reason for visit-routine follow-up of right breast DCIS  Heme/Onc history: Patient is a 48 year old female diagnosed with right breast dcis in 2016 s/p lumpectomy. She was started on tamoxifen after that but developed vaginal bleeding and chest pain following that. CT chest was negative for PE. She was also seen by Gyn and there was no evidence of endometrial cancer. She has remained off tamoxifen since then.Family history of breast cancer in her mother. She did undergo genetic testing and was negative.She also has b/l breast implants  Interval history-overall patient is doing well and she denies any specific complaints at this time.  Her appetite is good and she denies any unintentional weight loss or lumps or bumps anywhere.  ECOG PS- 0 Pain scale- 0  Review of systems- Review of Systems  Constitutional: Negative for chills, fever, malaise/fatigue and weight loss.  HENT: Negative for congestion, ear discharge and nosebleeds.   Eyes: Negative for blurred vision.  Respiratory: Negative for cough, hemoptysis, sputum production, shortness of breath and wheezing.   Cardiovascular: Negative for chest pain, palpitations, orthopnea and claudication.  Gastrointestinal: Negative for abdominal pain, blood in stool, constipation, diarrhea, heartburn, melena, nausea and vomiting.  Genitourinary: Negative for dysuria, flank pain, frequency, hematuria and urgency.  Musculoskeletal: Negative for back pain, joint pain and myalgias.  Skin: Negative for rash.  Neurological: Negative for dizziness, tingling, focal weakness, seizures, weakness and headaches.   Endo/Heme/Allergies: Does not bruise/bleed easily.  Psychiatric/Behavioral: Negative for depression and suicidal ideas. The patient does not have insomnia.        No Known Allergies   Past Medical History:  Diagnosis Date  . Allergy    seasonal  . Breast cancer ([place]) right   radiation  . 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 Kidney stone   . Kidney stones   . Personal history of radiation therapy 2016   RIGHT lumpectomy  . PONV (postoperative nausea and vomiting)    nausea  . Sclerosis of sacroiliac joint      Past Surgical History:  Procedure Laterality Date  . AUGMENTATION MAMMAPLASTY    . BREAST BIOPSY Right 2016   LOW GRADE DUCTAL CARCINOMA IN SITU, 0.45 CM IN GREATEST DIMENSION  . BREAST CAPSULECTOMY WITH IMPLANT EXCHANGE Right 01/30/2015   Procedure: BREAST CAPSULECTOMY WITH IMPLANT EXCHANGE;  Surgeon: Bea Graff, MD;  Location: [place];  Service: Plastics;  Laterality: Right;  . BREAST ENHANCEMENT SURGERY Bilateral   . BREAST LUMPECTOMY Right 2016   LOW GRADE DUCTAL CARCINOMA IN SITU, 0.45 CM IN GREATEST DIMENSION  . BREAST LUMPECTOMY WITH NEEDLE LOCALIZATION Right 01/30/2015   Procedure: BREAST LUMPECTOMY WITH NEEDLE LOCALIZATION;  Surgeon: Erroll Luna, MD;  Location: [facility];  Service: General;  Laterality: Right;  . BREAST SURGERY     bilateral breast implants 2001  . FOOT SURGERY Left   . LAPAROSCOPIC APPENDECTOMY N/A 11/02/2016   Procedure: APPENDECTOMY LAPAROSCOPIC;  Surgeon: Dia Crawford III, MD;  Location: [facility];  Service: General;  Laterality: N/A;  . MOUTH SURGERY    . ORIF ANKLE FRACTURE     left  . Surgery on left lower leg  2007    Social History   Socioeconomic History  . Marital status: Married    Spouse name: Not on file  . Number of children: Not on file  . Years of education: Not on file  . Highest education level: Not on file  Occupational History  . Not on file  Social Needs  . Financial  resource strain: Not on file  . Food insecurity    Worry: Not on file    Inability: Not on file  . Transportation needs    Medical: Not on file    Non-medical: Not on file  Tobacco Use  . Smoking status: Never Smoker  . Smokeless tobacco: Never Used  Substance and Sexual Activity  . Alcohol use: Yes    Comment: Drinks glass of wine 1-2 a month  . Drug use: No  . Sexual activity: Yes    Birth control/protection: Other-see comments    Comment: Essure procedure done 1 year ago, menarche age 64, G35, P 76, maternal age 54-25  Lifestyle  . Physical activity    Days per week: Not on file    Minutes per session: Not on file  . Stress: Not on file  Relationships  . Social Herbalist on phone: Not on file    Gets together: Not on file    Attends religious service: Not on file    Active member of club or organization: Not on file    Attends meetings of clubs or organizations: Not on file    Relationship status: Not on file  . Intimate partner violence    Fear of current or ex partner: Not on file    Emotionally abused: Not on file    Physically abused: Not on file    Forced sexual activity: Not on file  Other Topics Concern  . Not on file  Social History Narrative  . Not on file    Family History  Problem Relation Age of Onset  . Breast cancer Mother 4  . Heart disease Mother   . Hypertension Mother      Current Outpatient Medications:  .  BIOTIN PO, Take 1 tablet by mouth. Reported on 04/22/2016, Disp: , Rfl:  .  Calcium Carbonate-Vitamin D (CALCIUM + D PO), Take by mouth once., Disp: , Rfl:  .  fluticasone (FLONASE) 50 MCG/ACT nasal spray, Place 2 sprays into both nostrils daily., Disp: 16 g, Rfl: 12 .  Misc Natural Products (OSTEO BI-FLEX ADV JOINT SHIELD) TABS, Take 1 tablet by mouth daily., Disp: , Rfl:  .  Multiple Vitamin (MULTIVITAMIN) tablet, Take 1 tablet by mouth daily., Disp: , Rfl:  .  naproxen sodium (ANAPROX) 220 MG tablet, Take 220 mg by mouth as  needed., Disp: , Rfl:  .  Omega-3 Fatty Acids (FISH OIL BURP-LESS) 1000 MG CAPS, Take 2 capsules by mouth daily., Disp: , Rfl:  .  Turmeric 500 MG TABS, Take 1 capsule by mouth daily. , Disp: , Rfl:   Physical exam:  Vitals:   05/04/19 1431  BP: 124/77  Pulse: 91  Resp: 18  Temp: 98.4 F (36.9 C)  TempSrc: Tympanic  Weight: 128 lb 3.2 oz (58.2 kg)  Height: 5' (1.524 m)   Physical Exam Constitutional:      General: She is not in acute distress. HENT:     Head: Normocephalic and atraumatic.  Eyes:     Pupils: Pupils are equal, round, and reactive to light.  Neck:  Musculoskeletal: Normal range of motion.  Cardiovascular:     Rate and Rhythm: Normal rate and regular rhythm.     Heart sounds: Normal heart sounds.  Pulmonary:     Effort: Pulmonary effort is normal.     Breath sounds: Normal breath sounds.  Abdominal:     General: Bowel sounds are normal.     Palpations: Abdomen is soft.  Skin:    General: Skin is warm and dry.  Neurological:     Mental Status: She is alert and oriented to person, place, and time.   Bilateral breast implants in place.  Patient is status post right lumpectomy with a well-healed surgical scar.  No palpable bilateral breast masses or no palpable bilateral axillary adenopathy  CMP Latest Ref Rng & Units 02/09/2018  Glucose 65 - 99 mg/dL 82  BUN 6 - 24 mg/dL 17  Creatinine 0.57 - 1.00 mg/dL 0.81  Sodium 134 - 144 mmol/L 139  Potassium 3.5 - 5.2 mmol/L 4.1  Chloride 96 - 106 mmol/L 101  CO2 20 - 29 mmol/L 24  Calcium 8.7 - 10.2 mg/dL 9.2  Total Protein 6.0 - 8.5 g/dL 6.4  Total Bilirubin 0.0 - 1.2 mg/dL 0.2  Alkaline Phos 39 - 117 IU/L 48  AST 0 - 40 IU/L 19  ALT 0 - 32 IU/L 13   CBC Latest Ref Rng & Units 02/09/2018  WBC 3.4 - 10.8 x10E3/uL 4.3  Hemoglobin 11.1 - 15.9 g/dL 11.4  Hematocrit 34.0 - 46.6 % 36.3  Platelets 150 - 379 x10E3/uL 306     Assessment and plan- Patient is a 48 y.o. female with history of right breast DCIS in  2016 status post lumpectomy.  She is off endocrine therapy as she could not tolerate tamoxifen and is currently on surveillance Clinically patient is doing well and there are no concerns of recurrence on today's exam.  Her mammogram from March 2020 did not reveal any malignancy.  Patient can continue to follow-up with Dr. Rosanna Randy at this time and she can continue to get yearly breast exams as well as mammograms with him.  She can be referred to [place] in the future if questions or concerns arise.   Visit Diagnosis 1. Encounter for follow-up surveillance of breast cancer      Dr. Randa Evens, MD, MPH [facility] [phone] 05/04/2019 2:53 PM

## 2019-05-05 LAB — LIPID PANEL

## 2019-05-06 LAB — COMPREHENSIVE METABOLIC PANEL
ALT: 17 IU/L (ref 0–32)
AST: 20 IU/L (ref 0–40)
Albumin/Globulin Ratio: 1.9 (ref 1.2–2.2)
Albumin: 4.7 g/dL (ref 3.8–4.8)
Alkaline Phosphatase: 47 IU/L (ref 39–117)
BUN/Creatinine Ratio: 28 — ABNORMAL HIGH (ref 9–23)
BUN: 23 mg/dL (ref 6–24)
Bilirubin Total: 0.4 mg/dL (ref 0.0–1.2)
CO2: 20 mmol/L (ref 20–29)
Calcium: 9.8 mg/dL (ref 8.7–10.2)
Chloride: 101 mmol/L (ref 96–106)
Creatinine, Ser: 0.83 mg/dL (ref 0.57–1.00)
GFR calc Af Amer: 97 mL/min/{1.73_m2} (ref >59)
GFR calc non Af Amer: 84 mL/min/{1.73_m2} (ref >59)
Globulin, Total: 2.5 g/dL (ref 1.5–4.5)
Glucose: 84 mg/dL (ref 65–99)
Potassium: 4.4 mmol/L (ref 3.5–5.2)
Sodium: 137 mmol/L (ref 134–144)
Total Protein: 7.2 g/dL (ref 6.0–8.5)

## 2019-05-06 LAB — CBC WITH DIFFERENTIAL/PLATELET
Basophils Absolute: 0 10*3/uL (ref 0.0–0.2)
Basos: 1 %
EOS (ABSOLUTE): 0.1 10*3/uL (ref 0.0–0.4)
Eos: 3 %
Hematocrit: 41.8 % (ref 34.0–46.6)
Hemoglobin: 13.1 g/dL (ref 11.1–15.9)
Immature Grans (Abs): 0 10*3/uL (ref 0.0–0.1)
Immature Granulocytes: 0 %
Lymphocytes Absolute: 1 10*3/uL (ref 0.7–3.1)
Lymphs: 23 %
MCH: 25.9 pg — ABNORMAL LOW (ref 26.6–33.0)
MCHC: 31.3 g/dL — ABNORMAL LOW (ref 31.5–35.7)
MCV: 83 fL (ref 79–97)
Monocytes Absolute: 0.5 10*3/uL (ref 0.1–0.9)
Monocytes: 11 %
Neutrophils Absolute: 2.9 10*3/uL (ref 1.4–7.0)
Neutrophils: 62 %
Platelets: 302 10*3/uL (ref 150–450)
RBC: 5.06 x10E6/uL (ref 3.77–5.28)
RDW: 15.7 % — ABNORMAL HIGH (ref 11.7–15.4)
WBC: 4.5 10*3/uL (ref 3.4–10.8)

## 2019-05-06 LAB — LIPID PANEL
Chol/HDL Ratio: 2.9 ratio (ref 0.0–4.4)
Cholesterol, Total: 241 mg/dL — ABNORMAL HIGH (ref 100–199)
HDL: 84 mg/dL (ref >39)
LDL Calculated: 138 mg/dL — ABNORMAL HIGH (ref 0–99)
Triglycerides: 96 mg/dL (ref 0–149)
VLDL Cholesterol Cal: 19 mg/dL (ref 5–40)

## 2019-05-06 LAB — VITAMIN D 25 HYDROXY (VIT D DEFICIENCY, FRACTURES): Vit D, 25-Hydroxy: 51.9 ng/mL (ref 30.0–100.0)

## 2019-05-06 LAB — TSH: TSH: 1.05 u[IU]/mL (ref 0.450–4.500)

## 2019-05-08 ENCOUNTER — Telehealth: Payer: Self-pay

## 2019-05-08 NOTE — Telephone Encounter (Signed)
Patient advised.

## 2019-05-08 NOTE — Telephone Encounter (Signed)
[id number] 

## 2019-05-08 NOTE — Telephone Encounter (Signed)
-----   Message from Jerrol Banana., MD sent at 05/08/2019  8:19 AM EDT ----- Labs stable.  Vitamin D normal.

## 2019-06-12 ENCOUNTER — Encounter: Payer: Self-pay | Admitting: Family Medicine

## 2019-06-12 ENCOUNTER — Other Ambulatory Visit: Payer: Self-pay

## 2019-06-12 ENCOUNTER — Ambulatory Visit (INDEPENDENT_AMBULATORY_CARE_PROVIDER_SITE_OTHER): Payer: PRIVATE HEALTH INSURANCE | Admitting: Family Medicine

## 2019-06-12 VITALS — BP 112/70 | HR 96 | Temp 98.9°F | Resp 16 | Wt 132.0 lb

## 2019-06-12 DIAGNOSIS — S7011XA Contusion of right thigh, initial encounter: Secondary | ICD-10-CM | POA: Diagnosis not present

## 2019-06-12 DIAGNOSIS — S8011XA Contusion of right lower leg, initial encounter: Secondary | ICD-10-CM

## 2019-06-12 NOTE — Progress Notes (Signed)
Patient: Kaylee Benson Female    DOB: Dec 06, 1970   48 y.o.   MRN: [id number] Visit Date: 06/12/2019  Today's Provider: Wilhemena Durie, MD   Chief Complaint  Patient presents with  . Fall  . hematoma   Subjective:    Fall The accident occurred 3 to 5 days ago (3 days ago). The fall occurred while swimming/diving (patient was on their boat, slipped on a slick surface). She landed on hard floor. There was no blood loss. The point of impact was the buttocks. The pain is present in the buttocks. The pain is at a severity of 7/10. The pain is moderate (can be severe). The symptoms are aggravated by sitting and pressure on injury. Associated symptoms include numbness and tingling.  Patient was attempting to jump from a boat to the dock a couple of days ago when she missed landing on her right lower buttocks and upper posterior thigh.  There is a large bruising and some hematoma here now.  No neurovascular symptoms.  She is stiffbut able to move and ambulate.  No Known Allergies   Current Outpatient Medications:  .  BIOTIN PO, Take 1 tablet by mouth. Reported on 04/22/2016, Disp: , Rfl:  .  Calcium Carbonate-Vitamin D (CALCIUM + D PO), Take by mouth once., Disp: , Rfl:  .  fluticasone (FLONASE) 50 MCG/ACT nasal spray, Place 2 sprays into both nostrils daily., Disp: 16 g, Rfl: 12 .  Misc Natural Products (OSTEO BI-FLEX ADV JOINT SHIELD) TABS, Take 1 tablet by mouth daily., Disp: , Rfl:  .  Multiple Vitamin (MULTIVITAMIN) tablet, Take 1 tablet by mouth daily., Disp: , Rfl:  .  naproxen sodium (ANAPROX) 220 MG tablet, Take 220 mg by mouth as needed., Disp: , Rfl:  .  Omega-3 Fatty Acids (FISH OIL BURP-LESS) 1000 MG CAPS, Take 2 capsules by mouth daily., Disp: , Rfl:  .  Turmeric 500 MG TABS, Take 1 capsule by mouth daily. , Disp: , Rfl:   Review of Systems  Constitutional: Negative.   Musculoskeletal: Positive for myalgias.  Neurological: Positive for tingling and numbness.   Hematological: Negative.   Psychiatric/Behavioral: Negative.     Social History   Tobacco Use  . Smoking status: Never Smoker  . Smokeless tobacco: Never Used  Substance Use Topics  . Alcohol use: Yes    Comment: Drinks glass of wine 1-2 a month      Objective:   BP 112/70   Pulse 96   Temp 98.9 F (37.2 C)   Resp 16   Wt 132 lb (59.9 kg)   SpO2 99%   BMI 25.78 kg/m  Vitals:   06/12/19 1353  BP: 112/70  Pulse: 96  Resp: 16  Temp: 98.9 F (37.2 C)  SpO2: 99%  Weight: 132 lb (59.9 kg)     Physical Exam Vitals signs reviewed.  Constitutional:      Appearance: Normal appearance.  HENT:     Head: Normocephalic and atraumatic.     Right Ear: External ear normal.     Left Ear: External ear normal.     Nose: Nose normal.  Eyes:     General: No scleral icterus. Cardiovascular:     Pulses: Normal pulses.  Pulmonary:     Effort: Pulmonary effort is normal.  Skin:    General: Skin is warm and dry.     Findings: Bruising present.     Comments: She has a large bruise covering her  lower right buttocks and the upper third of her posterior thigh.  There is a baseball sized hematoma in the area in the middle of this. It is firm,not fluctuant.  Neurological:     Mental Status: She is alert.  Psychiatric:        Mood and Affect: Mood normal.        Behavior: Behavior normal.        Thought Content: Thought content normal.        Judgment: Judgment normal.      No results found for any visits on 06/12/19.     Assessment & Plan    1. Contusion of right thigh, initial encounter   2. Hematoma of lower extremity, right, initial encounter Pt to use ice today then start using frequent heat tomorrow. Tylenol for pain.     Jabes Primo Cran[place] Mon, MD  [facility]

## 2019-07-11 ENCOUNTER — Ambulatory Visit
Admission: RE | Admit: 2019-07-11 | Discharge: 2019-07-11 | Disposition: A | Discharge status: Home / Self Care | Payer: PRIVATE HEALTH INSURANCE | Source: Ambulatory Visit | Attending: Family Medicine | Admitting: Family Medicine

## 2019-07-11 DIAGNOSIS — Z8781 Personal history of (healed) traumatic fracture: Secondary | ICD-10-CM | POA: Diagnosis not present

## 2019-07-12 ENCOUNTER — Telehealth: Payer: Self-pay

## 2019-07-12 NOTE — Telephone Encounter (Signed)
Patient notified of results. She is concerned with her pain since the test did not show any abnormalities and asked if a referral would be needed for further evaluation. Please advise

## 2019-07-12 NOTE — Telephone Encounter (Signed)
-----   Message from Jerrol Banana., MD sent at 07/12/2019  1:04 PM EDT ----- Normal BMD.

## 2019-07-13 NOTE — Telephone Encounter (Signed)
I am unclear about the pain.? OK to refer to ortho is she wishes or we could see her.

## 2019-07-13 NOTE — Telephone Encounter (Signed)
ok 

## 2019-07-13 NOTE — Telephone Encounter (Signed)
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 She will check with her insurance company to see which orthopedist is in network and call me back to do the referral.

## 2019-07-17 ENCOUNTER — Other Ambulatory Visit: Payer: Self-pay

## 2019-07-17 DIAGNOSIS — M533 Sacrococcygeal disorders, not elsewhere classified: Secondary | ICD-10-CM

## 2019-07-17 NOTE — Progress Notes (Signed)
Order placed per Dr. Gilbert.  

## 2019-12-25 ENCOUNTER — Ambulatory Visit
Admission: RE | Admit: 2019-12-25 | Payer: BC Managed Care – PPO | Source: Ambulatory Visit | Admitting: Radiation Oncology

## 2020-01-15 DIAGNOSIS — G20 Parkinson's disease: Secondary | ICD-10-CM | POA: Diagnosis not present

## 2020-01-15 DIAGNOSIS — R739 Hyperglycemia, unspecified: Secondary | ICD-10-CM | POA: Diagnosis not present

## 2020-01-15 DIAGNOSIS — I700 Atherosclerosis of aorta: Secondary | ICD-10-CM | POA: Diagnosis not present

## 2020-01-15 DIAGNOSIS — G2581 Restless legs syndrome: Secondary | ICD-10-CM | POA: Diagnosis not present

## 2020-01-15 DIAGNOSIS — E785 Hyperlipidemia, unspecified: Secondary | ICD-10-CM | POA: Diagnosis not present

## 2020-01-15 DIAGNOSIS — E349 Endocrine disorder, unspecified: Secondary | ICD-10-CM | POA: Diagnosis not present

## 2020-02-28 DIAGNOSIS — Z01419 Encounter for gynecological examination (general) (routine) without abnormal findings: Secondary | ICD-10-CM | POA: Diagnosis not present

## 2020-02-28 DIAGNOSIS — Z6825 Body mass index (BMI) 25.0-25.9, adult: Secondary | ICD-10-CM | POA: Diagnosis not present

## 2020-02-29 DIAGNOSIS — R739 Hyperglycemia, unspecified: Secondary | ICD-10-CM | POA: Diagnosis not present

## 2020-02-29 DIAGNOSIS — E349 Endocrine disorder, unspecified: Secondary | ICD-10-CM | POA: Diagnosis not present

## 2020-02-29 DIAGNOSIS — G20 Parkinson's disease: Secondary | ICD-10-CM | POA: Diagnosis not present

## 2020-02-29 DIAGNOSIS — I700 Atherosclerosis of aorta: Secondary | ICD-10-CM | POA: Diagnosis not present

## 2020-03-04 DIAGNOSIS — G20 Parkinson's disease: Secondary | ICD-10-CM | POA: Diagnosis not present

## 2020-03-12 DIAGNOSIS — G4733 Obstructive sleep apnea (adult) (pediatric): Secondary | ICD-10-CM | POA: Diagnosis not present

## 2020-03-17 DIAGNOSIS — G4733 Obstructive sleep apnea (adult) (pediatric): Secondary | ICD-10-CM | POA: Diagnosis not present

## 2020-05-03 NOTE — Progress Notes (Signed)
Trena Platt Cummings,acting as a scribe for Wilhemena Durie, MD.,have documented all relevant documentation on the behalf of Wilhemena Durie, MD,as directed by  Wilhemena Durie, MD while in the presence of Wilhemena Durie, MD.  Complete physical exam   Patient: Kaylee Benson   DOB: 1971-02-06   49 y.o. Female  MRN: [id number] Visit Date: 05/08/2020  Today's healthcare provider: Wilhemena Durie, MD   Chief Complaint  Patient presents with   Annual Exam   Subjective    Kaylee Benson is a 49 y.o. female who presents today for a complete physical exam.  She reports consuming a high protein, low carb diet. The patient does not participate in regular exercise at present. She generally feels well. She reports sleeping well. She does have additional problems to discuss today.   HPI  Patient would like to discuss low back pain.   Last pap: sees Dr. Matthew Saras Last mammogram: 02/01/2019 Past Medical History:  Diagnosis Date   Allergy    seasonal   Breast cancer ([place]) right   radiation   Carcinoma in situ of breast 04/15/2015   Kidney stone    Kidney stones    Personal history of radiation therapy 2016   RIGHT lumpectomy   PONV (postoperative nausea and vomiting)    nausea   Sclerosis of sacroiliac joint    Past Surgical History:  Procedure Laterality Date   AUGMENTATION MAMMAPLASTY     BREAST BIOPSY Right 2016   LOW GRADE DUCTAL CARCINOMA IN SITU, 0.45 CM IN GREATEST DIMENSION   BREAST CAPSULECTOMY WITH IMPLANT EXCHANGE Right 01/30/2015   Procedure: BREAST CAPSULECTOMY WITH IMPLANT EXCHANGE;  Surgeon: Bea Graff, MD;  Location: [facility];  Service: Plastics;  Laterality: Right;   BREAST ENHANCEMENT SURGERY Bilateral    BREAST LUMPECTOMY Right 2016   LOW GRADE DUCTAL CARCINOMA IN SITU, 0.45 CM IN GREATEST DIMENSION   BREAST LUMPECTOMY WITH NEEDLE LOCALIZATION Right 01/30/2015   Procedure: BREAST LUMPECTOMY WITH NEEDLE LOCALIZATION;   Surgeon: Erroll Luna, MD;  Location: [facility];  Service: General;  Laterality: Right;   BREAST SURGERY     bilateral breast implants 2001   FOOT SURGERY Left    LAPAROSCOPIC APPENDECTOMY N/A 11/02/2016   Procedure: APPENDECTOMY LAPAROSCOPIC;  Surgeon: Dia Crawford III, MD;  Location: [facility];  Service: General;  Laterality: N/A;   MOUTH SURGERY     ORIF ANKLE FRACTURE     left   Surgery on left lower leg     2007   Social History   Socioeconomic History   Marital status: Married    Spouse name: Not on file   Number of children: Not on file   Years of education: Not on file   Highest education level: Not on file  Occupational History   Not on file  Tobacco Use   Smoking status: Never Smoker   Smokeless tobacco: Never Used  Vaping Use   Vaping Use: Never used  Substance and Sexual Activity   Alcohol use: Yes    Comment: Drinks glass of wine 1-2 a month   Drug use: No   Sexual activity: Yes    Birth control/protection: Other-see comments    Comment: Essure procedure done 1 year ago, menarche age 62, G42, 28 21, maternal age 68-25  Other Topics Concern   Not on file  Social History Narrative   Not on file   Social Determinants of Radio broadcast assistant  Strain:    Difficulty of Paying Living Expenses:   Food Insecurity:    Worried About Charity fundraiser in the Last Year:    Arboriculturist in the Last Year:   Transportation Needs:    Film/video editor (Medical):    Lack of Transportation (Non-Medical):   Physical Activity:    Days of Exercise per Week:    Minutes of Exercise per Session:   Stress:    Feeling of Stress :   Social Connections:    Frequency of Communication with Friends and Family:    Frequency of Social Gatherings with Friends and Family:    Attends Religious Services:    Active Member of Clubs or Organizations:    Attends Music therapist:    Marital Status:   Intimate  Partner Violence:    Fear of Current or Ex-Partner:    Emotionally Abused:    Physically Abused:    Sexually Abused:    Family Status  Relation Name Status   Mother  (Not Specified)   Family History  Problem Relation Age of Onset   Breast cancer Mother 19   Heart disease Mother    Hypertension Mother    No Known Allergies  Patient Care Team: Jerrol Banana., MD as PCP - General (Family Medicine)   Medications: Outpatient Medications Prior to Visit  Medication Sig   BIOTIN PO Take 1 tablet by mouth. Reported on 04/22/2016   Calcium Carbonate-Vitamin D (CALCIUM + D PO) Take by mouth once.   fluticasone (FLONASE) 50 MCG/ACT nasal spray Place 2 sprays into both nostrils daily.   Misc Natural Products (OSTEO BI-FLEX ADV JOINT SHIELD) TABS Take 1 tablet by mouth daily.   Multiple Vitamin (MULTIVITAMIN) tablet Take 1 tablet by mouth daily.   naproxen sodium (ANAPROX) 220 MG tablet Take 220 mg by mouth as needed.   Omega-3 Fatty Acids (FISH OIL BURP-LESS) 1000 MG CAPS Take 2 capsules by mouth daily.   Turmeric 500 MG TABS Take 1 capsule by mouth daily.    No facility-administered medications prior to visit.    Review of Systems  Constitutional: Negative.   HENT: Negative.   Eyes: Negative.   Respiratory: Negative.   Cardiovascular: Negative.   Gastrointestinal: Negative.   Endocrine: Negative.   Genitourinary: Negative.   Musculoskeletal: Positive for arthralgias and back pain.       Pain in region of SI joint at times.  Skin: Negative.   Allergic/Immunologic: Negative.   Neurological: Negative.   Hematological: Negative.   Psychiatric/Behavioral: Negative.        Objective    There were no vitals taken for this visit.    Physical Exam Vitals reviewed.  Constitutional:      Appearance: She is well-developed.  HENT:     Head: Normocephalic and atraumatic.     Right Ear: External ear normal.     Left Ear: External ear normal.     Nose:  Nose normal.  Eyes:     Conjunctiva/sclera: Conjunctivae normal.     Pupils: Pupils are equal, round, and reactive to light.  Cardiovascular:     Rate and Rhythm: Normal rate and regular rhythm.     Heart sounds: Normal heart sounds.  Pulmonary:     Effort: Pulmonary effort is normal.     Breath sounds: Normal breath sounds.  Abdominal:     General: Bowel sounds are normal.     Palpations: Abdomen is soft.  Musculoskeletal:  General: Normal range of motion.     Cervical back: Normal range of motion and neck supple.  Skin:    General: Skin is warm and dry.  Neurological:     General: No focal deficit present.     Mental Status: She is alert and oriented to person, place, and time. Mental status is at baseline.     Deep Tendon Reflexes: Reflexes are normal and symmetric.  Psychiatric:        Mood and Affect: Mood normal.        Behavior: Behavior normal.        Thought Content: Thought content normal.        Judgment: Judgment normal.        Depression Screen  PHQ 2/9 Scores 05/04/2019 12/01/2017 10/08/2016  PHQ - 2 Score 0 0 0  PHQ- 9 Score 0 - -    No results found for any visits on 05/08/20.  Assessment & Plan    Routine Health Maintenance and Physical Exam  Exercise Activities and Dietary recommendations Goals   None     Immunization History  Administered Date(s) Administered   Influenza,inj,Quad PF,6+ Mos 10/12/2018    Health Maintenance  Topic Date Due   Hepatitis C Screening  Never done   COVID-19 Vaccine (1) Never done   TETANUS/TDAP  Never done   PAP SMEAR-Modifier  Never done   INFLUENZA VACCINE  06/23/2020   HIV Screening  Completed    Discussed health benefits of physical activity, and encouraged her to engage in regular exercise appropriate for her age and condition.  1. Annual physical exam Well woman exam per Dr. Matthew Saras. - CBC with Differential/Platelet - Comprehensive metabolic panel - Lipid panel - TSH - POCT urinalysis  dipstick  2. Chronic low back pain, unspecified back pain laterality, unspecified whether sciatica present There appears to be SI joint pain. Try ibuprofen 600 mg 3 times daily as needed. Follow-up 2 to 3 months. May need referral to physical therapy or to physiatry. I do not think this is radicular. - DG Lumbar Spine Complete   No follow-ups on file.        Nicklos Gaxiola Cranford Mon, MD  [facility] [phone] (phone) [phone] (fax)  [place]

## 2020-05-08 ENCOUNTER — Ambulatory Visit
Admission: RE | Admit: 2020-05-08 | Discharge: 2020-05-08 | Disposition: A | Discharge status: Home / Self Care | Payer: BC Managed Care – PPO | Source: Ambulatory Visit | Attending: Family Medicine | Admitting: Family Medicine

## 2020-05-08 ENCOUNTER — Encounter: Payer: Self-pay | Admitting: Family Medicine

## 2020-05-08 ENCOUNTER — Ambulatory Visit
Admission: RE | Admit: 2020-05-08 | Discharge: 2020-05-08 | Disposition: A | Discharge status: Home / Self Care | Payer: BC Managed Care – PPO | Attending: Family Medicine | Admitting: Family Medicine

## 2020-05-08 ENCOUNTER — Ambulatory Visit (INDEPENDENT_AMBULATORY_CARE_PROVIDER_SITE_OTHER): Payer: BC Managed Care – PPO | Admitting: Family Medicine

## 2020-05-08 ENCOUNTER — Other Ambulatory Visit: Payer: Self-pay

## 2020-05-08 VITALS — BP 104/72 | HR 82 | Temp 97.3°F | Ht 60.0 in | Wt 127.2 lb

## 2020-05-08 DIAGNOSIS — M545 Low back pain, unspecified: Secondary | ICD-10-CM

## 2020-05-08 DIAGNOSIS — Z0000 Encounter for general adult medical examination without abnormal findings: Secondary | ICD-10-CM

## 2020-05-08 DIAGNOSIS — G8929 Other chronic pain: Secondary | ICD-10-CM

## 2020-05-08 LAB — POCT URINALYSIS DIPSTICK
Bilirubin, UA: NEGATIVE
Blood, UA: NEGATIVE
Glucose, UA: NEGATIVE
Ketones, UA: NEGATIVE
Leukocytes, UA: NEGATIVE
Nitrite, UA: NEGATIVE
Protein, UA: NEGATIVE
Spec Grav, UA: <=1.005 — AB (ref 1.010–1.025)
Urobilinogen, UA: 0.2 E.U./dL
pH, UA: 8 (ref 5.0–8.0)

## 2020-05-08 MED ORDER — IBUPROFEN 600 MG PO TABS
600.0000 mg | ORAL_TABLET | Freq: Three times a day (TID) | ORAL | 3 refills | Status: AC | PRN
Start: 2020-05-08 — End: ?

## 2020-05-09 ENCOUNTER — Encounter: Payer: Self-pay | Admitting: Family Medicine

## 2020-05-09 LAB — CBC WITH DIFFERENTIAL/PLATELET
Basophils Absolute: 0 10*3/uL (ref 0.0–0.2)
Basos: 1 %
EOS (ABSOLUTE): 0 10*3/uL (ref 0.0–0.4)
Eos: 1 %
Hematocrit: 37 % (ref 34.0–46.6)
Hemoglobin: 11.3 g/dL (ref 11.1–15.9)
Immature Grans (Abs): 0 10*3/uL (ref 0.0–0.1)
Immature Granulocytes: 0 %
Lymphocytes Absolute: 0.8 10*3/uL (ref 0.7–3.1)
Lymphs: 16 %
MCH: 24.3 pg — ABNORMAL LOW (ref 26.6–33.0)
MCHC: 30.5 g/dL — ABNORMAL LOW (ref 31.5–35.7)
MCV: 80 fL (ref 79–97)
Monocytes Absolute: 0.5 10*3/uL (ref 0.1–0.9)
Monocytes: 9 %
Neutrophils Absolute: 3.7 10*3/uL (ref 1.4–7.0)
Neutrophils: 73 %
Platelets: 270 10*3/uL (ref 150–450)
RBC: 4.65 x10E6/uL (ref 3.77–5.28)
RDW: 16.5 % — ABNORMAL HIGH (ref 11.7–15.4)
WBC: 5 10*3/uL (ref 3.4–10.8)

## 2020-05-09 LAB — COMPREHENSIVE METABOLIC PANEL
ALT: 13 IU/L (ref 0–32)
AST: 20 IU/L (ref 0–40)
Albumin/Globulin Ratio: 1.6 (ref 1.2–2.2)
Albumin: 4.4 g/dL (ref 3.8–4.8)
Alkaline Phosphatase: 54 IU/L (ref 48–121)
BUN/Creatinine Ratio: 27 — ABNORMAL HIGH (ref 9–23)
BUN: 24 mg/dL (ref 6–24)
Bilirubin Total: 0.3 mg/dL (ref 0.0–1.2)
CO2: 23 mmol/L (ref 20–29)
Calcium: 9.7 mg/dL (ref 8.7–10.2)
Chloride: 99 mmol/L (ref 96–106)
Creatinine, Ser: 0.88 mg/dL (ref 0.57–1.00)
GFR calc Af Amer: 90 mL/min/{1.73_m2} (ref >59)
GFR calc non Af Amer: 78 mL/min/{1.73_m2} (ref >59)
Globulin, Total: 2.8 g/dL (ref 1.5–4.5)
Glucose: 76 mg/dL (ref 65–99)
Potassium: 4.5 mmol/L (ref 3.5–5.2)
Sodium: 137 mmol/L (ref 134–144)
Total Protein: 7.2 g/dL (ref 6.0–8.5)

## 2020-05-09 LAB — LIPID PANEL
Chol/HDL Ratio: 2.5 ratio (ref 0.0–4.4)
Cholesterol, Total: 192 mg/dL (ref 100–199)
HDL: 77 mg/dL (ref >39)
LDL Chol Calc (NIH): 102 mg/dL — ABNORMAL HIGH (ref 0–99)
Triglycerides: 71 mg/dL (ref 0–149)
VLDL Cholesterol Cal: 13 mg/dL (ref 5–40)

## 2020-05-09 LAB — TSH: TSH: 0.791 u[IU]/mL (ref 0.450–4.500)

## 2020-05-10 ENCOUNTER — Telehealth: Payer: Self-pay | Admitting: *Deleted

## 2020-05-10 NOTE — Telephone Encounter (Signed)
Copied from [place] [id number]. Topic: General - Other >> May 10, 2020  3:04 PM Celene Kras wrote: Reason for CRM: Pt called regarding the messages that she sent yesterday. Pt is requesting to have more information regarding her x rays and CT scan. Please advise.

## 2020-05-12 ENCOUNTER — Encounter: Payer: Self-pay | Admitting: Family Medicine

## 2020-05-17 NOTE — Telephone Encounter (Signed)
Patient spoke to Dr. Rosanna Randy via Deloris Ping message.

## 2020-08-06 NOTE — Progress Notes (Signed)
Established patient visit   Patient: Kaylee Benson   DOB: [birth year]   49 y.o. Female  MRN: [id number] Visit Date: 08/08/2020  Today's healthcare provider: Wilhemena Durie, MD   Chief Complaint  Patient presents with  . Follow-up   Subjective    HPI  Patient presents today in office for a 3 month follow up on low back pain. Patient says that the ibuprofen is helping for her back pain. Says that she is only takes 1 daily and tries to skip a day in between.  She has seen PT for this. Chronic low back pain, unspecified back pain laterality, unspecified whether sciatica present - 05/08/20 There appears to be SI joint pain. Try ibuprofen 600 mg 3 times daily as needed. Follow-up 2 to 3 months. May need referral to physical therapy or to physiatry. I do not think this is radicular     Medications: Outpatient Medications Prior to Visit  Medication Sig  . BIOTIN PO Take 1 tablet by mouth. Reported on 04/22/2016  . Cholecalciferol (VITAMIN D3) 25 MCG (1000 UT) CAPS Take 1 capsule by mouth daily at 2 am.  . fluticasone (FLONASE) 50 MCG/ACT nasal spray Place 2 sprays into both nostrils daily.  Marland Kitchen ibuprofen (ADVIL) 600 MG tablet Take 1 tablet (600 mg total) by mouth every 8 (eight) hours as needed.  . Misc Natural Products (OSTEO BI-FLEX ADV JOINT SHIELD) TABS Take 1 tablet by mouth daily.  . Multiple Vitamin (MULTIVITAMIN) tablet Take 1 tablet by mouth daily.  . naproxen sodium (ANAPROX) 220 MG tablet Take 220 mg by mouth as needed.  . Omega-3 Fatty Acids (FISH OIL BURP-LESS) 1000 MG CAPS Take 2 capsules by mouth daily.  . Turmeric 500 MG TABS Take 1 capsule by mouth daily.   . Zinc 50 MG TABS Take 1 tablet by mouth daily at 12 noon.  . Calcium Carbonate-Vitamin D (CALCIUM + D PO) Take by mouth once. (Patient not taking: Reported on 08/08/2020)   No facility-administered medications prior to visit.    Review of Systems    Objective    BP 116/74 (BP Location: Left Arm, Patient  Position: Sitting, Cuff Size: Normal)   Pulse 86   Temp 98.1 F (36.7 C) (Oral)   Ht 5' (1.524 m)   Wt 121 lb 12.8 oz (55.2 kg)   BMI 23.79 kg/m     Physical Exam Vitals reviewed.  Constitutional:      Appearance: Normal appearance.  HENT:     Head: Normocephalic and atraumatic.     Right Ear: External ear normal.     Left Ear: External ear normal.  Eyes:     General: No scleral icterus.    Conjunctiva/sclera: Conjunctivae normal.  Cardiovascular:     Rate and Rhythm: Normal rate and regular rhythm.     Pulses: Normal pulses.     Heart sounds: Normal heart sounds.  Pulmonary:     Effort: Pulmonary effort is normal.     Breath sounds: Normal breath sounds.  Musculoskeletal:     Right lower leg: No edema.     Left lower leg: No edema.  Skin:    General: Skin is warm and dry.  Neurological:     General: No focal deficit present.     Mental Status: She is alert and oriented to person, place, and time.  Psychiatric:        Mood and Affect: Mood normal.        Behavior:  Behavior normal.        Thought Content: Thought content normal.        Judgment: Judgment normal.       No results found for any visits on 08/08/20.  Assessment & Plan     1. Chronic low back pain, unspecified back pain laterality, unspecified whether sciatica present Consider Physiatry referral.  2. SI (sacroiliac) pain Sclerosing SI on CT scan in past.  3. Ductal carcinoma in situ (DCIS) of breast, unspecified laterality Followed by oncology   No follow-ups on file.      I, Wilhemena Durie, MD, have reviewed all documentation for this visit. The documentation on 08/13/20 for the exam, diagnosis, procedures, and orders are all accurate and complete.    Richard Cranford Mon, MD  [facility] [phone] (phone) [phone] (fax)  [place]

## 2020-08-08 ENCOUNTER — Encounter: Payer: Self-pay | Admitting: Family Medicine

## 2020-08-08 ENCOUNTER — Other Ambulatory Visit: Payer: Self-pay

## 2020-08-08 ENCOUNTER — Ambulatory Visit: Payer: BC Managed Care – PPO | Admitting: Family Medicine

## 2020-08-08 VITALS — BP 116/74 | HR 86 | Temp 98.1°F | Ht 60.0 in | Wt 121.8 lb

## 2020-08-08 DIAGNOSIS — D0510 Intraductal carcinoma in situ of unspecified breast: Secondary | ICD-10-CM | POA: Diagnosis not present

## 2020-08-08 DIAGNOSIS — M545 Low back pain, unspecified: Secondary | ICD-10-CM

## 2020-08-08 DIAGNOSIS — M533 Sacrococcygeal disorders, not elsewhere classified: Secondary | ICD-10-CM

## 2020-08-08 DIAGNOSIS — G8929 Other chronic pain: Secondary | ICD-10-CM | POA: Diagnosis not present

## 2020-08-13 DIAGNOSIS — G9589 Other specified diseases of spinal cord: Secondary | ICD-10-CM | POA: Insufficient documentation

## 2020-09-03 DIAGNOSIS — G4733 Obstructive sleep apnea (adult) (pediatric): Secondary | ICD-10-CM | POA: Diagnosis not present

## 2020-09-03 DIAGNOSIS — R739 Hyperglycemia, unspecified: Secondary | ICD-10-CM | POA: Diagnosis not present

## 2020-09-03 DIAGNOSIS — Z0000 Encounter for general adult medical examination without abnormal findings: Secondary | ICD-10-CM | POA: Diagnosis not present

## 2020-09-03 DIAGNOSIS — I700 Atherosclerosis of aorta: Secondary | ICD-10-CM | POA: Diagnosis not present

## 2020-09-26 DIAGNOSIS — D2230 Melanocytic nevi of unspecified part of face: Secondary | ICD-10-CM | POA: Diagnosis not present

## 2020-09-26 DIAGNOSIS — D224 Melanocytic nevi of scalp and neck: Secondary | ICD-10-CM | POA: Diagnosis not present

## 2020-09-26 DIAGNOSIS — D225 Melanocytic nevi of trunk: Secondary | ICD-10-CM | POA: Diagnosis not present

## 2020-09-26 DIAGNOSIS — D485 Neoplasm of uncertain behavior of skin: Secondary | ICD-10-CM | POA: Diagnosis not present

## 2020-10-29 DIAGNOSIS — C44722 Squamous cell carcinoma of skin of right lower limb, including hip: Secondary | ICD-10-CM | POA: Diagnosis not present

## 2020-12-31 DIAGNOSIS — R059 Cough, unspecified: Secondary | ICD-10-CM | POA: Diagnosis not present

## 2020-12-31 DIAGNOSIS — Z0000 Encounter for general adult medical examination without abnormal findings: Secondary | ICD-10-CM | POA: Diagnosis not present

## 2020-12-31 DIAGNOSIS — Z1389 Encounter for screening for other disorder: Secondary | ICD-10-CM | POA: Diagnosis not present

## 2020-12-31 DIAGNOSIS — R002 Palpitations: Secondary | ICD-10-CM | POA: Diagnosis not present

## 2020-12-31 DIAGNOSIS — Z131 Encounter for screening for diabetes mellitus: Secondary | ICD-10-CM | POA: Diagnosis not present

## 2020-12-31 DIAGNOSIS — R079 Chest pain, unspecified: Secondary | ICD-10-CM | POA: Diagnosis not present

## 2020-12-31 DIAGNOSIS — R0789 Other chest pain: Secondary | ICD-10-CM | POA: Diagnosis not present

## 2020-12-31 DIAGNOSIS — Z13220 Encounter for screening for lipoid disorders: Secondary | ICD-10-CM | POA: Diagnosis not present

## 2021-01-02 DIAGNOSIS — R002 Palpitations: Secondary | ICD-10-CM | POA: Diagnosis not present

## 2021-01-02 DIAGNOSIS — R0789 Other chest pain: Secondary | ICD-10-CM | POA: Diagnosis not present

## 2021-01-09 DIAGNOSIS — R002 Palpitations: Secondary | ICD-10-CM | POA: Diagnosis not present

## 2021-01-09 DIAGNOSIS — Z23 Encounter for immunization: Secondary | ICD-10-CM | POA: Diagnosis not present

## 2021-01-09 DIAGNOSIS — I447 Left bundle-branch block, unspecified: Secondary | ICD-10-CM | POA: Diagnosis not present

## 2021-01-09 DIAGNOSIS — R0602 Shortness of breath: Secondary | ICD-10-CM | POA: Diagnosis not present

## 2021-01-10 ENCOUNTER — Other Ambulatory Visit: Payer: Self-pay | Admitting: Internal Medicine

## 2021-01-10 DIAGNOSIS — Z1231 Encounter for screening mammogram for malignant neoplasm of breast: Secondary | ICD-10-CM

## 2021-01-14 DIAGNOSIS — R002 Palpitations: Secondary | ICD-10-CM | POA: Diagnosis not present

## 2021-01-14 DIAGNOSIS — K219 Gastro-esophageal reflux disease without esophagitis: Secondary | ICD-10-CM | POA: Diagnosis not present

## 2021-01-24 DIAGNOSIS — R002 Palpitations: Secondary | ICD-10-CM | POA: Diagnosis not present

## 2021-01-27 ENCOUNTER — Other Ambulatory Visit: Payer: Self-pay

## 2021-01-27 ENCOUNTER — Ambulatory Visit
Admission: RE | Admit: 2021-01-27 | Discharge: 2021-01-27 | Disposition: A | Discharge status: Home / Self Care | Payer: BC Managed Care – PPO | Source: Ambulatory Visit | Attending: Internal Medicine | Admitting: Internal Medicine

## 2021-01-27 ENCOUNTER — Other Ambulatory Visit: Payer: Self-pay | Admitting: Internal Medicine

## 2021-01-27 DIAGNOSIS — Z1231 Encounter for screening mammogram for malignant neoplasm of breast: Secondary | ICD-10-CM | POA: Diagnosis not present

## 2021-01-28 DIAGNOSIS — R002 Palpitations: Secondary | ICD-10-CM | POA: Diagnosis not present

## 2021-01-28 DIAGNOSIS — I447 Left bundle-branch block, unspecified: Secondary | ICD-10-CM | POA: Diagnosis not present

## 2021-01-28 DIAGNOSIS — I5189 Other ill-defined heart diseases: Secondary | ICD-10-CM | POA: Diagnosis not present

## 2021-01-30 ENCOUNTER — Other Ambulatory Visit: Payer: Self-pay | Admitting: Internal Medicine

## 2021-01-30 DIAGNOSIS — N6320 Unspecified lump in the left breast, unspecified quadrant: Secondary | ICD-10-CM

## 2021-01-30 DIAGNOSIS — N6310 Unspecified lump in the right breast, unspecified quadrant: Secondary | ICD-10-CM

## 2021-01-30 DIAGNOSIS — R928 Other abnormal and inconclusive findings on diagnostic imaging of breast: Secondary | ICD-10-CM

## 2021-01-30 DIAGNOSIS — R921 Mammographic calcification found on diagnostic imaging of breast: Secondary | ICD-10-CM

## 2021-01-31 ENCOUNTER — Ambulatory Visit
Admission: RE | Admit: 2021-01-31 | Discharge: 2021-01-31 | Disposition: A | Discharge status: Home / Self Care | Payer: BC Managed Care – PPO | Source: Ambulatory Visit | Attending: Internal Medicine | Admitting: Internal Medicine

## 2021-01-31 ENCOUNTER — Other Ambulatory Visit: Payer: Self-pay

## 2021-01-31 ENCOUNTER — Other Ambulatory Visit: Payer: Self-pay | Admitting: Internal Medicine

## 2021-01-31 DIAGNOSIS — R921 Mammographic calcification found on diagnostic imaging of breast: Secondary | ICD-10-CM | POA: Insufficient documentation

## 2021-01-31 DIAGNOSIS — N6320 Unspecified lump in the left breast, unspecified quadrant: Secondary | ICD-10-CM

## 2021-01-31 DIAGNOSIS — R928 Other abnormal and inconclusive findings on diagnostic imaging of breast: Secondary | ICD-10-CM

## 2021-01-31 DIAGNOSIS — N6310 Unspecified lump in the right breast, unspecified quadrant: Secondary | ICD-10-CM | POA: Insufficient documentation

## 2021-01-31 DIAGNOSIS — R922 Inconclusive mammogram: Secondary | ICD-10-CM | POA: Diagnosis not present

## 2021-01-31 DIAGNOSIS — Z853 Personal history of malignant neoplasm of breast: Secondary | ICD-10-CM | POA: Diagnosis not present

## 2021-02-05 ENCOUNTER — Other Ambulatory Visit: Payer: Self-pay | Admitting: Internal Medicine

## 2021-02-05 DIAGNOSIS — N6310 Unspecified lump in the right breast, unspecified quadrant: Secondary | ICD-10-CM

## 2021-02-05 DIAGNOSIS — R921 Mammographic calcification found on diagnostic imaging of breast: Secondary | ICD-10-CM

## 2021-02-05 DIAGNOSIS — R928 Other abnormal and inconclusive findings on diagnostic imaging of breast: Secondary | ICD-10-CM

## 2021-02-10 ENCOUNTER — Ambulatory Visit
Admission: RE | Admit: 2021-02-10 | Discharge: 2021-02-10 | Disposition: A | Discharge status: Home / Self Care | Payer: BC Managed Care – PPO | Source: Ambulatory Visit | Attending: Internal Medicine | Admitting: Internal Medicine

## 2021-02-10 ENCOUNTER — Other Ambulatory Visit: Payer: Self-pay

## 2021-02-10 DIAGNOSIS — R928 Other abnormal and inconclusive findings on diagnostic imaging of breast: Secondary | ICD-10-CM

## 2021-02-10 DIAGNOSIS — R921 Mammographic calcification found on diagnostic imaging of breast: Secondary | ICD-10-CM | POA: Diagnosis not present

## 2021-02-10 DIAGNOSIS — N6310 Unspecified lump in the right breast, unspecified quadrant: Secondary | ICD-10-CM | POA: Diagnosis not present

## 2021-02-10 DIAGNOSIS — D0512 Intraductal carcinoma in situ of left breast: Secondary | ICD-10-CM | POA: Diagnosis not present

## 2021-02-10 DIAGNOSIS — N6315 Unspecified lump in the right breast, overlapping quadrants: Secondary | ICD-10-CM | POA: Diagnosis not present

## 2021-02-10 DIAGNOSIS — N6081 Other benign mammary dysplasias of right breast: Secondary | ICD-10-CM | POA: Diagnosis not present

## 2021-02-10 DIAGNOSIS — D0511 Intraductal carcinoma in situ of right breast: Secondary | ICD-10-CM | POA: Diagnosis not present

## 2021-02-10 HISTORY — PX: BREAST BIOPSY: SHX20

## 2021-02-11 ENCOUNTER — Encounter: Payer: Self-pay | Admitting: *Deleted

## 2021-02-11 LAB — SURGICAL PATHOLOGY

## 2021-02-11 NOTE — Progress Notes (Signed)
Received message from Electa Sniff, RN from [facility] radiology that patient had been notified of her new diagnosis of DCIS and was ready for navigation.  Called patient to establish navigation services.  No answer.  Left message for patient to return my call.

## 2021-02-12 ENCOUNTER — Encounter: Payer: Self-pay | Admitting: *Deleted

## 2021-02-12 ENCOUNTER — Other Ambulatory Visit: Payer: Self-pay | Admitting: *Deleted

## 2021-02-12 DIAGNOSIS — D0512 Intraductal carcinoma in situ of left breast: Secondary | ICD-10-CM

## 2021-02-12 NOTE — Progress Notes (Signed)
Spoke to patient today.  She would like to see Dr. Bary Castilla for a surgical consultation.  She has a history of DCIS of the right breast and has seen Dr. Janese Banks in the past.  I have scheduled patient to see both physicians on Monday, 02/17/21 @ 9:30 and 10:45.  Will give educational material at that time.

## 2021-02-13 LAB — SURGICAL PATHOLOGY

## 2021-02-17 ENCOUNTER — Inpatient Hospital Stay: Payer: BC Managed Care – PPO | Attending: Oncology | Admitting: Oncology

## 2021-02-17 ENCOUNTER — Other Ambulatory Visit: Payer: Self-pay

## 2021-02-17 ENCOUNTER — Encounter: Payer: Self-pay | Admitting: Oncology

## 2021-02-17 VITALS — BP 107/67 | HR 61 | Temp 97.6°F | Resp 20 | Wt 112.0 lb

## 2021-02-17 DIAGNOSIS — Z923 Personal history of irradiation: Secondary | ICD-10-CM | POA: Insufficient documentation

## 2021-02-17 DIAGNOSIS — D0511 Intraductal carcinoma in situ of right breast: Secondary | ICD-10-CM | POA: Insufficient documentation

## 2021-02-17 DIAGNOSIS — Z7189 Other specified counseling: Secondary | ICD-10-CM | POA: Insufficient documentation

## 2021-02-17 DIAGNOSIS — Z79810 Long term (current) use of selective estrogen receptor modulators (SERMs): Secondary | ICD-10-CM | POA: Insufficient documentation

## 2021-02-17 DIAGNOSIS — Z170 Estrogen receptor positive status [ER+]: Secondary | ICD-10-CM | POA: Diagnosis not present

## 2021-02-17 DIAGNOSIS — Z79899 Other long term (current) drug therapy: Secondary | ICD-10-CM | POA: Diagnosis not present

## 2021-02-17 DIAGNOSIS — Z803 Family history of malignant neoplasm of breast: Secondary | ICD-10-CM | POA: Diagnosis not present

## 2021-02-17 DIAGNOSIS — R918 Other nonspecific abnormal finding of lung field: Secondary | ICD-10-CM | POA: Insufficient documentation

## 2021-02-17 DIAGNOSIS — Z8616 Personal history of COVID-19: Secondary | ICD-10-CM | POA: Diagnosis not present

## 2021-02-17 DIAGNOSIS — Z7982 Long term (current) use of aspirin: Secondary | ICD-10-CM | POA: Diagnosis not present

## 2021-02-17 DIAGNOSIS — D0512 Intraductal carcinoma in situ of left breast: Secondary | ICD-10-CM | POA: Diagnosis not present

## 2021-02-17 NOTE — Progress Notes (Signed)
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 She was started on tamoxifen after that but developed vaginal bleeding and chest pain following that. CT chest was negative for PE. She was also seen by Gyn and there was no evidence of endometrial cancer. She has remained off tamoxifen since then.Family history of breast cancer in her mother. She did undergo genetic testing and was negative.She also has b/l breast implants   Patient recently underwent Bilateral screening mammogram which showed possible masses in both the left and right breast.  Both the masses were biopsied.  Left breast mass biopsy was consistent with intermediate grade DCIS with comedonecrosis negative for invasive mammary carcinoma.  She had 2 right breast biopsies both of them showed no evidence of atypical proliferative disease or invasive carcinoma.  Pathology results forthcoming concordant  Interval history-patient recently lost her husband to Covid in February 2022.  She herself had Covid and following that has been having on and off episodes of palpitations for which she sees cardiology and has been started on metoprolol for a month.  She reports soreness from recent breast biopsy.  Denies other complaints at this time  ECOG PS- 0 Pain scale- 0  Review of systems- Review of Systems  Constitutional: Negative for chills, fever, malaise/fatigue and weight loss.  HENT: Negative for congestion, ear discharge and nosebleeds.   Eyes:  Negative for blurred vision.  Respiratory: Negative for cough, hemoptysis, sputum production, shortness of breath and wheezing.   Cardiovascular: Negative for chest pain, palpitations, orthopnea and claudication.  Gastrointestinal: Negative for abdominal pain, blood in stool, constipation, diarrhea, heartburn, melena, nausea and vomiting.  Genitourinary: Negative for dysuria, flank pain, frequency, hematuria and urgency.  Musculoskeletal: Negative for back pain, joint pain and myalgias.  Skin: Negative for rash.  Neurological: Negative for dizziness, tingling, focal weakness, seizures, weakness and headaches.  Endo/Heme/Allergies: Does not bruise/bleed easily.  Psychiatric/Behavioral: Negative for depression and suicidal ideas. The patient does not have insomnia.        No Known Allergies   Past Medical History:  Diagnosis Date  . Allergy    seasonal  . Breast cancer ([place]) right   radiation  . 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 Kidney stone   . Kidney stones   . Personal history of radiation therapy 2016   RIGHT lumpectomy  . PONV (postoperative nausea and vomiting)    nausea  . Sclerosis of sacroiliac joint      Past Surgical History:  Procedure Laterality Date  . AUGMENTATION MAMMAPLASTY    . BREAST BIOPSY Right 2016   LOW GRADE DUCTAL CARCINOMA IN SITU, 0.45 CM IN GREATEST DIMENSION  . BREAST BIOPSY Left 02/10/2021   Affirm bx of calcs  "Ribbon" clip-path pending  . BREAST CAPSULECTOMY WITH IMPLANT EXCHANGE Right 01/30/2015   Procedure: BREAST CAPSULECTOMY WITH IMPLANT EXCHANGE;  Surgeon: Bea Graff, MD;  Location: [facility];  Service: Plastics;  Laterality: Right;  . BREAST ENHANCEMENT SURGERY Bilateral   . BREAST LUMPECTOMY Right 2016   LOW  GRADE DUCTAL CARCINOMA IN SITU, 0.45 CM IN GREATEST DIMENSION  . BREAST LUMPECTOMY WITH NEEDLE LOCALIZATION Right 01/30/2015   Procedure: BREAST LUMPECTOMY WITH NEEDLE LOCALIZATION;  Surgeon: Erroll Luna,  MD;  Location: [facility];  Service: General;  Laterality: Right;  . BREAST SURGERY     bilateral breast implants 2001  . FOOT SURGERY Left   . LAPAROSCOPIC APPENDECTOMY N/A 11/02/2016   Procedure: APPENDECTOMY LAPAROSCOPIC;  Surgeon: Dia Crawford III, MD;  Location: [facility];  Service: General;  Laterality: N/A;  . MOUTH SURGERY    . ORIF ANKLE FRACTURE     left  . Surgery on left lower leg     2007    Social History   Socioeconomic History  . Marital status: Widowed    Spouse name: Not on file  . Number of children: Not on file  . Years of education: Not on file  . Highest education level: Not on file  Occupational History  . Not on file  Tobacco Use  . Smoking status: Never Smoker  . Smokeless tobacco: Never Used  Vaping Use  . Vaping Use: Never used  Substance and Sexual Activity  . Alcohol use: Yes    Comment: Drinks glass of wine 1-2 a month  . Drug use: No  . Sexual activity: Yes    Birth control/protection: Other-see comments    Comment: Essure procedure done 1 year ago, menarche age 56, G66, P 74, maternal age 20-25  Other Topics Concern  . Not on file  Social History Narrative  . Not on file   Social Determinants of Health   Financial Resource Strain: Not on file  Food Insecurity: Not on file  Transportation Needs: Not on file  Physical Activity: Not on file  Stress: Not on file  Social Connections: Not on file  Intimate Partner Violence: Not on file    Family History  Problem Relation Age of Onset  . Breast cancer Mother 59  . Heart disease Mother   . Hypertension Mother      Current Outpatient Medications:  .  aspirin 81 MG chewable tablet, , Disp: , Rfl:  .  BIOTIN PO, Take 1 tablet by mouth. Reported on 04/22/2016, Disp: , Rfl:  .  Calcium Carbonate-Vitamin D (CALCIUM + D PO), Take by mouth once., Disp: , Rfl:  .  Cholecalciferol (VITAMIN D3) 25 MCG (1000 UT) CAPS, Take 1 capsule by mouth daily at 2 am., Disp: , Rfl:  .   fluticasone (FLONASE) 50 MCG/ACT nasal spray, Place 2 sprays into both nostrils daily., Disp: 16 g, Rfl: 12 .  magnesium oxide (MAG-OX) 400 MG tablet, Take by mouth., Disp: , Rfl:  .  metoprolol succinate (TOPROL-XL) 25 MG 24 hr tablet, Take 1 tablet by mouth daily., Disp: , Rfl:  .  Misc Natural Products (OSTEO BI-FLEX ADV JOINT SHIELD) TABS, Take 1 tablet by mouth daily., Disp: , Rfl:  .  Multiple Vitamin (MULTIVITAMIN) tablet, Take 1 tablet by mouth daily., Disp: , Rfl:  .  Zinc 50 MG TABS, Take 1 tablet by mouth daily at 12 noon., Disp: , Rfl:  .  ibuprofen (ADVIL) 600 MG tablet, Take 1 tablet (600 mg total) by mouth every 8 (eight) hours as needed. (Patient not taking: Reported on 02/17/2021), Disp: 90 tablet, Rfl: 3 .  naproxen sodium (ANAPROX) 220 MG tablet, Take 220 mg by mouth as needed. (Patient not taking: Reported on 02/17/2021), Disp: , Rfl:  .  Omega-3 Fatty Acids (FISH  OIL BURP-LESS) 1000 MG CAPS, Take 2 capsules by mouth daily. (Patient not taking: Reported on 02/17/2021), Disp: , Rfl:  .  Turmeric 500 MG TABS, Take 1 capsule by mouth daily.  (Patient not taking: Reported on 02/17/2021), Disp: , Rfl:   Physical exam:  Vitals:   02/17/21 1102  BP: 107/67  Pulse: 61  Resp: 20  Temp: 97.6 F (36.4 C)  TempSrc: Tympanic  SpO2: 100%  Weight: 112 lb (50.8 kg)   Physical Exam Constitutional:      General: She is not in acute distress. Eyes:     Pupils: Pupils are equal, round, and reactive to light.  Cardiovascular:     Rate and Rhythm: Normal rate and regular rhythm.     Heart sounds: Normal heart sounds.  Pulmonary:     Effort: Pulmonary effort is normal.  Skin:    General: Skin is warm and dry.  Neurological:     Mental Status: She is alert and oriented to person, place, and time.   Breast exam: Patient is s/p right lumpectomy with well-healed surgical scar.  Bilateral breast implants in place.  Bruising noted at the site of bilateral breast biopsies.  No palpable  bilateral breast masses.  No palpable bilateral axillary adenopathy.  CMP Latest Ref Rng & Units 05/08/2020  Glucose 65 - 99 mg/dL 76  BUN 6 - 24 mg/dL 24  Creatinine 0.57 - 1.00 mg/dL 0.88  Sodium 134 - 144 mmol/L 137  Potassium 3.5 - 5.2 mmol/L 4.5  Chloride 96 - 106 mmol/L 99  CO2 20 - 29 mmol/L 23  Calcium 8.7 - 10.2 mg/dL 9.7  Total Protein 6.0 - 8.5 g/dL 7.2  Total Bilirubin 0.0 - 1.2 mg/dL 0.3  Alkaline Phos 48 - 121 IU/L 54  AST 0 - 40 IU/L 20  ALT 0 - 32 IU/L 13   CBC Latest Ref Rng & Units 05/08/2020  WBC 3.4 - 10.8 x10E3/uL 5.0  Hemoglobin 11.1 - 15.9 g/dL 11.3  Hematocrit 34.0 - 46.6 % 37.0  Platelets 150 - 450 x10E3/uL 270    No images are attached to the encounter.  US BREAST LTD UNI RIGHT INC AXILLA  Result Date: 01/31/2021 CLINICAL DATA:  50 year old female for further evaluation of possible masses within both breasts and LEFT breast calcifications on screening mammogram. History of RIGHT breast cancer and lumpectomy in 2016. EXAM: DIGITAL DIAGNOSTIC BILATERAL MAMMOGRAM WITH IMPLANTS, CAD AND TOMOSYNTHESIS; ULTRASOUND RIGHT BREAST LIMITED TECHNIQUE: Bilateral digital diagnostic mammography and breast tomosynthesis was performed. The images were evaluated with computer-aided detection. Standard and/or implant displaced views were performed.; Targeted ultrasound examination of the right breast was performed COMPARISON:  Previous exam(s). ACR Breast Density Category c: The breast tissue is heterogeneously dense, which may obscure small masses. FINDINGS: Spot compression views both breasts and full field and magnification views of the LEFT breast are performed. Two adjacent circumscribed oval masses are identified within the anterior UPPER RIGHT breast A 0.3 cm group of heterogeneous calcifications within the anterior UPPER-OUTER LEFT breast are noted. No persistent abnormality is identified in the area of the possible posterior LEFT breast mass. Targeted ultrasound is  performed, showing 2 oval hypoechoic masses at the 12 o'clock position of the RIGHT breast 1 cm from the nipple, the 1st measuring 0.5 x 0.2 x 0.6 cm and the 2nd measuring 0.6 x 0.3 x 0.7 cm. No abnormal RIGHT axillary lymph nodes are noted. IMPRESSION: 1. Two indeterminate masses within the UPPER RIGHT breast measuring 0.6 and 0.7  cm respectively. Tissue sampling is recommended given that these masses have a similar appearance to patient's breast cancer diagnosed in 2015. 2. No abnormal appearing RIGHT axillary lymph nodes. 3. 0.3 cm group of indeterminate calcifications within the anterior UPPER-OUTER LEFT breast. Tissue sampling is recommended. 4. No persistent mammographic abnormality identified in the area of the possible posterior LEFT breast mass RECOMMENDATION: 1. Ultrasound-guided biopsies of 2 adjacent masses within the UPPER RIGHT breast. 2. Stereotactic guided biopsy of UPPER-OUTER LEFT breast calcifications. If any of the above biopsies are positive for malignancy, then strongly consider breast MRI given dense breast tissue. I have discussed the findings and recommendations with the patient. If applicable, a reminder letter will be sent to the patient regarding the next appointment. BI-RADS CATEGORY  4: Suspicious. Electronically Signed   By: Margarette Canada M.D.   On: 01/31/2021 11:48   MM DIAG BREAST W/IMPLANT TOMO BILATERAL  Result Date: 01/31/2021 CLINICAL DATA:  50 year old female for further evaluation of possible masses within both breasts and LEFT breast calcifications on screening mammogram. History of RIGHT breast cancer and lumpectomy in 2016. EXAM: DIGITAL DIAGNOSTIC BILATERAL MAMMOGRAM WITH IMPLANTS, CAD AND TOMOSYNTHESIS; ULTRASOUND RIGHT BREAST LIMITED TECHNIQUE: Bilateral digital diagnostic mammography and breast tomosynthesis was performed. The images were evaluated with computer-aided detection. Standard and/or implant displaced views were performed.; Targeted ultrasound examination of  the right breast was performed COMPARISON:  Previous exam(s). ACR Breast Density Category c: The breast tissue is heterogeneously dense, which may obscure small masses. FINDINGS: Spot compression views both breasts and full field and magnification views of the LEFT breast are performed. Two adjacent circumscribed oval masses are identified within the anterior UPPER RIGHT breast A 0.3 cm group of heterogeneous calcifications within the anterior UPPER-OUTER LEFT breast are noted. No persistent abnormality is identified in the area of the possible posterior LEFT breast mass. Targeted ultrasound is performed, showing 2 oval hypoechoic masses at the 12 o'clock position of the RIGHT breast 1 cm from the nipple, the 1st measuring 0.5 x 0.2 x 0.6 cm and the 2nd measuring 0.6 x 0.3 x 0.7 cm. No abnormal RIGHT axillary lymph nodes are noted. IMPRESSION: 1. Two indeterminate masses within the UPPER RIGHT breast measuring 0.6 and 0.7 cm respectively. Tissue sampling is recommended given that these masses have a similar appearance to patient's breast cancer diagnosed in 2015. 2. No abnormal appearing RIGHT axillary lymph nodes. 3. 0.3 cm group of indeterminate calcifications within the anterior UPPER-OUTER LEFT breast. Tissue sampling is recommended. 4. No persistent mammographic abnormality identified in the area of the possible posterior LEFT breast mass RECOMMENDATION: 1. Ultrasound-guided biopsies of 2 adjacent masses within the UPPER RIGHT breast. 2. Stereotactic guided biopsy of UPPER-OUTER LEFT breast calcifications. If any of the above biopsies are positive for malignancy, then strongly consider breast MRI given dense breast tissue. I have discussed the findings and recommendations with the patient. If applicable, a reminder letter will be sent to the patient regarding the next appointment. BI-RADS CATEGORY  4: Suspicious. Electronically Signed   By: Margarette Canada M.D.   On: 01/31/2021 11:48   MM 3D SCREEN BREAST  W/IMPLANT BILATERAL  Result Date: 01/27/2021 CLINICAL DATA:  Screening. EXAM: DIGITAL SCREENING BILATERAL MAMMOGRAM WITH IMPLANTS, CAD AND TOMOSYNTHESIS TECHNIQUE: Bilateral screening digital craniocaudal and mediolateral oblique mammograms were obtained. Bilateral screening digital breast tomosynthesis was performed. The images were evaluated with computer-aided detection. Standard and/or implant displaced views were performed. COMPARISON:  Previous exam(s). ACR Breast Density Category c: The breast tissue  is heterogeneously dense, which may obscure small masses. FINDINGS: The patient has retropectoral implants. In the right breast , a possible mass requires further evaluation. This possible mass is seen within the inner RIGHT breast, at anterior depth, cc slice 29, possible correlates within the upper RIGHT breast on MLO 1/1 slice 24 and MLO 2/2 slice 23. In the left breast calcifications and a possible mass requires further evaluation. These calcifications are seen within the upper-outer quadrant of the LEFT breast at anterior depth. The possible mass is seen within the upper LEFT breast, at posterior depth, MLO views only, MLO 1/2 slice 25 and MLO 2/2 slice 25. IMPRESSION: Further evaluation is suggested for possible mass in the right breast. Further evaluation is suggested for calcifications and a possible mass in the left breast. The patient will be contacted regarding the findings, and additional imaging will be scheduled. RECOMMENDATION: Diagnostic mammogram and possibly ultrasound of both breasts. (Code:FI-B-70M) BI-RADS CATEGORY  0: Incomplete. Need additional imaging evaluation and/or prior mammograms for comparison. Electronically Signed   By: Franki Cabot M.D.   On: 01/27/2021 11:17   MM CLIP PLACEMENT LEFT  Result Date: 02/10/2021 CLINICAL DATA:  Status post ultrasound-guided core biopsies of the right breast (x2) and stereotactic biopsy of the left breast. EXAM: 3D DIAGNOSTIC BILATERAL MAMMOGRAM  POST ULTRASOUND BIOPSIES OF THE RIGHT BREAST AND STEREOTACTIC BIOPSY OF THE LEFT BREAST COMPARISON:  Previous exam(s). FINDINGS: 3D Mammographic images were obtained following ultrasound-guided core biopsies of the right breast and stereotactic biopsy of the left breast. In the right breast there is a Vision shaped clip in the 11 o'clock region of the right breast and a Q shaped clip in the 12 o'clock region of the right breast. The ribbon shaped clip in the upper-outer quadrant of the left breast is located 9 mm anterior to the biopsied calcifications. There are residual calcifications that can be use for localization purposes. IMPRESSION: Appropriate positioning of the vision shaped clip in the 11 o'clock region of the right breast and the Q shaped clip in the 12 o'clock region of the right breast. The ribbon shaped clip in the upper-outer quadrant of the left breast is located approximately 9 mm anterior to the biopsied calcifications. Residual calcifications can be use for localization purposes. Final Assessment: Post Procedure Mammograms for Marker Placement Electronically Signed   By: Lillia Mountain M.D.   On: 02/10/2021 11:30   MM CLIP PLACEMENT RIGHT  Result Date: 02/10/2021 CLINICAL DATA:  Status post ultrasound-guided core biopsies of the right breast (x2) and stereotactic biopsy of the left breast. EXAM: 3D DIAGNOSTIC BILATERAL MAMMOGRAM POST ULTRASOUND BIOPSIES OF THE RIGHT BREAST AND STEREOTACTIC BIOPSY OF THE LEFT BREAST COMPARISON:  Previous exam(s). FINDINGS: 3D Mammographic images were obtained following ultrasound-guided core biopsies of the right breast and stereotactic biopsy of the left breast. In the right breast there is a Vision shaped clip in the 11 o'clock region of the right breast and a Q shaped clip in the 12 o'clock region of the right breast. The ribbon shaped clip in the upper-outer quadrant of the left breast is located 9 mm anterior to the biopsied calcifications. There are  residual calcifications that can be use for localization purposes. IMPRESSION: Appropriate positioning of the vision shaped clip in the 11 o'clock region of the right breast and the Q shaped clip in the 12 o'clock region of the right breast. The ribbon shaped clip in the upper-outer quadrant of the left breast is located approximately 9 mm anterior  to the biopsied calcifications. Residual calcifications can be use for localization purposes. Final Assessment: Post Procedure Mammograms for Marker Placement Electronically Signed   By: Lillia Mountain M.D.   On: 02/10/2021 11:30   MM LT BREAST BX W LOC DEV 1ST LESION IMAGE BX SPEC STEREO GUIDE  Addendum Date: 02/12/2021   ADDENDUM REPORT: 02/12/2021 13:01 ADDENDUM: PATHOLOGY revealed: Site A. BREAST, LEFT UPPER OUTER QUADRANT; STEREOTACTIC CORE NEEDLE BIOPSY: - DUCTAL CARCINOMA IN SITU, INTERMEDIATE GRADE, WITH COMEDONECROSIS AND ASSOCIATED CALCIFICATIONS. - BACKGROUND FIBROCYSTIC AND APOCRINE CHANGES. - NEGATIVE FOR INVASIVE CARCINOMA. Comment: DCIS is present in 1 of 7 tissue blocks, and spans 4 mm in greatest linear extent. Pathology results are CONCORDANT with imaging findings, per Dr. Lillia Mountain. PATHOLOGY revealed: Site B. BREAST, RIGHT AT 11:00, 1 CM FROM THE NIPPLE; ULTRASOUND-GUIDED CORE NEEDLE BIOPSY: - FIBROEPITHELIAL PROLIFERATION WITH SCLEROSIS AND FOCAL ADENOSIS. - NEGATIVE FOR ATYPICAL PROLIFERATIVE BREAST DISEASE IN THIS SAMPLE. Comment: Diagnostic considerations include (but are not limited to) a complex fibroadenoma, intraductal papilloma with sclerosis, or complex sclerosing lesion. Although atypia is not present in this biopsy, it cannot be excluded elsewhere in this lesion. Due to the appearance of adenosis within this lesion, immunohistochemical studies directed against myoepithelial markers p63 and calponin were performed, and highlight an intact myoepithelial cell layer. There is no evidence of invasive carcinoma. Correlation with radiographic  findings is required. Pathology results are CONCORDANT with imaging findings, per Dr. Lillia Mountain. PATHOLOGY revealed: Site C. BREAST, RIGHT AT 12:00, 1 CM FROM THE NIPPLE; ULTRASOUND-GUIDED CORE NEEDLE BIOPSY: - BENIGN MAMMARY PARENCHYMA WITH DENSE STROMAL FIBROSIS, FIBROCYSTIC CHANGES, AND ASSOCIATED MICROCALCIFICATIONS. - NEGATIVE FOR ATYPICAL PROLIFERATIVE BREAST DISEASE. Pathology results are CONCORDANT with imaging findings, per Dr. Lillia Mountain with consideration for excision. Pathology results and recommendations below were discussed with patient by telephone on 02/12/2021. Patient reported biopsy site within normal limits with slight tenderness at the site. Post biopsy care instructions were reviewed, questions were answered and my direct phone number was provided to patient. Patient was instructed to call [facility] if any concerns or questions arise related to the biopsy. Recommendations: 1. Surgical consultation for all three biopsied masses listed above: Request for surgical consultation relayed to Al Pimple RN and Tanya Nones RN at [facility] by Electa Sniff RN on 02/11/2021. Patient has appointments scheduled to see surgeon (Dr. Ollen Bowl) and oncologist (Dr. Randa Evens) both on Monday 02/17/2021. 2. Consider a bilateral breast MRI given patient's breast density and bilateral breast implants. Pathology results reported by Electa Sniff RN on 02/12/2021. Electronically Signed   By: Lillia Mountain M.D.   On: 02/12/2021 13:01   Result Date: 02/12/2021 CLINICAL DATA:  Left breast calcifications. EXAM: RIGHT BREAST STEREOTACTIC CORE NEEDLE BIOPSY COMPARISON:  Previous exams. FINDINGS: The patient and I discussed the procedure of stereotactic-guided biopsy including benefits and alternatives. We discussed the high likelihood of a successful procedure. We discussed the risks of the procedure including infection, bleeding, tissue injury, clip migration, and inadequate sampling.  Informed written consent was given. The usual time out protocol was performed immediately prior to the procedure. Using sterile technique and 1% lidocaine and 1% lidocaine with epinephrine as local anesthetic, under stereotactic guidance, a 9 gauge vacuum assisted device was used to perform core needle biopsy of calcifications in the upper-outer quadrant of left breast using a superior to inferior approach. Specimen radiograph was performed showing calcifications are present in the tissue samples. Specimens with calcifications are identified for pathology. Lesion  quadrant: Upper-outer quadrant At the conclusion of the procedure, ribbon shaped tissue marker clip was deployed into the biopsy cavity. Follow-up 2-view mammogram was performed and dictated separately. IMPRESSION: Stereotactic-guided biopsy of the left breast. No apparent complications. Electronically Signed: By: Lillia Mountain M.D. On: 02/10/2021 11:05   [place] RT BREAST BX W LOC DEV 1ST LESION IMG BX SPEC US GUIDE  Addendum Date: 02/12/2021   ADDENDUM REPORT: 02/12/2021 12:59 ADDENDUM: PATHOLOGY revealed: Site A. BREAST, LEFT UPPER OUTER QUADRANT; STEREOTACTIC CORE NEEDLE BIOPSY: - DUCTAL CARCINOMA IN SITU, INTERMEDIATE GRADE, WITH COMEDONECROSIS AND ASSOCIATED CALCIFICATIONS. - BACKGROUND FIBROCYSTIC AND APOCRINE CHANGES. - NEGATIVE FOR INVASIVE CARCINOMA. Comment: DCIS is present in 1 of 7 tissue blocks, and spans 4 mm in greatest linear extent. Pathology results are CONCORDANT with imaging findings, per Dr. Lillia Mountain. PATHOLOGY revealed: Site B. BREAST, RIGHT AT 11:00, 1 CM FROM THE NIPPLE; ULTRASOUND-GUIDED CORE NEEDLE BIOPSY: - FIBROEPITHELIAL PROLIFERATION WITH SCLEROSIS AND FOCAL ADENOSIS. - NEGATIVE FOR ATYPICAL PROLIFERATIVE BREAST DISEASE IN THIS SAMPLE. Comment: Diagnostic considerations include (but are not limited to) a complex fibroadenoma, intraductal papilloma with sclerosis, or complex sclerosing lesion. Although atypia is not present in  this biopsy, it cannot be excluded elsewhere in this lesion. Due to the appearance of adenosis within this lesion, immunohistochemical studies directed against myoepithelial markers p63 and calponin were performed, and highlight an intact myoepithelial cell layer. There is no evidence of invasive carcinoma. Correlation with radiographic findings is required. Pathology results are CONCORDANT with imaging findings, per Dr. Lillia Mountain. PATHOLOGY revealed: Site C. BREAST, RIGHT AT 12:00, 1 CM FROM THE NIPPLE; ULTRASOUND-GUIDED CORE NEEDLE BIOPSY: - BENIGN MAMMARY PARENCHYMA WITH DENSE STROMAL FIBROSIS, FIBROCYSTIC CHANGES, AND ASSOCIATED MICROCALCIFICATIONS. - NEGATIVE FOR ATYPICAL PROLIFERATIVE BREAST DISEASE. Pathology results are CONCORDANT with imaging findings, per Dr. Lillia [place] with consideration for excision. Pathology results and recommendations below were discussed with patient by telephone on 02/12/2021. Patient reported biopsy site within normal limits with slight tenderness at the site. Post biopsy care instructions were reviewed, questions were answered and my direct phone number was provided to patient. Patient was instructed to call [facility] if any concerns or questions arise related to the biopsy. Recommendations: 1. Surgical consultation for all three biopsied masses listed above: Request for surgical consultation relayed to Al Pimple RN and Tanya Nones RN at [facility] by Electa Sniff RN on 02/11/2021. Patient has appointments scheduled to see surgeon (Dr. Ollen Bowl) and oncologist (Dr. Randa Evens) both on Monday 02/17/2021. 2. Consider a bilateral breast MRI given patient's breast density and bilateral breast implants. Pathology results reported by Electa Sniff RN on 02/12/2021. Electronically Signed   By: Lillia Mountain M.D.   On: 02/12/2021 12:59   Result Date: 02/12/2021 CLINICAL DATA:  Two indeterminate masses in the 11 o'clock region of the right breast 1 cm  from the nipple and 12 o'clock region of the right breast 1 cm from the nipple (on the patient's ultrasound dated 01/31/2021 both masses were labeled at 12 o'clock). EXAM: ULTRASOUND GUIDED RIGHT BREAST CORE NEEDLE BIOPSY COMPARISON:  Previous exam(s). PROCEDURE: I met with the patient and we discussed the procedure of ultrasound-guided biopsy, including benefits and alternatives. We discussed the high likelihood of a successful procedure. We discussed the risks of the procedure, including infection, bleeding, tissue injury, clip migration, and inadequate sampling. Informed written consent was given. The usual time-out protocol was performed immediately prior to the procedure. Lesion quadrant: (11 O'CLOCK 1 CM FROM THE  NIPPLE Using sterile technique and 1% lidocaine and 1% lidocaine with epinephrine as local anesthetic, under direct ultrasound visualization, a 14 gauge spring-loaded device was used to perform biopsy of A MASS IN THE 11 O'CLOCK REGION OF THE RIGHT BREAST using a lateral to medial approach. At the conclusion of the procedure vision shaped tissue marker clip was deployed into the biopsy cavity. Follow up 2 view mammogram was performed and dictated separately. I met with the patient and we discussed the procedure of ultrasound-guided biopsy, including benefits and alternatives. We discussed the high likelihood of a successful procedure. We discussed the risks of the procedure, including infection, bleeding, tissue injury, clip migration, and inadequate sampling. Informed written consent was given. The usual time-out protocol was performed immediately prior to the procedure. Lesion quadrant: 12 O'CLOCK 1 CM FROM THE NIPPLE Using sterile technique and 1% lidocaine and 1% lidocaine with epinephrine as local anesthetic, under direct ultrasound visualization, a 14 gauge spring-loaded device was used to perform biopsy of A MASS IN THE 12 O'CLOCK REGION OF THE RIGHT BREAST using a lateral to medial approach.  At the conclusion of the procedure Q shaped tissue marker clip was deployed into the biopsy cavity. Follow up 2 view mammogram was performed and dictated separately. IMPRESSION: Ultrasound biopsies of the right breast.  No apparent complications. Electronically Signed: By: Lillia Mountain M.D. On: 02/10/2021 10:30   [place] RT BREAST BX W LOC DEV EA ADD LESION IMG BX SPEC US GUIDE  Addendum Date: 02/12/2021   ADDENDUM REPORT: 02/12/2021 12:59 ADDENDUM: PATHOLOGY revealed: Site A. BREAST, LEFT UPPER OUTER QUADRANT; STEREOTACTIC CORE NEEDLE BIOPSY: - DUCTAL CARCINOMA IN SITU, INTERMEDIATE GRADE, WITH COMEDONECROSIS AND ASSOCIATED CALCIFICATIONS. - BACKGROUND FIBROCYSTIC AND APOCRINE CHANGES. - NEGATIVE FOR INVASIVE CARCINOMA. Comment: DCIS is present in 1 of 7 tissue blocks, and spans 4 mm in greatest linear extent. Pathology results are CONCORDANT with imaging findings, per Dr. Lillia Mountain. PATHOLOGY revealed: Site B. BREAST, RIGHT AT 11:00, 1 CM FROM THE NIPPLE; ULTRASOUND-GUIDED CORE NEEDLE BIOPSY: - FIBROEPITHELIAL PROLIFERATION WITH SCLEROSIS AND FOCAL ADENOSIS. - NEGATIVE FOR ATYPICAL PROLIFERATIVE BREAST DISEASE IN THIS SAMPLE. Comment: Diagnostic considerations include (but are not limited to) a complex fibroadenoma, intraductal papilloma with sclerosis, or complex sclerosing lesion. Although atypia is not present in this biopsy, it cannot be excluded elsewhere in this lesion. Due to the appearance of adenosis within this lesion, immunohistochemical studies directed against myoepithelial markers p63 and calponin were performed, and highlight an intact myoepithelial cell layer. There is no evidence of invasive carcinoma. Correlation with radiographic findings is required. Pathology results are CONCORDANT with imaging findings, per Dr. Lillia Mountain. PATHOLOGY revealed: Site C. BREAST, RIGHT AT 12:00, 1 CM FROM THE NIPPLE; ULTRASOUND-GUIDED CORE NEEDLE BIOPSY: - BENIGN MAMMARY PARENCHYMA WITH DENSE STROMAL FIBROSIS,  FIBROCYSTIC CHANGES, AND ASSOCIATED MICROCALCIFICATIONS. - NEGATIVE FOR ATYPICAL PROLIFERATIVE BREAST DISEASE. Pathology results are CONCORDANT with imaging findings, per Dr. Lillia Mountain with consideration for excision. Pathology results and recommendations below were discussed with patient by telephone on 02/12/2021. Patient reported biopsy site within normal limits with slight tenderness at the site. Post biopsy care instructions were reviewed, questions were answered and my direct phone number was provided to patient. Patient was instructed to call [facility] if any concerns or questions arise related to the biopsy. Recommendations: 1. Surgical consultation for all three biopsied masses listed above: Request for surgical consultation relayed to Al Pimple RN and Tanya Nones RN at [facility] by Electa Sniff RN  on 02/11/2021. Patient has appointments scheduled to see surgeon (Dr. Ollen Bowl) and oncologist (Dr. Randa Evens) both on Monday 02/17/2021. 2. Consider a bilateral breast MRI given patient's breast density and bilateral breast implants. Pathology results reported by Electa Sniff RN on 02/12/2021. Electronically Signed   By: Lillia Mountain M.D.   On: 02/12/2021 12:59   Result Date: 02/12/2021 CLINICAL DATA:  Two indeterminate masses in the 11 o'clock region of the right breast 1 cm from the nipple and 12 o'clock region of the right breast 1 cm from the nipple (on the patient's ultrasound dated 01/31/2021 both masses were labeled at 12 o'clock). EXAM: ULTRASOUND GUIDED RIGHT BREAST CORE NEEDLE BIOPSY COMPARISON:  Previous exam(s). PROCEDURE: I met with the patient and we discussed the procedure of ultrasound-guided biopsy, including benefits and alternatives. We discussed the high likelihood of a successful procedure. We discussed the risks of the procedure, including infection, bleeding, tissue injury, clip migration, and inadequate sampling. Informed written consent was  given. The usual time-out protocol was performed immediately prior to the procedure. Lesion quadrant: (11 O'CLOCK 1 CM FROM THE NIPPLE Using sterile technique and 1% lidocaine and 1% lidocaine with epinephrine as local anesthetic, under direct ultrasound visualization, a 14 gauge spring-loaded device was used to perform biopsy of A MASS IN THE 11 O'CLOCK REGION OF THE RIGHT BREAST using a lateral to medial approach. At the conclusion of the procedure vision shaped tissue marker clip was deployed into the biopsy cavity. Follow up 2 view mammogram was performed and dictated separately. I met with the patient and we discussed the procedure of ultrasound-guided biopsy, including benefits and alternatives. We discussed the high likelihood of a successful procedure. We discussed the risks of the procedure, including infection, bleeding, tissue injury, clip migration, and inadequate sampling. Informed written consent was given. The usual time-out protocol was performed immediately prior to the procedure. Lesion quadrant: 12 O'CLOCK 1 CM FROM THE NIPPLE Using sterile technique and 1% lidocaine and 1% lidocaine with epinephrine as local anesthetic, under direct ultrasound visualization, a 14 gauge spring-loaded device was used to perform biopsy of A MASS IN THE 12 O'CLOCK REGION OF THE RIGHT BREAST using a lateral to medial approach. At the conclusion of the procedure Q shaped tissue marker clip was deployed into the biopsy cavity. Follow up 2 view mammogram was performed and dictated separately. IMPRESSION: Ultrasound biopsies of the right breast.  No apparent complications. Electronically Signed: By: Lillia Mountain M.D. On: 02/10/2021 10:30     Assessment and plan- Patient is a 50 y.o. female with prior history of right breast DCIS now presenting with left breast DCIS ER positive here to discuss further management  Patient has met with Dr. Bary Castilla today for management of left breast DCIS.  Given the recent death of her  husband as well as ongoing cardiac issues of palpitations plan was to defer left breast lumpectomy for about 2 to 3 months.  Right breast biopsy did not show any evidence of malignancy and pathology results were deemed concordant.  However patient tells me that she received a call from radiology who recommended that she would need surgical excision of the right breast mass as well.  Patient has had prior issues tolerating tamoxifen back in 2016 for right breast DCIS due to palpitations.  I will discuss her case at the next breast tumor board to get clarification if right breast mass which was deemed benign needs to be surgically excised.  I also discussed her case personally  with Dr. Bary Castilla today.  This is her second DCIS and it is unclear if patient will tolerate hormone therapy following left breast lumpectomy and adjuvant radiation therapy.  Certainly a bilateral mastectomy for left breast DCIS seems to be overkill especially given her existing breast implants which will also need to be exchanged.  I am leaning towards giving her a trial of lower dose tamoxifen 10 mg or 5 mg now to see if she would tolerate it better instead of 20 mg especially since she is already following up with cardiology and is on metoprolol.  The main reason for her to discontinue tamoxifen in the past was chest palpitations and concern for abnormal vaginal bleeding.    Patient is still premenopausal. Discussed with the patient that ovarian suppression plus AI is typically not recommended as endocrine therapy for DCIS in premenopausal women.  If patient can tolerate lower dose of tamoxifen, it would certainly be reasonable to proceed with left breast lumpectomy followed by adjuvant radiation therapy and continue tamoxifen at a lower dose for 5 years.   I will call the patient back after discussing her case at tumor board 2 weeks from now.   Total face to face encounter time for this patient visit was 30 min.  Time spent in  discussing patients case with Dr. Bary Castilla    Visit Diagnosis 1. Ductal carcinoma in situ (DCIS) of left breast   2. Goals of care, counseling/discussion      Dr. Randa Evens, MD, MPH [facility] at [facility] [phone] 02/17/2021 9:09 PM

## 2021-02-17 NOTE — Progress Notes (Addendum)
Supported patient at Med/Onc consult with Dr. Janese Banks.  Dr. Janese Banks discussed history of breast cancer in 2016, in relation to treatment plan for DCIS in left breast now.  Patient will be presented at Breast Conference on 03/03/21.  Given Breast Cancer Treatment Handbook.  Patient reports she had genetic testing in 2016, and 2019.  States negative for mutation.  Gave copies to Dr. Bary Castilla this morning. Per Dr. Metro Kung note from January 2017, patient stopped taking Tamoxifen at that time due to shortness of breath, and chest pain, and abnormal vaginal bleeding.

## 2021-02-18 ENCOUNTER — Encounter: Payer: Self-pay | Admitting: Oncology

## 2021-02-18 NOTE — Progress Notes (Signed)
Patient called back with questions related to treatment. Discussed questions with Dr. Janese Banks, and assured patient that her questions will be addressed at the next case conference per Dr. Janese Banks, so that treatment plan can be defined.

## 2021-02-19 DIAGNOSIS — N6312 Unspecified lump in the right breast, upper inner quadrant: Secondary | ICD-10-CM | POA: Diagnosis not present

## 2021-02-19 DIAGNOSIS — R921 Mammographic calcification found on diagnostic imaging of breast: Secondary | ICD-10-CM | POA: Diagnosis not present

## 2021-02-19 DIAGNOSIS — N6489 Other specified disorders of breast: Secondary | ICD-10-CM | POA: Diagnosis not present

## 2021-02-20 DIAGNOSIS — N6489 Other specified disorders of breast: Secondary | ICD-10-CM | POA: Diagnosis not present

## 2021-02-20 DIAGNOSIS — N6312 Unspecified lump in the right breast, upper inner quadrant: Secondary | ICD-10-CM | POA: Diagnosis not present

## 2021-03-05 ENCOUNTER — Encounter: Payer: Self-pay | Admitting: Oncology

## 2021-03-05 ENCOUNTER — Inpatient Hospital Stay: Payer: BC Managed Care – PPO | Attending: Oncology | Admitting: Oncology

## 2021-03-05 DIAGNOSIS — Z803 Family history of malignant neoplasm of breast: Secondary | ICD-10-CM | POA: Diagnosis not present

## 2021-03-05 DIAGNOSIS — D0512 Intraductal carcinoma in situ of left breast: Secondary | ICD-10-CM | POA: Diagnosis not present

## 2021-03-05 DIAGNOSIS — Z86000 Personal history of in-situ neoplasm of breast: Secondary | ICD-10-CM | POA: Diagnosis not present

## 2021-03-05 NOTE — Progress Notes (Signed)
Pt states that she has hematoma on left breast from bx pain 2 level

## 2021-03-08 NOTE — Progress Notes (Signed)
I connected with Kaylee Benson on 03/08/21 at  3:15 PM EDT by video enabled telemedicine visit and verified that I am speaking with the correct person using two identifiers.   I discussed the limitations, risks, security and privacy concerns of performing an evaluation and management service by telemedicine and the availability of in-person appointments. I also discussed with the patient that there may be a patient responsible charge related to this service. The patient expressed understanding and agreed to proceed.  Other persons participating in the visit and their role in the encounter:  none  Patient's location:  home Provider's location:  home  Chief Complaint: Discuss further management of DCIS  History of present illness: Patient is a 50year old female diagnosed with right breast dcis in 2016 s/p lumpectomy. She was started on tamoxifen after that but developed vaginal bleeding and chest pain following that. CT chest was negative for PE. She was also seen by Gyn and there was no evidence of endometrial cancer. She has remained off tamoxifen since then.Family history of breast cancer in her mother. She did undergo genetic testing and was negative.She also has b/l breast implants   Patient recently underwent Bilateral screening mammogram which showed possible masses in both the left and right breast.  Both the masses were biopsied.  Left breast mass biopsy was consistent with intermediate grade DCIS with comedonecrosis negative for invasive mammary carcinoma.  She had 2 right breast biopsies both of them showed no evidence of atypical proliferative disease or invasive carcinoma.  Pathology results forthcoming concordant  Interval history she has some baseline fatigue and occasional palpitations since she had COVID. Denies other complaints at this time   Review of Systems  Constitutional: Positive for malaise/fatigue. Negative for chills, fever and weight loss.  HENT: Negative for  congestion, ear discharge and nosebleeds.   Eyes: Negative for blurred vision.  Respiratory: Negative for cough, hemoptysis, sputum production, shortness of breath and wheezing.   Cardiovascular: Negative for chest pain, palpitations, orthopnea and claudication.  Gastrointestinal: Negative for abdominal pain, blood in stool, constipation, diarrhea, heartburn, melena, nausea and vomiting.  Genitourinary: Negative for dysuria, flank pain, frequency, hematuria and urgency.  Musculoskeletal: Negative for back pain, joint pain and myalgias.  Skin: Negative for rash.  Neurological: Negative for dizziness, tingling, focal weakness, seizures, weakness and headaches.  Endo/Heme/Allergies: Does not bruise/bleed easily.  Psychiatric/Behavioral: Negative for depression and suicidal ideas. The patient does not have insomnia.     No Known Allergies  Past Medical History:  Diagnosis Date  . Allergy    seasonal  . Breast cancer ([place]) right   radiation  . 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 Kidney stone   . Kidney stones   . Personal history of radiation therapy 2016   RIGHT lumpectomy  . PONV (postoperative nausea and vomiting)    nausea  . Sclerosis of sacroiliac joint     Past Surgical History:  Procedure Laterality Date  . AUGMENTATION MAMMAPLASTY    . BREAST BIOPSY Right 2016   LOW GRADE DUCTAL CARCINOMA IN SITU, 0.45 CM IN GREATEST DIMENSION  . BREAST BIOPSY Left 02/10/2021   Affirm bx of calcs  "Ribbon" clip-path pending  . BREAST CAPSULECTOMY WITH IMPLANT EXCHANGE Right 01/30/2015   Procedure: BREAST CAPSULECTOMY WITH IMPLANT EXCHANGE;  Surgeon: Bea Graff, MD;  Location: [facility];  Service: Plastics;  Laterality: Right;  . BREAST ENHANCEMENT SURGERY Bilateral   . BREAST LUMPECTOMY Right 2016   LOW GRADE DUCTAL CARCINOMA  IN SITU, 0.45 CM IN GREATEST DIMENSION  . BREAST LUMPECTOMY WITH NEEDLE LOCALIZATION Right 01/30/2015   Procedure: BREAST LUMPECTOMY WITH NEEDLE  LOCALIZATION;  Surgeon: Erroll Luna, MD;  Location: [facility];  Service: General;  Laterality: Right;  . BREAST SURGERY     bilateral breast implants 2001  . FOOT SURGERY Left   . LAPAROSCOPIC APPENDECTOMY N/A 11/02/2016   Procedure: APPENDECTOMY LAPAROSCOPIC;  Surgeon: Dia Crawford III, MD;  Location: [facility];  Service: General;  Laterality: N/A;  . MOUTH SURGERY    . ORIF ANKLE FRACTURE     left  . Surgery on left lower leg     2007    Social History   Socioeconomic History  . Marital status: Widowed    Spouse name: Not on file  . Number of children: Not on file  . Years of education: Not on file  . Highest education level: Not on file  Occupational History  . Not on file  Tobacco Use  . Smoking status: Never Smoker  . Smokeless tobacco: Never Used  Vaping Use  . Vaping Use: Never used  Substance and Sexual Activity  . Alcohol use: Yes    Comment: Drinks glass of wine 1-2 a month  . Drug use: No  . Sexual activity: Yes    Birth control/protection: Other-see comments    Comment: Essure procedure done 1 year ago, menarche age 50, G67, P 50, maternal age 37-25  Other Topics Concern  . Not on file  Social History Narrative  . Not on file   Social Determinants of Health   Financial Resource Strain: Not on file  Food Insecurity: Not on file  Transportation Needs: Not on file  Physical Activity: Not on file  Stress: Not on file  Social Connections: Not on file  Intimate Partner Violence: Not on file    Family History  Problem Relation Age of Onset  . Breast cancer Mother 40  . Heart disease Mother   . Hypertension Mother      Current Outpatient Medications:  .  aspirin 81 MG chewable tablet, Chew 81 mg by mouth daily., Disp: , Rfl:  .  BIOTIN PO, Take 1 tablet by mouth daily. Reported on 04/22/2016 10,018mg, Disp: , Rfl:  .  Cholecalciferol (VITAMIN D3) 25 MCG (1000 UT) CAPS, Take 1 capsule by mouth daily at 2 am., Disp: , Rfl:  .  fluticasone  (FLONASE) 50 MCG/ACT nasal spray, Place 2 sprays into both nostrils daily., Disp: 16 g, Rfl: 12 .  magnesium oxide (MAG-OX) 400 MG tablet, Take 400 mg by mouth 2 (two) times daily., Disp: , Rfl:  .  metoprolol succinate (TOPROL-XL) 25 MG 24 hr tablet, Take 1 tablet by mouth daily., Disp: , Rfl:  .  Multiple Vitamin (MULTIVITAMIN) tablet, Take 1 tablet by mouth daily., Disp: , Rfl:  .  omeprazole (PRILOSEC) 20 MG capsule, Take 20 mg by mouth 2 (two) times daily before a meal., Disp: , Rfl:  .  Zinc 50 MG TABS, Take 1 tablet by mouth daily at 12 noon., Disp: , Rfl:  .  Calcium Carbonate-Vitamin D (CALCIUM + D PO), Take by mouth once. (Patient not taking: Reported on 03/05/2021), Disp: , Rfl:  .  ibuprofen (ADVIL) 600 MG tablet, Take 1 tablet (600 mg total) by mouth every 8 (eight) hours as needed. (Patient not taking: Reported on 03/05/2021), Disp: 90 tablet, Rfl: 3 .  Misc Natural Products (OSTEO BI-FLEX ADV JOINT SHIELD) TABS, Take 1  tablet by mouth daily. (Patient not taking: Reported on 03/05/2021), Disp: , Rfl:  .  Omega-3 Fatty Acids (FISH OIL BURP-LESS) 1000 MG CAPS, Take 2 capsules by mouth daily. (Patient not taking: Reported on 03/05/2021), Disp: , Rfl:  .  Turmeric 500 MG TABS, Take 1 capsule by mouth daily.  (Patient not taking: No sig reported), Disp: , Rfl:   MM CLIP PLACEMENT LEFT  Result Date: 02/10/2021 CLINICAL DATA:  Status post ultrasound-guided core biopsies of the right breast (x2) and stereotactic biopsy of the left breast. EXAM: 3D DIAGNOSTIC BILATERAL MAMMOGRAM POST ULTRASOUND BIOPSIES OF THE RIGHT BREAST AND STEREOTACTIC BIOPSY OF THE LEFT BREAST COMPARISON:  Previous exam(s). FINDINGS: 3D Mammographic images were obtained following ultrasound-guided core biopsies of the right breast and stereotactic biopsy of the left breast. In the right breast there is a Vision shaped clip in the 11 o'clock region of the right breast and a Q shaped clip in the 12 o'clock region of the right  breast. The ribbon shaped clip in the upper-outer quadrant of the left breast is located 9 mm anterior to the biopsied calcifications. There are residual calcifications that can be use for localization purposes. IMPRESSION: Appropriate positioning of the vision shaped clip in the 11 o'clock region of the right breast and the Q shaped clip in the 12 o'clock region of the right breast. The ribbon shaped clip in the upper-outer quadrant of the left breast is located approximately 9 mm anterior to the biopsied calcifications. Residual calcifications can be use for localization purposes. Final Assessment: Post Procedure Mammograms for Marker Placement Electronically Signed   By: Lillia Mountain M.D.   On: 02/10/2021 11:30   MM CLIP PLACEMENT RIGHT  Result Date: 02/10/2021 CLINICAL DATA:  Status post ultrasound-guided core biopsies of the right breast (x2) and stereotactic biopsy of the left breast. EXAM: 3D DIAGNOSTIC BILATERAL MAMMOGRAM POST ULTRASOUND BIOPSIES OF THE RIGHT BREAST AND STEREOTACTIC BIOPSY OF THE LEFT BREAST COMPARISON:  Previous exam(s). FINDINGS: 3D Mammographic images were obtained following ultrasound-guided core biopsies of the right breast and stereotactic biopsy of the left breast. In the right breast there is a Vision shaped clip in the 11 o'clock region of the right breast and a Q shaped clip in the 12 o'clock region of the right breast. The ribbon shaped clip in the upper-outer quadrant of the left breast is located 9 mm anterior to the biopsied calcifications. There are residual calcifications that can be use for localization purposes. IMPRESSION: Appropriate positioning of the vision shaped clip in the 11 o'clock region of the right breast and the Q shaped clip in the 12 o'clock region of the right breast. The ribbon shaped clip in the upper-outer quadrant of the left breast is located approximately 9 mm anterior to the biopsied calcifications. Residual calcifications can be use for  localization purposes. Final Assessment: Post Procedure Mammograms for Marker Placement Electronically Signed   By: Lillia Mountain M.D.   On: 02/10/2021 11:30   MM LT BREAST BX W LOC DEV 1ST LESION IMAGE BX SPEC STEREO GUIDE  Addendum Date: 02/12/2021   ADDENDUM REPORT: 02/12/2021 13:01 ADDENDUM: PATHOLOGY revealed: Site A. BREAST, LEFT UPPER OUTER QUADRANT; STEREOTACTIC CORE NEEDLE BIOPSY: - DUCTAL CARCINOMA IN SITU, INTERMEDIATE GRADE, WITH COMEDONECROSIS AND ASSOCIATED CALCIFICATIONS. - BACKGROUND FIBROCYSTIC AND APOCRINE CHANGES. - NEGATIVE FOR INVASIVE CARCINOMA. Comment: DCIS is present in 1 of 7 tissue blocks, and spans 4 mm in greatest linear extent. Pathology results are CONCORDANT with imaging findings, per Dr. Mordecai Rasmussen  [place]. PATHOLOGY revealed: Site B. BREAST, RIGHT AT 11:00, 1 CM FROM THE NIPPLE; ULTRASOUND-GUIDED CORE NEEDLE BIOPSY: - FIBROEPITHELIAL PROLIFERATION WITH SCLEROSIS AND FOCAL ADENOSIS. - NEGATIVE FOR ATYPICAL PROLIFERATIVE BREAST DISEASE IN THIS SAMPLE. Comment: Diagnostic considerations include (but are not limited to) a complex fibroadenoma, intraductal papilloma with sclerosis, or complex sclerosing lesion. Although atypia is not present in this biopsy, it cannot be excluded elsewhere in this lesion. Due to the appearance of adenosis within this lesion, immunohistochemical studies directed against myoepithelial markers p63 and calponin were performed, and highlight an intact myoepithelial cell layer. There is no evidence of invasive carcinoma. Correlation with radiographic findings is required. Pathology results are CONCORDANT with imaging findings, per Dr. Lillia Mountain. PATHOLOGY revealed: Site C. BREAST, RIGHT AT 12:00, 1 CM FROM THE NIPPLE; ULTRASOUND-GUIDED CORE NEEDLE BIOPSY: - BENIGN MAMMARY PARENCHYMA WITH DENSE STROMAL FIBROSIS, FIBROCYSTIC CHANGES, AND ASSOCIATED MICROCALCIFICATIONS. - NEGATIVE FOR ATYPICAL PROLIFERATIVE BREAST DISEASE. Pathology results are CONCORDANT with imaging  findings, per Dr. Lillia Mountain with consideration for excision. Pathology results and recommendations below were discussed with patient by telephone on 02/12/2021. Patient reported biopsy site within normal limits with slight tenderness at the site. Post biopsy care instructions were reviewed, questions were answered and my direct phone number was provided to patient. Patient was instructed to call [facility] if any concerns or questions arise related to the biopsy. Recommendations: 1. Surgical consultation for all three biopsied masses listed above: Request for surgical consultation relayed to Al Pimple RN and Tanya Nones RN at [facility] by Electa Sniff RN on 02/11/2021. Patient has appointments scheduled to see surgeon (Dr. Ollen Bowl) and oncologist (Dr. Randa Evens) both on Monday 02/17/2021. 2. Consider a bilateral breast MRI given patient's breast density and bilateral breast implants. Pathology results reported by Electa Sniff RN on 02/12/2021. Electronically Signed   By: Lillia Mountain M.D.   On: 02/12/2021 13:01   Result Date: 02/12/2021 CLINICAL DATA:  Left breast calcifications. EXAM: RIGHT BREAST STEREOTACTIC CORE NEEDLE BIOPSY COMPARISON:  Previous exams. FINDINGS: The patient and I discussed the procedure of stereotactic-guided biopsy including benefits and alternatives. We discussed the high likelihood of a successful procedure. We discussed the risks of the procedure including infection, bleeding, tissue injury, clip migration, and inadequate sampling. Informed written consent was given. The usual time out protocol was performed immediately prior to the procedure. Using sterile technique and 1% lidocaine and 1% lidocaine with epinephrine as local anesthetic, under stereotactic guidance, a 9 gauge vacuum assisted device was used to perform core needle biopsy of calcifications in the upper-outer quadrant of left breast using a superior to inferior approach. Specimen  radiograph was performed showing calcifications are present in the tissue samples. Specimens with calcifications are identified for pathology. Lesion quadrant: Upper-outer quadrant At the conclusion of the procedure, ribbon shaped tissue marker clip was deployed into the biopsy cavity. Follow-up 2-view mammogram was performed and dictated separately. IMPRESSION: Stereotactic-guided biopsy of the left breast. No apparent complications. Electronically Signed: By: Lillia Mountain M.D. On: 02/10/2021 11:05   [place] RT BREAST BX W LOC DEV 1ST LESION IMG BX SPEC US GUIDE  Addendum Date: 02/12/2021   ADDENDUM REPORT: 02/12/2021 12:59 ADDENDUM: PATHOLOGY revealed: Site A. BREAST, LEFT UPPER OUTER QUADRANT; STEREOTACTIC CORE NEEDLE BIOPSY: - DUCTAL CARCINOMA IN SITU, INTERMEDIATE GRADE, WITH COMEDONECROSIS AND ASSOCIATED CALCIFICATIONS. - BACKGROUND FIBROCYSTIC AND APOCRINE CHANGES. - NEGATIVE FOR INVASIVE CARCINOMA. Comment: DCIS is present in 1 of 7 tissue blocks, and spans 4 mm  in greatest linear extent. Pathology results are CONCORDANT with imaging findings, per Dr. Lillia Mountain. PATHOLOGY revealed: Site B. BREAST, RIGHT AT 11:00, 1 CM FROM THE NIPPLE; ULTRASOUND-GUIDED CORE NEEDLE BIOPSY: - FIBROEPITHELIAL PROLIFERATION WITH SCLEROSIS AND FOCAL ADENOSIS. - NEGATIVE FOR ATYPICAL PROLIFERATIVE BREAST DISEASE IN THIS SAMPLE. Comment: Diagnostic considerations include (but are not limited to) a complex fibroadenoma, intraductal papilloma with sclerosis, or complex sclerosing lesion. Although atypia is not present in this biopsy, it cannot be excluded elsewhere in this lesion. Due to the appearance of adenosis within this lesion, immunohistochemical studies directed against myoepithelial markers p63 and calponin were performed, and highlight an intact myoepithelial cell layer. There is no evidence of invasive carcinoma. Correlation with radiographic findings is required. Pathology results are CONCORDANT with imaging findings, per  Dr. Lillia Mountain. PATHOLOGY revealed: Site C. BREAST, RIGHT AT 12:00, 1 CM FROM THE NIPPLE; ULTRASOUND-GUIDED CORE NEEDLE BIOPSY: - BENIGN MAMMARY PARENCHYMA WITH DENSE STROMAL FIBROSIS, FIBROCYSTIC CHANGES, AND ASSOCIATED MICROCALCIFICATIONS. - NEGATIVE FOR ATYPICAL PROLIFERATIVE BREAST DISEASE. Pathology results are CONCORDANT with imaging findings, per Dr. Lillia Mountain with consideration for excision. Pathology results and recommendations below were discussed with patient by telephone on 02/12/2021. Patient reported biopsy site within normal limits with slight tenderness at the site. Post biopsy care instructions were reviewed, questions were answered and my direct phone number was provided to patient. Patient was instructed to call [facility] if any concerns or questions arise related to the biopsy. Recommendations: 1. Surgical consultation for all three biopsied masses listed above: Request for surgical consultation relayed to Al Pimple RN and Tanya Nones RN at [facility] by Electa Sniff RN on 02/11/2021. Patient has appointments scheduled to see surgeon (Dr. Ollen Bowl) and oncologist (Dr. Randa Evens) both on Monday 02/17/2021. 2. Consider a bilateral breast MRI given patient's breast density and bilateral breast implants. Pathology results reported by Electa Sniff RN on 02/12/2021. Electronically Signed   By: Lillia Mountain M.D.   On: 02/12/2021 12:59   Result Date: 02/12/2021 CLINICAL DATA:  Two indeterminate masses in the 11 o'clock region of the right breast 1 cm from the nipple and 12 o'clock region of the right breast 1 cm from the nipple (on the patient's ultrasound dated 01/31/2021 both masses were labeled at 12 o'clock). EXAM: ULTRASOUND GUIDED RIGHT BREAST CORE NEEDLE BIOPSY COMPARISON:  Previous exam(s). PROCEDURE: I met with the patient and we discussed the procedure of ultrasound-guided biopsy, including benefits and alternatives. We discussed the high likelihood of  a successful procedure. We discussed the risks of the procedure, including infection, bleeding, tissue injury, clip migration, and inadequate sampling. Informed written consent was given. The usual time-out protocol was performed immediately prior to the procedure. Lesion quadrant: (11 O'CLOCK 1 CM FROM THE NIPPLE Using sterile technique and 1% lidocaine and 1% lidocaine with epinephrine as local anesthetic, under direct ultrasound visualization, a 14 gauge spring-loaded device was used to perform biopsy of A MASS IN THE 11 O'CLOCK REGION OF THE RIGHT BREAST using a lateral to medial approach. At the conclusion of the procedure vision shaped tissue marker clip was deployed into the biopsy cavity. Follow up 2 view mammogram was performed and dictated separately. I met with the patient and we discussed the procedure of ultrasound-guided biopsy, including benefits and alternatives. We discussed the high likelihood of a successful procedure. We discussed the risks of the procedure, including infection, bleeding, tissue injury, clip migration, and inadequate sampling. Informed written consent was given. The usual time-out  protocol was performed immediately prior to the procedure. Lesion quadrant: 12 O'CLOCK 1 CM FROM THE NIPPLE Using sterile technique and 1% lidocaine and 1% lidocaine with epinephrine as local anesthetic, under direct ultrasound visualization, a 14 gauge spring-loaded device was used to perform biopsy of A MASS IN THE 12 O'CLOCK REGION OF THE RIGHT BREAST using a lateral to medial approach. At the conclusion of the procedure Q shaped tissue marker clip was deployed into the biopsy cavity. Follow up 2 view mammogram was performed and dictated separately. IMPRESSION: Ultrasound biopsies of the right breast.  No apparent complications. Electronically Signed: By: Lillia Mountain M.D. On: 02/10/2021 10:30   [place] RT BREAST BX W LOC DEV EA ADD LESION IMG BX SPEC US GUIDE  Addendum Date: 02/12/2021   ADDENDUM  REPORT: 02/12/2021 12:59 ADDENDUM: PATHOLOGY revealed: Site A. BREAST, LEFT UPPER OUTER QUADRANT; STEREOTACTIC CORE NEEDLE BIOPSY: - DUCTAL CARCINOMA IN SITU, INTERMEDIATE GRADE, WITH COMEDONECROSIS AND ASSOCIATED CALCIFICATIONS. - BACKGROUND FIBROCYSTIC AND APOCRINE CHANGES. - NEGATIVE FOR INVASIVE CARCINOMA. Comment: DCIS is present in 1 of 7 tissue blocks, and spans 4 mm in greatest linear extent. Pathology results are CONCORDANT with imaging findings, per Dr. Lillia Mountain. PATHOLOGY revealed: Site B. BREAST, RIGHT AT 11:00, 1 CM FROM THE NIPPLE; ULTRASOUND-GUIDED CORE NEEDLE BIOPSY: - FIBROEPITHELIAL PROLIFERATION WITH SCLEROSIS AND FOCAL ADENOSIS. - NEGATIVE FOR ATYPICAL PROLIFERATIVE BREAST DISEASE IN THIS SAMPLE. Comment: Diagnostic considerations include (but are not limited to) a complex fibroadenoma, intraductal papilloma with sclerosis, or complex sclerosing lesion. Although atypia is not present in this biopsy, it cannot be excluded elsewhere in this lesion. Due to the appearance of adenosis within this lesion, immunohistochemical studies directed against myoepithelial markers p63 and calponin were performed, and highlight an intact myoepithelial cell layer. There is no evidence of invasive carcinoma. Correlation with radiographic findings is required. Pathology results are CONCORDANT with imaging findings, per Dr. Lillia Mountain. PATHOLOGY revealed: Site C. BREAST, RIGHT AT 12:00, 1 CM FROM THE NIPPLE; ULTRASOUND-GUIDED CORE NEEDLE BIOPSY: - BENIGN MAMMARY PARENCHYMA WITH DENSE STROMAL FIBROSIS, FIBROCYSTIC CHANGES, AND ASSOCIATED MICROCALCIFICATIONS. - NEGATIVE FOR ATYPICAL PROLIFERATIVE BREAST DISEASE. Pathology results are CONCORDANT with imaging findings, per Dr. Lillia Mountain with consideration for excision. Pathology results and recommendations below were discussed with patient by telephone on 02/12/2021. Patient reported biopsy site within normal limits with slight tenderness at the site. Post biopsy care  instructions were reviewed, questions were answered and my direct phone number was provided to patient. Patient was instructed to call [facility] if any concerns or questions arise related to the biopsy. Recommendations: 1. Surgical consultation for all three biopsied masses listed above: Request for surgical consultation relayed to Al Pimple RN and Tanya Nones RN at [facility] by Electa Sniff RN on 02/11/2021. Patient has appointments scheduled to see surgeon (Dr. Ollen Bowl) and oncologist (Dr. Randa Evens) both on Monday 02/17/2021. 2. Consider a bilateral breast MRI given patient's breast density and bilateral breast implants. Pathology results reported by Electa Sniff RN on 02/12/2021. Electronically Signed   By: Lillia Mountain M.D.   On: 02/12/2021 12:59   Result Date: 02/12/2021 CLINICAL DATA:  Two indeterminate masses in the 11 o'clock region of the right breast 1 cm from the nipple and 12 o'clock region of the right breast 1 cm from the nipple (on the patient's ultrasound dated 01/31/2021 both masses were labeled at 12 o'clock). EXAM: ULTRASOUND GUIDED RIGHT BREAST CORE NEEDLE BIOPSY COMPARISON:  Previous exam(s). PROCEDURE: I met with  the patient and we discussed the procedure of ultrasound-guided biopsy, including benefits and alternatives. We discussed the high likelihood of a successful procedure. We discussed the risks of the procedure, including infection, bleeding, tissue injury, clip migration, and inadequate sampling. Informed written consent was given. The usual time-out protocol was performed immediately prior to the procedure. Lesion quadrant: (11 O'CLOCK 1 CM FROM THE NIPPLE Using sterile technique and 1% lidocaine and 1% lidocaine with epinephrine as local anesthetic, under direct ultrasound visualization, a 14 gauge spring-loaded device was used to perform biopsy of A MASS IN THE 11 O'CLOCK REGION OF THE RIGHT BREAST using a lateral to medial approach. At  the conclusion of the procedure vision shaped tissue marker clip was deployed into the biopsy cavity. Follow up 2 view mammogram was performed and dictated separately. I met with the patient and we discussed the procedure of ultrasound-guided biopsy, including benefits and alternatives. We discussed the high likelihood of a successful procedure. We discussed the risks of the procedure, including infection, bleeding, tissue injury, clip migration, and inadequate sampling. Informed written consent was given. The usual time-out protocol was performed immediately prior to the procedure. Lesion quadrant: 12 O'CLOCK 1 CM FROM THE NIPPLE Using sterile technique and 1% lidocaine and 1% lidocaine with epinephrine as local anesthetic, under direct ultrasound visualization, a 14 gauge spring-loaded device was used to perform biopsy of A MASS IN THE 12 O'CLOCK REGION OF THE RIGHT BREAST using a lateral to medial approach. At the conclusion of the procedure Q shaped tissue marker clip was deployed into the biopsy cavity. Follow up 2 view mammogram was performed and dictated separately. IMPRESSION: Ultrasound biopsies of the right breast.  No apparent complications. Electronically Signed: By: Lillia Mountain M.D. On: 02/10/2021 10:30    No images are attached to the encounter.   CMP Latest Ref Rng & Units 05/08/2020  Glucose 65 - 99 mg/dL 76  BUN 6 - 24 mg/dL 24  Creatinine 0.57 - 1.00 mg/dL 0.88  Sodium 134 - 144 mmol/L 137  Potassium 3.5 - 5.2 mmol/L 4.5  Chloride 96 - 106 mmol/L 99  CO2 20 - 29 mmol/L 23  Calcium 8.7 - 10.2 mg/dL 9.7  Total Protein 6.0 - 8.5 g/dL 7.2  Total Bilirubin 0.0 - 1.2 mg/dL 0.3  Alkaline Phos 48 - 121 IU/L 54  AST 0 - 40 IU/L 20  ALT 0 - 32 IU/L 13   CBC Latest Ref Rng & Units 05/08/2020  WBC 3.4 - 10.8 x10E3/uL 5.0  Hemoglobin 11.1 - 15.9 g/dL 11.3  Hematocrit 34.0 - 46.6 % 37.0  Platelets 150 - 450 x10E3/uL 270     Observation/objective:appears in no acute distress over video  visit today. Breathing is non labored  Assessment and plan: Patient is a 50 year old female with a prior history of right breast DCIS s/p lumpectomy and adjuvant radiation and now with left breast DCIS here to discuss further management  I discussed the consensus from tumor board this week.  Patient had 2 right breast biopsies one at the 11 o'clock position and one at the 12 o'clock position.The 12:00 biopsy sample only showed benign parenchyma with dense stromal fibrosis the 11:00 biopsy however showed a fibroepithelial proliferation with sclerosis and focal adenosis both radiology and pathology feels that since this was a small sample they cannot rule out a complex sclerosing lesion in other parts in that area and therefore an excision is warranted.  I have discussed this with the patient as well as Dr.  Byrnett.  Dr. Bary Castilla is of the opinion that instead of performing lumpectomy a vacuum procedure can be attempted to get more sample instead of putting the patient through a formal lumpectomy.  I have mentioned this to the patient and she will discuss this further with Dr. Bary Castilla.  We also discussed that patient would need a left lumpectomy regardless.  Patient is now willing to move forward with surgery and we will discuss this further with Dr. Bary Castilla.  From my standpoint for her ER positive DCIS I would recommend low-dose tamoxifen 10 mg and see if she can tolerate that since patient had a problem tolerating 20 mg in the past.  Given that this is her second DCIS and patient is young if she is unable to tolerate tamoxifen I will consider OS plus AI for her.  Patient verbalized understanding of the plan.  Her further follow-up with me will depend on final pathology results  Follow-up instructions:to be decided  I discussed the assessment and treatment plan with the patient. The patient was provided an opportunity to ask questions and all were answered. The patient agreed with the plan and demonstrated  an understanding of the instructions.   The patient was advised to call back or seek an in-person evaluation if the symptoms worsen or if the condition fails to improve as anticipated.  Visit Diagnosis: 1. Ductal carcinoma in situ (DCIS) of left breast     Dr. Randa Evens, MD, MPH [facility] Tel- [phone] 03/08/2021 4:56 PM

## 2021-03-12 DIAGNOSIS — R590 Localized enlarged lymph nodes: Secondary | ICD-10-CM | POA: Diagnosis not present

## 2021-03-12 DIAGNOSIS — Z1239 Encounter for other screening for malignant neoplasm of breast: Secondary | ICD-10-CM | POA: Diagnosis not present

## 2021-03-12 DIAGNOSIS — D0512 Intraductal carcinoma in situ of left breast: Secondary | ICD-10-CM | POA: Diagnosis not present

## 2021-03-12 DIAGNOSIS — Z86000 Personal history of in-situ neoplasm of breast: Secondary | ICD-10-CM | POA: Diagnosis not present

## 2021-03-12 DIAGNOSIS — Z803 Family history of malignant neoplasm of breast: Secondary | ICD-10-CM | POA: Diagnosis not present

## 2021-03-17 DIAGNOSIS — R590 Localized enlarged lymph nodes: Secondary | ICD-10-CM | POA: Diagnosis not present

## 2021-03-17 DIAGNOSIS — D0512 Intraductal carcinoma in situ of left breast: Secondary | ICD-10-CM | POA: Diagnosis not present

## 2021-03-17 DIAGNOSIS — R928 Other abnormal and inconclusive findings on diagnostic imaging of breast: Secondary | ICD-10-CM | POA: Diagnosis not present

## 2021-03-20 DIAGNOSIS — F439 Reaction to severe stress, unspecified: Secondary | ICD-10-CM | POA: Diagnosis not present

## 2021-03-24 DIAGNOSIS — Z6822 Body mass index (BMI) 22.0-22.9, adult: Secondary | ICD-10-CM | POA: Diagnosis not present

## 2021-03-24 DIAGNOSIS — Z01419 Encounter for gynecological examination (general) (routine) without abnormal findings: Secondary | ICD-10-CM | POA: Diagnosis not present

## 2021-04-11 DIAGNOSIS — I5189 Other ill-defined heart diseases: Secondary | ICD-10-CM | POA: Diagnosis not present

## 2021-04-11 DIAGNOSIS — I447 Left bundle-branch block, unspecified: Secondary | ICD-10-CM | POA: Diagnosis not present

## 2021-04-11 DIAGNOSIS — U071 COVID-19: Secondary | ICD-10-CM | POA: Diagnosis not present

## 2021-04-11 DIAGNOSIS — R002 Palpitations: Secondary | ICD-10-CM | POA: Diagnosis not present

## 2021-04-14 DIAGNOSIS — Z0000 Encounter for general adult medical examination without abnormal findings: Secondary | ICD-10-CM | POA: Diagnosis not present

## 2021-04-14 DIAGNOSIS — E559 Vitamin D deficiency, unspecified: Secondary | ICD-10-CM | POA: Diagnosis not present

## 2021-04-14 DIAGNOSIS — K2100 Gastro-esophageal reflux disease with esophagitis, without bleeding: Secondary | ICD-10-CM | POA: Diagnosis not present

## 2021-04-14 DIAGNOSIS — R002 Palpitations: Secondary | ICD-10-CM | POA: Diagnosis not present

## 2021-04-15 DIAGNOSIS — F439 Reaction to severe stress, unspecified: Secondary | ICD-10-CM | POA: Diagnosis not present

## 2021-04-24 DIAGNOSIS — I5189 Other ill-defined heart diseases: Secondary | ICD-10-CM | POA: Diagnosis not present

## 2021-04-24 DIAGNOSIS — M1990 Unspecified osteoarthritis, unspecified site: Secondary | ICD-10-CM | POA: Diagnosis not present

## 2021-04-24 DIAGNOSIS — N189 Chronic kidney disease, unspecified: Secondary | ICD-10-CM | POA: Diagnosis not present

## 2021-04-24 DIAGNOSIS — I447 Left bundle-branch block, unspecified: Secondary | ICD-10-CM | POA: Diagnosis not present

## 2021-04-24 DIAGNOSIS — D0511 Intraductal carcinoma in situ of right breast: Secondary | ICD-10-CM | POA: Diagnosis not present

## 2021-04-24 DIAGNOSIS — R112 Nausea with vomiting, unspecified: Secondary | ICD-10-CM | POA: Diagnosis not present

## 2021-04-24 DIAGNOSIS — N6310 Unspecified lump in the right breast, unspecified quadrant: Secondary | ICD-10-CM | POA: Diagnosis not present

## 2021-04-24 DIAGNOSIS — Z9889 Other specified postprocedural states: Secondary | ICD-10-CM | POA: Diagnosis not present

## 2021-04-24 DIAGNOSIS — E559 Vitamin D deficiency, unspecified: Secondary | ICD-10-CM | POA: Diagnosis not present

## 2021-04-24 DIAGNOSIS — Z8616 Personal history of COVID-19: Secondary | ICD-10-CM | POA: Diagnosis not present

## 2021-04-24 DIAGNOSIS — Z01818 Encounter for other preprocedural examination: Secondary | ICD-10-CM | POA: Diagnosis not present

## 2021-04-24 DIAGNOSIS — R921 Mammographic calcification found on diagnostic imaging of breast: Secondary | ICD-10-CM | POA: Diagnosis not present

## 2021-04-29 DIAGNOSIS — D485 Neoplasm of uncertain behavior of skin: Secondary | ICD-10-CM | POA: Diagnosis not present

## 2021-04-29 DIAGNOSIS — L209 Atopic dermatitis, unspecified: Secondary | ICD-10-CM | POA: Diagnosis not present

## 2021-04-29 DIAGNOSIS — L570 Actinic keratosis: Secondary | ICD-10-CM | POA: Diagnosis not present

## 2021-05-01 DIAGNOSIS — N6489 Other specified disorders of breast: Secondary | ICD-10-CM | POA: Diagnosis not present

## 2021-05-01 DIAGNOSIS — D0511 Intraductal carcinoma in situ of right breast: Secondary | ICD-10-CM | POA: Diagnosis not present

## 2021-05-01 DIAGNOSIS — C50912 Malignant neoplasm of unspecified site of left female breast: Secondary | ICD-10-CM | POA: Diagnosis not present

## 2021-05-01 DIAGNOSIS — Z9882 Breast implant status: Secondary | ICD-10-CM | POA: Diagnosis not present

## 2021-05-01 DIAGNOSIS — Z79899 Other long term (current) drug therapy: Secondary | ICD-10-CM | POA: Diagnosis not present

## 2021-05-01 DIAGNOSIS — N641 Fat necrosis of breast: Secondary | ICD-10-CM | POA: Diagnosis not present

## 2021-05-01 DIAGNOSIS — N189 Chronic kidney disease, unspecified: Secondary | ICD-10-CM | POA: Diagnosis not present

## 2021-05-01 DIAGNOSIS — Z923 Personal history of irradiation: Secondary | ICD-10-CM | POA: Diagnosis not present

## 2021-05-01 DIAGNOSIS — D242 Benign neoplasm of left breast: Secondary | ICD-10-CM | POA: Diagnosis not present

## 2021-05-01 DIAGNOSIS — I447 Left bundle-branch block, unspecified: Secondary | ICD-10-CM | POA: Diagnosis not present

## 2021-05-01 DIAGNOSIS — Z7982 Long term (current) use of aspirin: Secondary | ICD-10-CM | POA: Diagnosis not present

## 2021-05-01 DIAGNOSIS — Z170 Estrogen receptor positive status [ER+]: Secondary | ICD-10-CM | POA: Diagnosis not present

## 2021-05-01 DIAGNOSIS — Z8616 Personal history of COVID-19: Secondary | ICD-10-CM | POA: Diagnosis not present

## 2021-05-01 DIAGNOSIS — M1990 Unspecified osteoarthritis, unspecified site: Secondary | ICD-10-CM | POA: Diagnosis not present

## 2021-05-01 DIAGNOSIS — R002 Palpitations: Secondary | ICD-10-CM | POA: Diagnosis not present

## 2021-05-01 DIAGNOSIS — R921 Mammographic calcification found on diagnostic imaging of breast: Secondary | ICD-10-CM | POA: Diagnosis not present

## 2021-05-01 DIAGNOSIS — E559 Vitamin D deficiency, unspecified: Secondary | ICD-10-CM | POA: Diagnosis not present

## 2021-05-01 DIAGNOSIS — D0512 Intraductal carcinoma in situ of left breast: Secondary | ICD-10-CM | POA: Diagnosis not present

## 2021-05-07 DIAGNOSIS — F439 Reaction to severe stress, unspecified: Secondary | ICD-10-CM | POA: Diagnosis not present

## 2021-05-14 ENCOUNTER — Encounter: Payer: Self-pay | Admitting: Family Medicine

## 2021-05-14 DIAGNOSIS — C50112 Malignant neoplasm of central portion of left female breast: Secondary | ICD-10-CM | POA: Diagnosis not present

## 2021-05-14 DIAGNOSIS — D0512 Intraductal carcinoma in situ of left breast: Secondary | ICD-10-CM | POA: Diagnosis not present

## 2021-05-14 DIAGNOSIS — D0511 Intraductal carcinoma in situ of right breast: Secondary | ICD-10-CM | POA: Diagnosis not present

## 2021-05-14 DIAGNOSIS — D242 Benign neoplasm of left breast: Secondary | ICD-10-CM | POA: Diagnosis not present

## 2021-05-14 DIAGNOSIS — Z1371 Encounter for nonprocreative screening for genetic disease carrier status: Secondary | ICD-10-CM | POA: Diagnosis not present

## 2021-05-14 DIAGNOSIS — Z170 Estrogen receptor positive status [ER+]: Secondary | ICD-10-CM | POA: Diagnosis not present

## 2021-05-14 DIAGNOSIS — C50912 Malignant neoplasm of unspecified site of left female breast: Secondary | ICD-10-CM | POA: Diagnosis not present

## 2021-05-20 DIAGNOSIS — Z79899 Other long term (current) drug therapy: Secondary | ICD-10-CM | POA: Diagnosis not present

## 2021-05-20 DIAGNOSIS — T753XXD Motion sickness, subsequent encounter: Secondary | ICD-10-CM | POA: Diagnosis not present

## 2021-05-20 DIAGNOSIS — Z86000 Personal history of in-situ neoplasm of breast: Secondary | ICD-10-CM | POA: Diagnosis not present

## 2021-05-20 DIAGNOSIS — X58XXXD Exposure to other specified factors, subsequent encounter: Secondary | ICD-10-CM | POA: Diagnosis not present

## 2021-05-20 DIAGNOSIS — N6022 Fibroadenosis of left breast: Secondary | ICD-10-CM | POA: Diagnosis not present

## 2021-05-20 DIAGNOSIS — I5189 Other ill-defined heart diseases: Secondary | ICD-10-CM | POA: Diagnosis not present

## 2021-05-20 DIAGNOSIS — N6092 Unspecified benign mammary dysplasia of left breast: Secondary | ICD-10-CM | POA: Diagnosis not present

## 2021-05-20 DIAGNOSIS — R112 Nausea with vomiting, unspecified: Secondary | ICD-10-CM | POA: Diagnosis not present

## 2021-05-20 DIAGNOSIS — N62 Hypertrophy of breast: Secondary | ICD-10-CM | POA: Diagnosis not present

## 2021-05-20 DIAGNOSIS — I447 Left bundle-branch block, unspecified: Secondary | ICD-10-CM | POA: Diagnosis not present

## 2021-05-20 DIAGNOSIS — D0512 Intraductal carcinoma in situ of left breast: Secondary | ICD-10-CM | POA: Diagnosis not present

## 2021-05-20 DIAGNOSIS — Z9889 Other specified postprocedural states: Secondary | ICD-10-CM | POA: Diagnosis not present

## 2021-05-20 DIAGNOSIS — E559 Vitamin D deficiency, unspecified: Secondary | ICD-10-CM | POA: Diagnosis not present

## 2021-05-20 DIAGNOSIS — Z8616 Personal history of COVID-19: Secondary | ICD-10-CM | POA: Diagnosis not present

## 2021-05-20 DIAGNOSIS — N641 Fat necrosis of breast: Secondary | ICD-10-CM | POA: Diagnosis not present

## 2021-06-04 DIAGNOSIS — C50112 Malignant neoplasm of central portion of left female breast: Secondary | ICD-10-CM | POA: Diagnosis not present

## 2021-06-04 DIAGNOSIS — R238 Other skin changes: Secondary | ICD-10-CM | POA: Diagnosis not present

## 2021-06-04 DIAGNOSIS — D0511 Intraductal carcinoma in situ of right breast: Secondary | ICD-10-CM | POA: Diagnosis not present

## 2021-06-04 DIAGNOSIS — D0512 Intraductal carcinoma in situ of left breast: Secondary | ICD-10-CM | POA: Diagnosis not present

## 2021-06-04 DIAGNOSIS — Z411 Encounter for cosmetic surgery: Secondary | ICD-10-CM | POA: Diagnosis not present

## 2021-06-04 DIAGNOSIS — Z170 Estrogen receptor positive status [ER+]: Secondary | ICD-10-CM | POA: Diagnosis not present

## 2021-06-18 DIAGNOSIS — F439 Reaction to severe stress, unspecified: Secondary | ICD-10-CM | POA: Diagnosis not present

## 2021-06-20 DIAGNOSIS — D0512 Intraductal carcinoma in situ of left breast: Secondary | ICD-10-CM | POA: Diagnosis not present

## 2021-06-20 DIAGNOSIS — Z170 Estrogen receptor positive status [ER+]: Secondary | ICD-10-CM | POA: Diagnosis not present

## 2021-06-20 DIAGNOSIS — C50112 Malignant neoplasm of central portion of left female breast: Secondary | ICD-10-CM | POA: Diagnosis not present

## 2021-07-01 DIAGNOSIS — D0512 Intraductal carcinoma in situ of left breast: Secondary | ICD-10-CM | POA: Diagnosis not present

## 2021-07-02 DIAGNOSIS — C50112 Malignant neoplasm of central portion of left female breast: Secondary | ICD-10-CM | POA: Diagnosis not present

## 2021-07-02 DIAGNOSIS — D0512 Intraductal carcinoma in situ of left breast: Secondary | ICD-10-CM | POA: Diagnosis not present

## 2021-07-03 DIAGNOSIS — C50112 Malignant neoplasm of central portion of left female breast: Secondary | ICD-10-CM | POA: Diagnosis not present

## 2021-07-03 DIAGNOSIS — D0512 Intraductal carcinoma in situ of left breast: Secondary | ICD-10-CM | POA: Diagnosis not present

## 2021-07-04 DIAGNOSIS — D0512 Intraductal carcinoma in situ of left breast: Secondary | ICD-10-CM | POA: Diagnosis not present

## 2021-07-04 DIAGNOSIS — C50112 Malignant neoplasm of central portion of left female breast: Secondary | ICD-10-CM | POA: Diagnosis not present

## 2021-07-07 DIAGNOSIS — D0512 Intraductal carcinoma in situ of left breast: Secondary | ICD-10-CM | POA: Diagnosis not present

## 2021-07-07 DIAGNOSIS — C50112 Malignant neoplasm of central portion of left female breast: Secondary | ICD-10-CM | POA: Diagnosis not present

## 2021-07-08 DIAGNOSIS — C50112 Malignant neoplasm of central portion of left female breast: Secondary | ICD-10-CM | POA: Diagnosis not present

## 2021-07-08 DIAGNOSIS — D0512 Intraductal carcinoma in situ of left breast: Secondary | ICD-10-CM | POA: Diagnosis not present

## 2021-07-09 DIAGNOSIS — C50112 Malignant neoplasm of central portion of left female breast: Secondary | ICD-10-CM | POA: Diagnosis not present

## 2021-07-09 DIAGNOSIS — D0512 Intraductal carcinoma in situ of left breast: Secondary | ICD-10-CM | POA: Diagnosis not present

## 2021-07-10 DIAGNOSIS — D0512 Intraductal carcinoma in situ of left breast: Secondary | ICD-10-CM | POA: Diagnosis not present

## 2021-07-10 DIAGNOSIS — C50112 Malignant neoplasm of central portion of left female breast: Secondary | ICD-10-CM | POA: Diagnosis not present

## 2021-07-11 DIAGNOSIS — D0512 Intraductal carcinoma in situ of left breast: Secondary | ICD-10-CM | POA: Diagnosis not present

## 2021-07-11 DIAGNOSIS — C50112 Malignant neoplasm of central portion of left female breast: Secondary | ICD-10-CM | POA: Diagnosis not present

## 2021-07-14 DIAGNOSIS — R002 Palpitations: Secondary | ICD-10-CM | POA: Diagnosis not present

## 2021-07-14 DIAGNOSIS — D0512 Intraductal carcinoma in situ of left breast: Secondary | ICD-10-CM | POA: Diagnosis not present

## 2021-07-14 DIAGNOSIS — I5189 Other ill-defined heart diseases: Secondary | ICD-10-CM | POA: Diagnosis not present

## 2021-07-14 DIAGNOSIS — C50112 Malignant neoplasm of central portion of left female breast: Secondary | ICD-10-CM | POA: Diagnosis not present

## 2021-07-14 DIAGNOSIS — I447 Left bundle-branch block, unspecified: Secondary | ICD-10-CM | POA: Diagnosis not present

## 2021-07-14 DIAGNOSIS — Z8616 Personal history of COVID-19: Secondary | ICD-10-CM | POA: Diagnosis not present

## 2021-07-15 DIAGNOSIS — D0512 Intraductal carcinoma in situ of left breast: Secondary | ICD-10-CM | POA: Diagnosis not present

## 2021-07-15 DIAGNOSIS — C50112 Malignant neoplasm of central portion of left female breast: Secondary | ICD-10-CM | POA: Diagnosis not present

## 2021-07-16 DIAGNOSIS — C50112 Malignant neoplasm of central portion of left female breast: Secondary | ICD-10-CM | POA: Diagnosis not present

## 2021-07-16 DIAGNOSIS — D0512 Intraductal carcinoma in situ of left breast: Secondary | ICD-10-CM | POA: Diagnosis not present

## 2021-07-17 DIAGNOSIS — D0512 Intraductal carcinoma in situ of left breast: Secondary | ICD-10-CM | POA: Diagnosis not present

## 2021-07-17 DIAGNOSIS — C50112 Malignant neoplasm of central portion of left female breast: Secondary | ICD-10-CM | POA: Diagnosis not present

## 2021-07-18 DIAGNOSIS — C50112 Malignant neoplasm of central portion of left female breast: Secondary | ICD-10-CM | POA: Diagnosis not present

## 2021-07-18 DIAGNOSIS — D0512 Intraductal carcinoma in situ of left breast: Secondary | ICD-10-CM | POA: Diagnosis not present

## 2021-07-21 DIAGNOSIS — C50112 Malignant neoplasm of central portion of left female breast: Secondary | ICD-10-CM | POA: Diagnosis not present

## 2021-07-21 DIAGNOSIS — L259 Unspecified contact dermatitis, unspecified cause: Secondary | ICD-10-CM | POA: Diagnosis not present

## 2021-07-21 DIAGNOSIS — D0512 Intraductal carcinoma in situ of left breast: Secondary | ICD-10-CM | POA: Diagnosis not present

## 2021-07-22 DIAGNOSIS — C50112 Malignant neoplasm of central portion of left female breast: Secondary | ICD-10-CM | POA: Diagnosis not present

## 2021-07-22 DIAGNOSIS — D0512 Intraductal carcinoma in situ of left breast: Secondary | ICD-10-CM | POA: Diagnosis not present

## 2021-07-23 DIAGNOSIS — C50112 Malignant neoplasm of central portion of left female breast: Secondary | ICD-10-CM | POA: Diagnosis not present

## 2021-07-23 DIAGNOSIS — D0512 Intraductal carcinoma in situ of left breast: Secondary | ICD-10-CM | POA: Diagnosis not present

## 2021-07-24 DIAGNOSIS — D0512 Intraductal carcinoma in situ of left breast: Secondary | ICD-10-CM | POA: Diagnosis not present

## 2021-07-25 DIAGNOSIS — L259 Unspecified contact dermatitis, unspecified cause: Secondary | ICD-10-CM | POA: Diagnosis not present

## 2021-07-25 DIAGNOSIS — D0512 Intraductal carcinoma in situ of left breast: Secondary | ICD-10-CM | POA: Diagnosis not present

## 2021-07-29 DIAGNOSIS — D0512 Intraductal carcinoma in situ of left breast: Secondary | ICD-10-CM | POA: Diagnosis not present

## 2021-07-30 DIAGNOSIS — D0512 Intraductal carcinoma in situ of left breast: Secondary | ICD-10-CM | POA: Diagnosis not present

## 2021-08-06 DIAGNOSIS — F439 Reaction to severe stress, unspecified: Secondary | ICD-10-CM | POA: Diagnosis not present

## 2021-08-11 DIAGNOSIS — I447 Left bundle-branch block, unspecified: Secondary | ICD-10-CM | POA: Diagnosis not present

## 2021-08-11 DIAGNOSIS — R002 Palpitations: Secondary | ICD-10-CM | POA: Diagnosis not present

## 2021-08-11 DIAGNOSIS — M8580 Other specified disorders of bone density and structure, unspecified site: Secondary | ICD-10-CM | POA: Diagnosis not present

## 2021-08-11 DIAGNOSIS — R490 Dysphonia: Secondary | ICD-10-CM | POA: Diagnosis not present

## 2021-09-05 DIAGNOSIS — J301 Allergic rhinitis due to pollen: Secondary | ICD-10-CM | POA: Diagnosis not present

## 2021-09-05 DIAGNOSIS — R490 Dysphonia: Secondary | ICD-10-CM | POA: Diagnosis not present

## 2021-09-05 DIAGNOSIS — K219 Gastro-esophageal reflux disease without esophagitis: Secondary | ICD-10-CM | POA: Diagnosis not present

## 2021-09-29 DIAGNOSIS — M8588 Other specified disorders of bone density and structure, other site: Secondary | ICD-10-CM | POA: Diagnosis not present

## 2021-10-22 DIAGNOSIS — F439 Reaction to severe stress, unspecified: Secondary | ICD-10-CM | POA: Diagnosis not present

## 2021-10-23 DIAGNOSIS — I82402 Acute embolism and thrombosis of unspecified deep veins of left lower extremity: Secondary | ICD-10-CM | POA: Diagnosis not present

## 2021-10-24 DIAGNOSIS — C50112 Malignant neoplasm of central portion of left female breast: Secondary | ICD-10-CM | POA: Diagnosis not present

## 2021-10-24 DIAGNOSIS — M79662 Pain in left lower leg: Secondary | ICD-10-CM | POA: Diagnosis not present

## 2021-10-24 DIAGNOSIS — D0512 Intraductal carcinoma in situ of left breast: Secondary | ICD-10-CM | POA: Diagnosis not present

## 2021-10-24 DIAGNOSIS — D0511 Intraductal carcinoma in situ of right breast: Secondary | ICD-10-CM | POA: Diagnosis not present

## 2021-10-24 DIAGNOSIS — Z170 Estrogen receptor positive status [ER+]: Secondary | ICD-10-CM | POA: Diagnosis not present

## 2021-10-28 DIAGNOSIS — D224 Melanocytic nevi of scalp and neck: Secondary | ICD-10-CM | POA: Diagnosis not present

## 2021-10-28 DIAGNOSIS — L570 Actinic keratosis: Secondary | ICD-10-CM | POA: Diagnosis not present

## 2021-10-28 DIAGNOSIS — D225 Melanocytic nevi of trunk: Secondary | ICD-10-CM | POA: Diagnosis not present

## 2021-10-28 DIAGNOSIS — D2230 Melanocytic nevi of unspecified part of face: Secondary | ICD-10-CM | POA: Diagnosis not present

## 2021-12-08 DIAGNOSIS — F439 Reaction to severe stress, unspecified: Secondary | ICD-10-CM | POA: Diagnosis not present

## 2021-12-22 DIAGNOSIS — Z01818 Encounter for other preprocedural examination: Secondary | ICD-10-CM | POA: Diagnosis not present

## 2021-12-22 DIAGNOSIS — Z1211 Encounter for screening for malignant neoplasm of colon: Secondary | ICD-10-CM | POA: Diagnosis not present

## 2022-01-08 DIAGNOSIS — Z13220 Encounter for screening for lipoid disorders: Secondary | ICD-10-CM | POA: Diagnosis not present

## 2022-01-08 DIAGNOSIS — Z1389 Encounter for screening for other disorder: Secondary | ICD-10-CM | POA: Diagnosis not present

## 2022-01-08 DIAGNOSIS — Z0000 Encounter for general adult medical examination without abnormal findings: Secondary | ICD-10-CM | POA: Diagnosis not present

## 2022-01-08 DIAGNOSIS — Z131 Encounter for screening for diabetes mellitus: Secondary | ICD-10-CM | POA: Diagnosis not present

## 2022-01-14 DIAGNOSIS — I447 Left bundle-branch block, unspecified: Secondary | ICD-10-CM | POA: Diagnosis not present

## 2022-01-14 DIAGNOSIS — I5189 Other ill-defined heart diseases: Secondary | ICD-10-CM | POA: Diagnosis not present

## 2022-01-14 DIAGNOSIS — Z23 Encounter for immunization: Secondary | ICD-10-CM | POA: Diagnosis not present

## 2022-01-14 DIAGNOSIS — R002 Palpitations: Secondary | ICD-10-CM | POA: Diagnosis not present

## 2022-01-15 DIAGNOSIS — Z0000 Encounter for general adult medical examination without abnormal findings: Secondary | ICD-10-CM | POA: Diagnosis not present

## 2022-01-15 DIAGNOSIS — I429 Cardiomyopathy, unspecified: Secondary | ICD-10-CM | POA: Diagnosis not present

## 2022-01-15 DIAGNOSIS — D0511 Intraductal carcinoma in situ of right breast: Secondary | ICD-10-CM | POA: Diagnosis not present

## 2022-01-26 DIAGNOSIS — K640 First degree hemorrhoids: Secondary | ICD-10-CM | POA: Diagnosis not present

## 2022-01-26 DIAGNOSIS — K635 Polyp of colon: Secondary | ICD-10-CM | POA: Diagnosis not present

## 2022-01-26 DIAGNOSIS — Z1211 Encounter for screening for malignant neoplasm of colon: Secondary | ICD-10-CM | POA: Diagnosis not present

## 2022-01-26 DIAGNOSIS — D123 Benign neoplasm of transverse colon: Secondary | ICD-10-CM | POA: Diagnosis not present

## 2022-01-26 DIAGNOSIS — K6389 Other specified diseases of intestine: Secondary | ICD-10-CM | POA: Diagnosis not present

## 2022-01-28 DIAGNOSIS — C50112 Malignant neoplasm of central portion of left female breast: Secondary | ICD-10-CM | POA: Diagnosis not present

## 2022-01-28 DIAGNOSIS — Z86000 Personal history of in-situ neoplasm of breast: Secondary | ICD-10-CM | POA: Diagnosis not present

## 2022-01-28 DIAGNOSIS — Z1371 Encounter for nonprocreative screening for genetic disease carrier status: Secondary | ICD-10-CM | POA: Diagnosis not present

## 2022-01-28 DIAGNOSIS — D0512 Intraductal carcinoma in situ of left breast: Secondary | ICD-10-CM | POA: Diagnosis not present

## 2022-01-28 DIAGNOSIS — N6489 Other specified disorders of breast: Secondary | ICD-10-CM | POA: Diagnosis not present

## 2022-01-28 DIAGNOSIS — Z170 Estrogen receptor positive status [ER+]: Secondary | ICD-10-CM | POA: Diagnosis not present

## 2022-02-12 DIAGNOSIS — I429 Cardiomyopathy, unspecified: Secondary | ICD-10-CM | POA: Diagnosis not present

## 2022-03-25 DIAGNOSIS — Z6821 Body mass index (BMI) 21.0-21.9, adult: Secondary | ICD-10-CM | POA: Diagnosis not present

## 2022-03-25 DIAGNOSIS — Z124 Encounter for screening for malignant neoplasm of cervix: Secondary | ICD-10-CM | POA: Diagnosis not present

## 2022-03-25 DIAGNOSIS — Z01419 Encounter for gynecological examination (general) (routine) without abnormal findings: Secondary | ICD-10-CM | POA: Diagnosis not present

## 2022-03-25 DIAGNOSIS — Z1151 Encounter for screening for human papillomavirus (HPV): Secondary | ICD-10-CM | POA: Diagnosis not present

## 2022-05-30 IMAGING — MG MM  DIGITAL DIAGNOSTIC BREAST BILAT IMPLANT W/ TOMO W/ CAD
8 of 11 series · 8 of 27 positions shown · non-contrast
Comparison: Previous exam(s).

CLINICAL DATA: 49-year-old female for further evaluation of
possible masses within both breasts and LEFT breast calcifications
on screening mammogram. History of RIGHT breast cancer and
lumpectomy in 5165.

EXAM:
DIGITAL DIAGNOSTIC BILATERAL MAMMOGRAM WITH IMPLANTS, CAD AND
TOMOSYNTHESIS; ULTRASOUND RIGHT BREAST LIMITED
TECHNIQUE: Bilateral digital diagnostic mammography and breast tomosynthesis
was performed. The images were evaluated with computer-aided
detection. Standard and/or implant displaced views were performed.;
Targeted ultrasound examination of the right breast was performed

[L ML (1 of 2)]
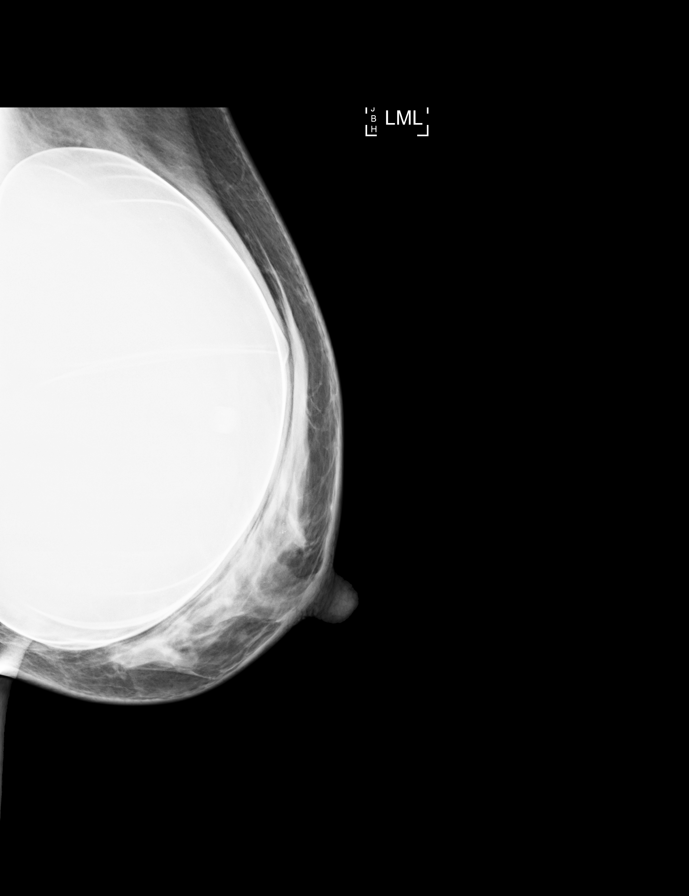

[L CC]
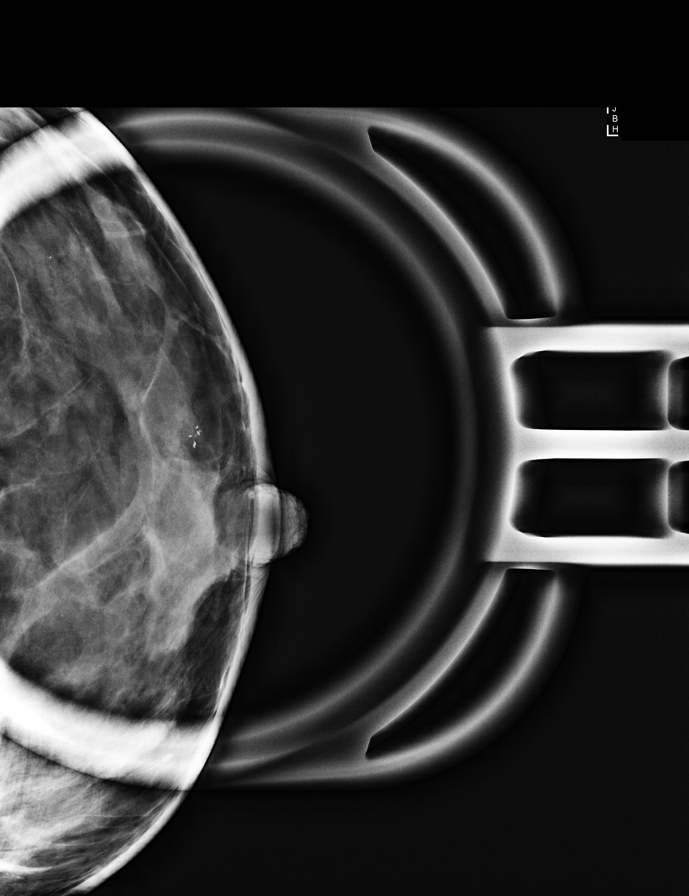

[L ML (2 of 2)]
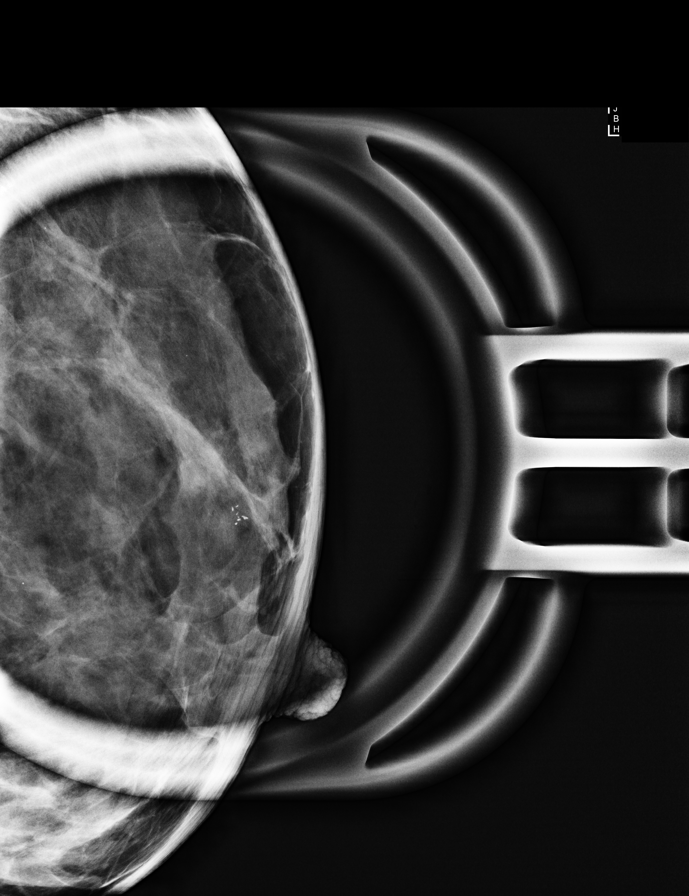

[R MLO synth-2D]
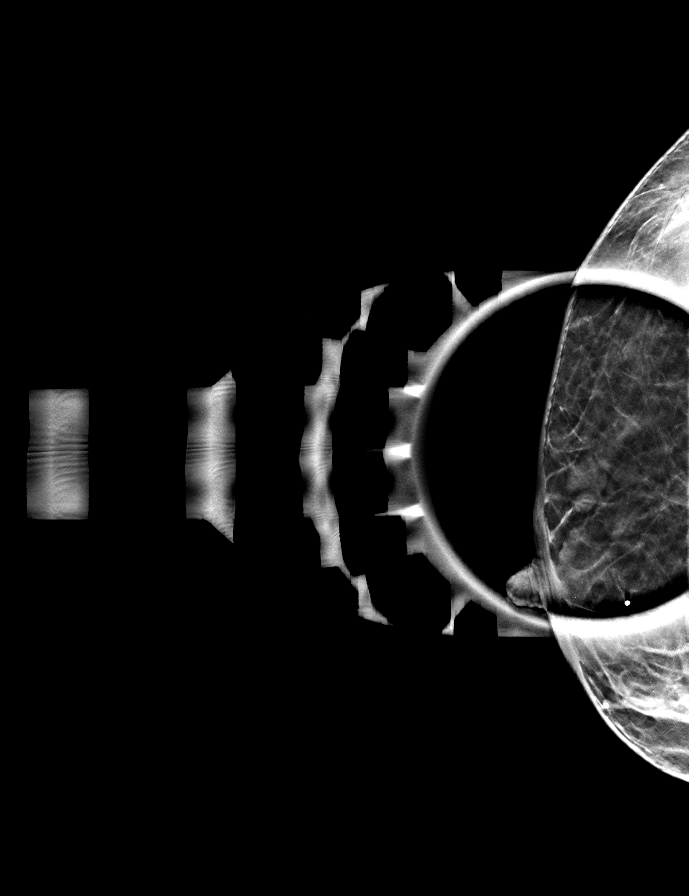

[L MLO synth-2D]
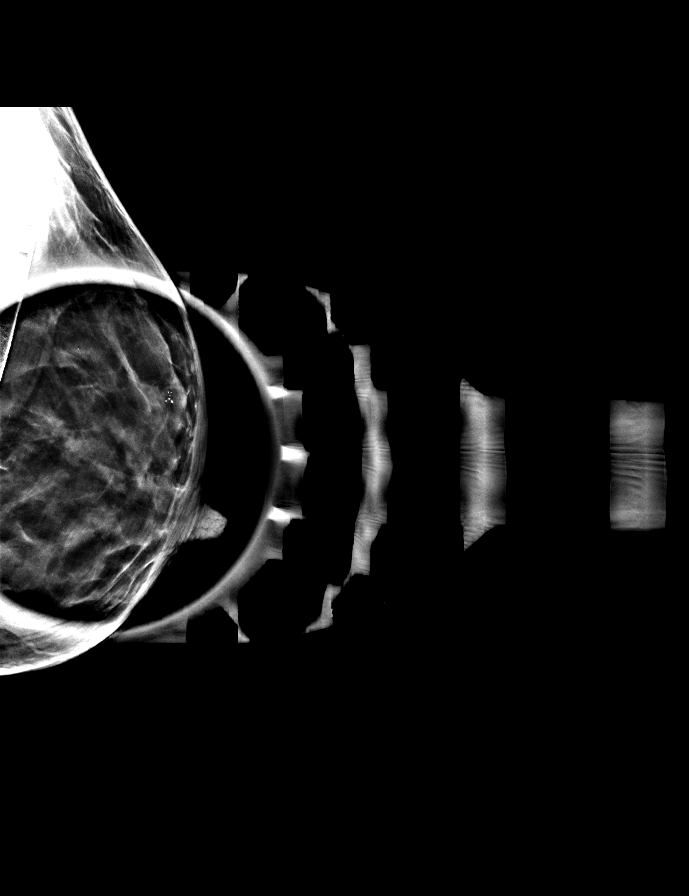

[R CC synth-2D]
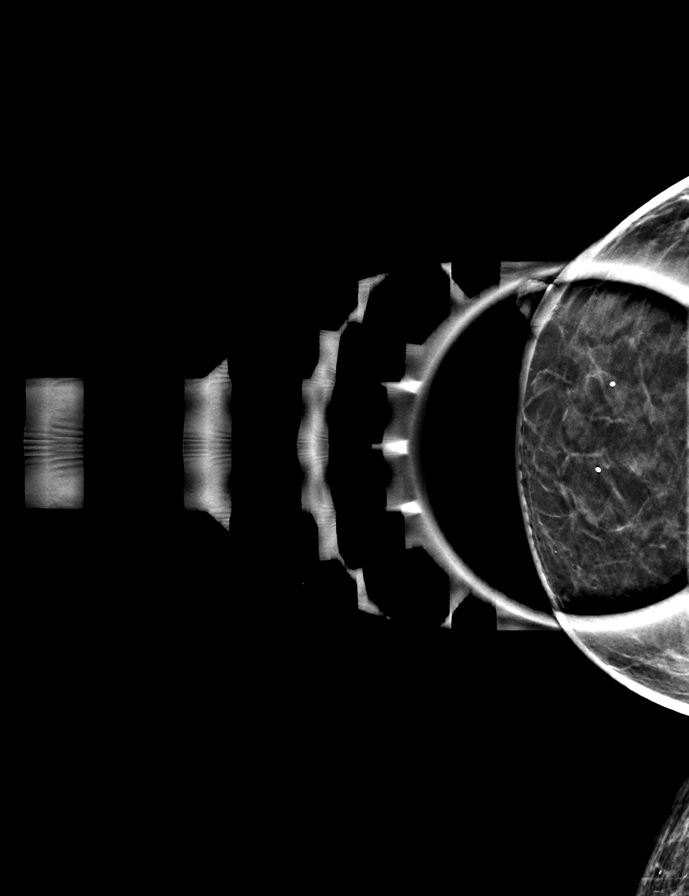

[L ML synth-2D]
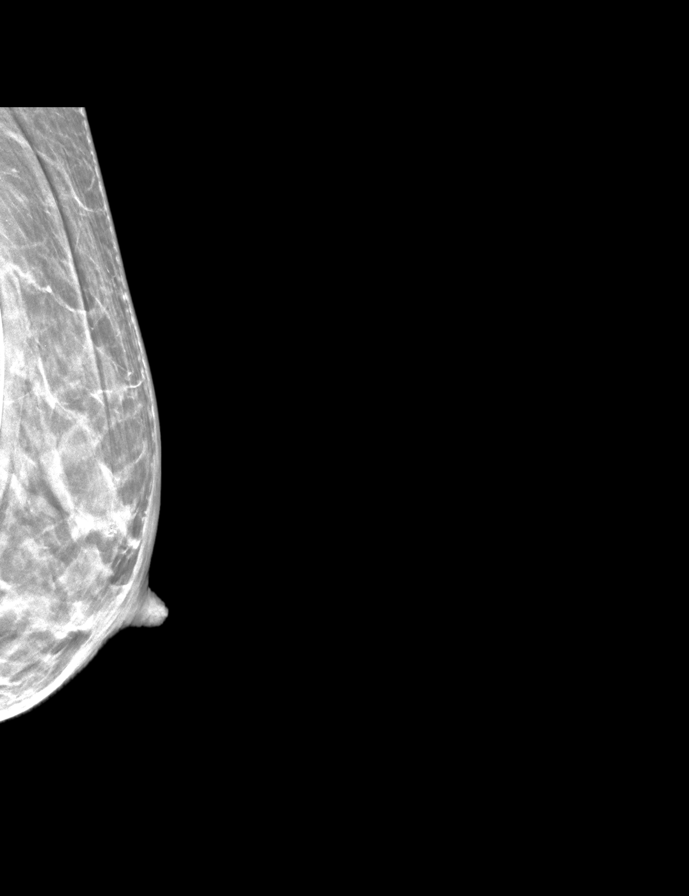

[L MLID BREAST TOMOSYNTHESIS IMAGE tomo · tomo slice 20/39.0]
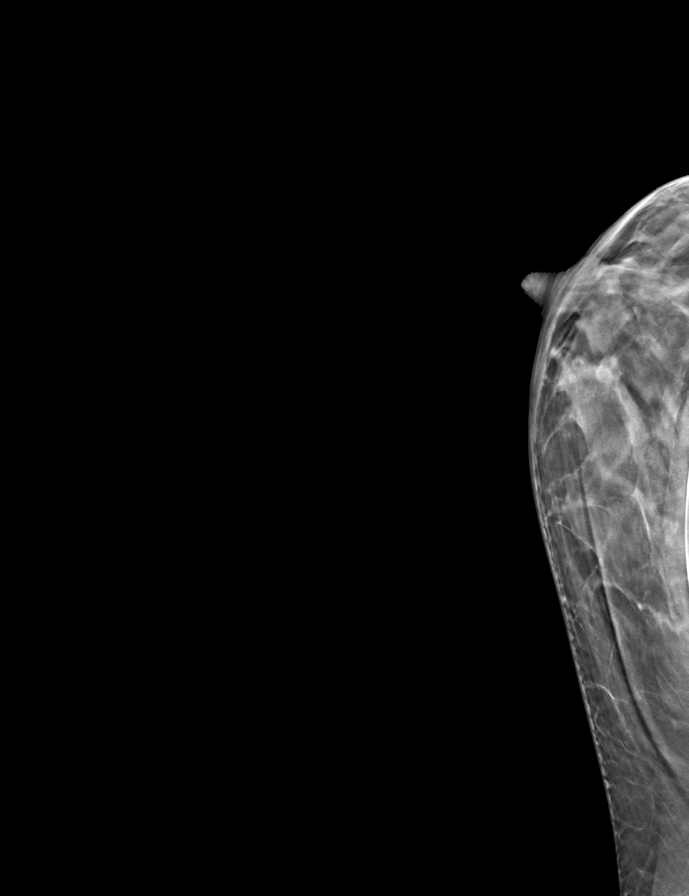

[8 of 27 positions shown; findings below may reference images not displayed]

ACR Breast Density Category c: The breast tissue is heterogeneously
dense, which may obscure small masses.
FINDINGS: Spot compression views both breasts and full field and magnification
views of the LEFT breast are performed.

Two adjacent circumscribed oval masses are identified within the
anterior UPPER RIGHT breast

A 0.3 cm group of heterogeneous calcifications within the anterior
UPPER-OUTER LEFT breast are noted.

No persistent abnormality is identified in the area of the possible
posterior LEFT breast mass.

Targeted ultrasound is performed, showing 2 oval hypoechoic masses
at the 12 o'clock position of the RIGHT breast 1 cm from the nipple,
the 1st measuring 0.5 x 0.2 x 0.6 cm and the 2nd measuring 0.6 x
x 0.7 cm.

No abnormal RIGHT axillary lymph nodes are noted.
IMPRESSION: 1. Two indeterminate masses within the UPPER RIGHT breast measuring
0.6 and 0.7 cm respectively. Tissue sampling is recommended given
that these masses have a similar appearance to patient's breast
cancer diagnosed in 5900.
2. No abnormal appearing RIGHT axillary lymph nodes.
3. 0.3 cm group of indeterminate calcifications within the anterior
UPPER-OUTER LEFT breast. Tissue sampling is recommended.
4. No persistent mammographic abnormality identified in the area of
the possible posterior LEFT breast mass

RECOMMENDATION:
1. Ultrasound-guided biopsies of 2 adjacent masses within the UPPER
RIGHT breast.
2. Stereotactic guided biopsy of UPPER-OUTER LEFT breast
calcifications.

If any of the above biopsies are positive for malignancy, then
strongly consider breast MRI given dense breast tissue.

I have discussed the findings and recommendations with the patient.
If applicable, a reminder letter will be sent to the patient
regarding the next appointment.

BI-RADS CATEGORY  4: Suspicious.

## 2022-06-03 DIAGNOSIS — D224 Melanocytic nevi of scalp and neck: Secondary | ICD-10-CM | POA: Diagnosis not present

## 2022-06-03 DIAGNOSIS — D2230 Melanocytic nevi of unspecified part of face: Secondary | ICD-10-CM | POA: Diagnosis not present

## 2022-06-03 DIAGNOSIS — D225 Melanocytic nevi of trunk: Secondary | ICD-10-CM | POA: Diagnosis not present

## 2022-06-03 DIAGNOSIS — B079 Viral wart, unspecified: Secondary | ICD-10-CM | POA: Diagnosis not present

## 2022-06-03 DIAGNOSIS — D2260 Melanocytic nevi of unspecified upper limb, including shoulder: Secondary | ICD-10-CM | POA: Diagnosis not present

## 2022-06-09 IMAGING — MG MM BREAST LOCALIZATION CLIP
4 series · 4 of 12 positions shown · non-contrast
Comparison: Previous exam(s).

CLINICAL DATA: Status post ultrasound-guided core biopsies of the
right breast (x2) and stereotactic biopsy of the left breast.

EXAM:
3D DIAGNOSTIC BILATERAL MAMMOGRAM POST ULTRASOUND BIOPSIES OF THE
RIGHT BREAST AND STEREOTACTIC BIOPSY OF THE LEFT BREAST

[R ML synth-2D]
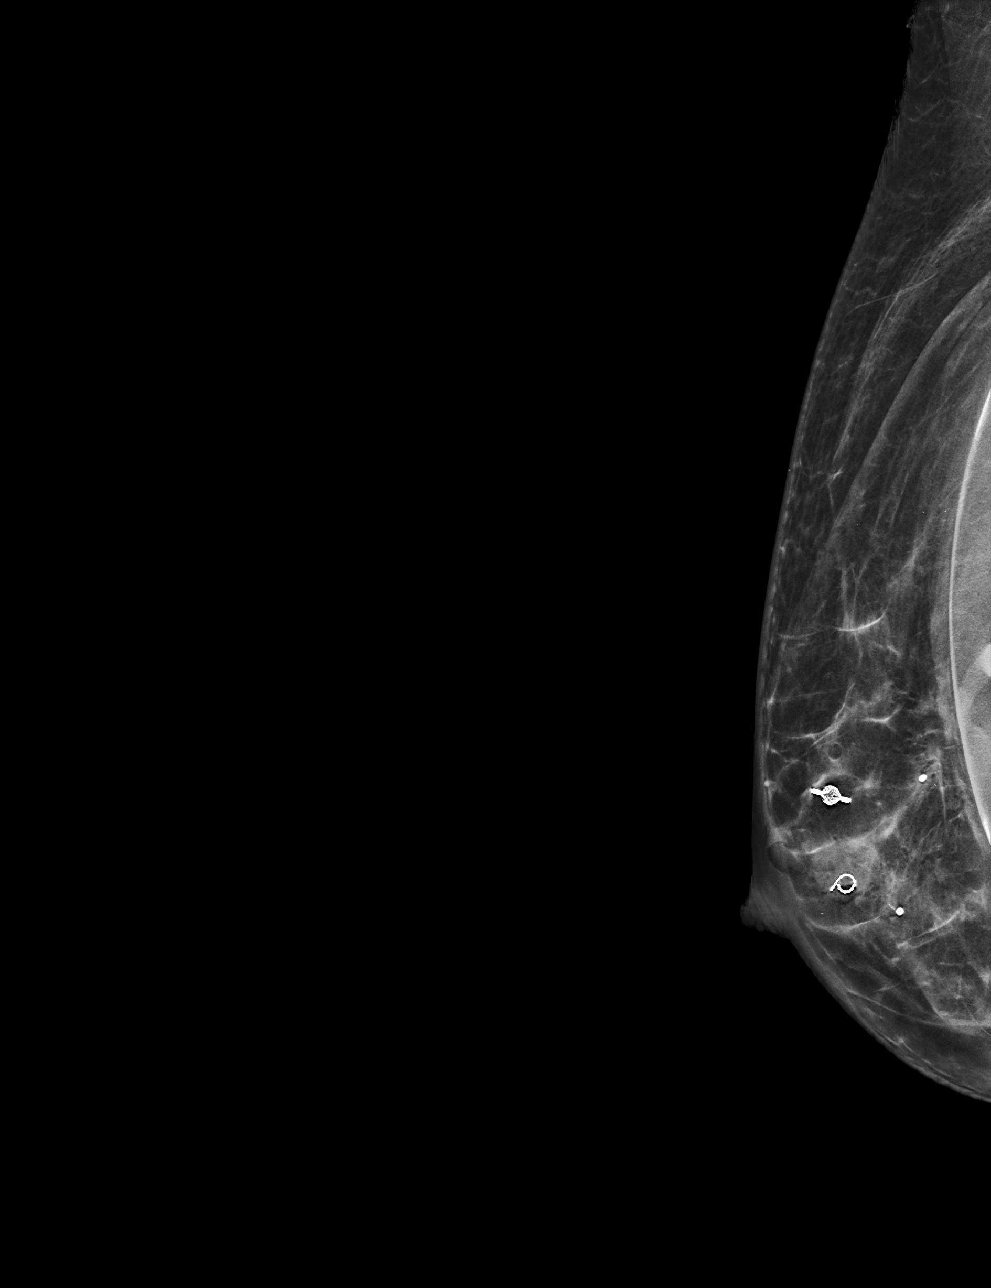

[R CC synth-2D]
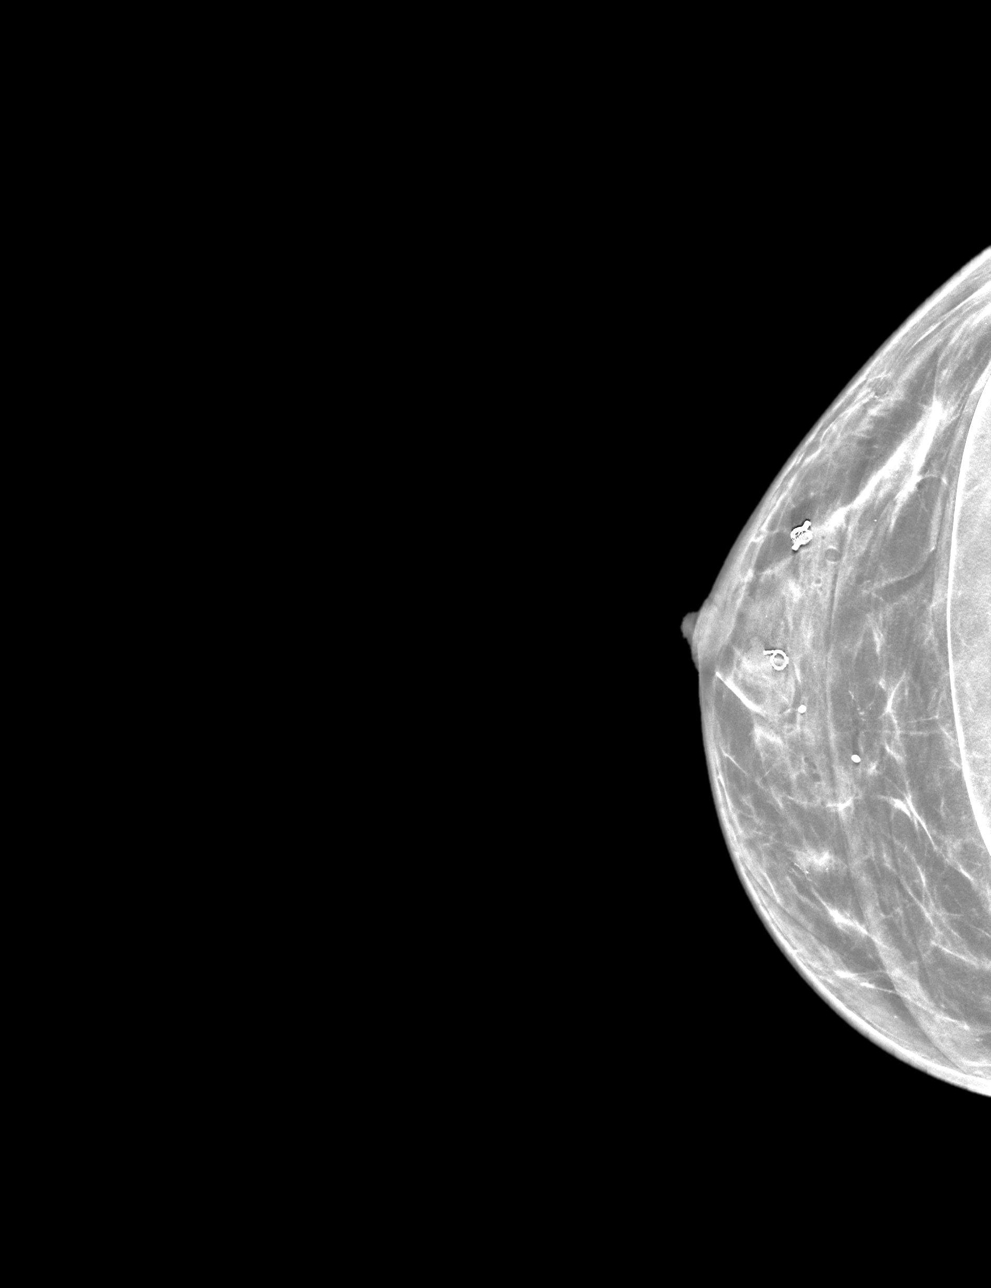

[R CC tomo · tomo slice 26/51.0]
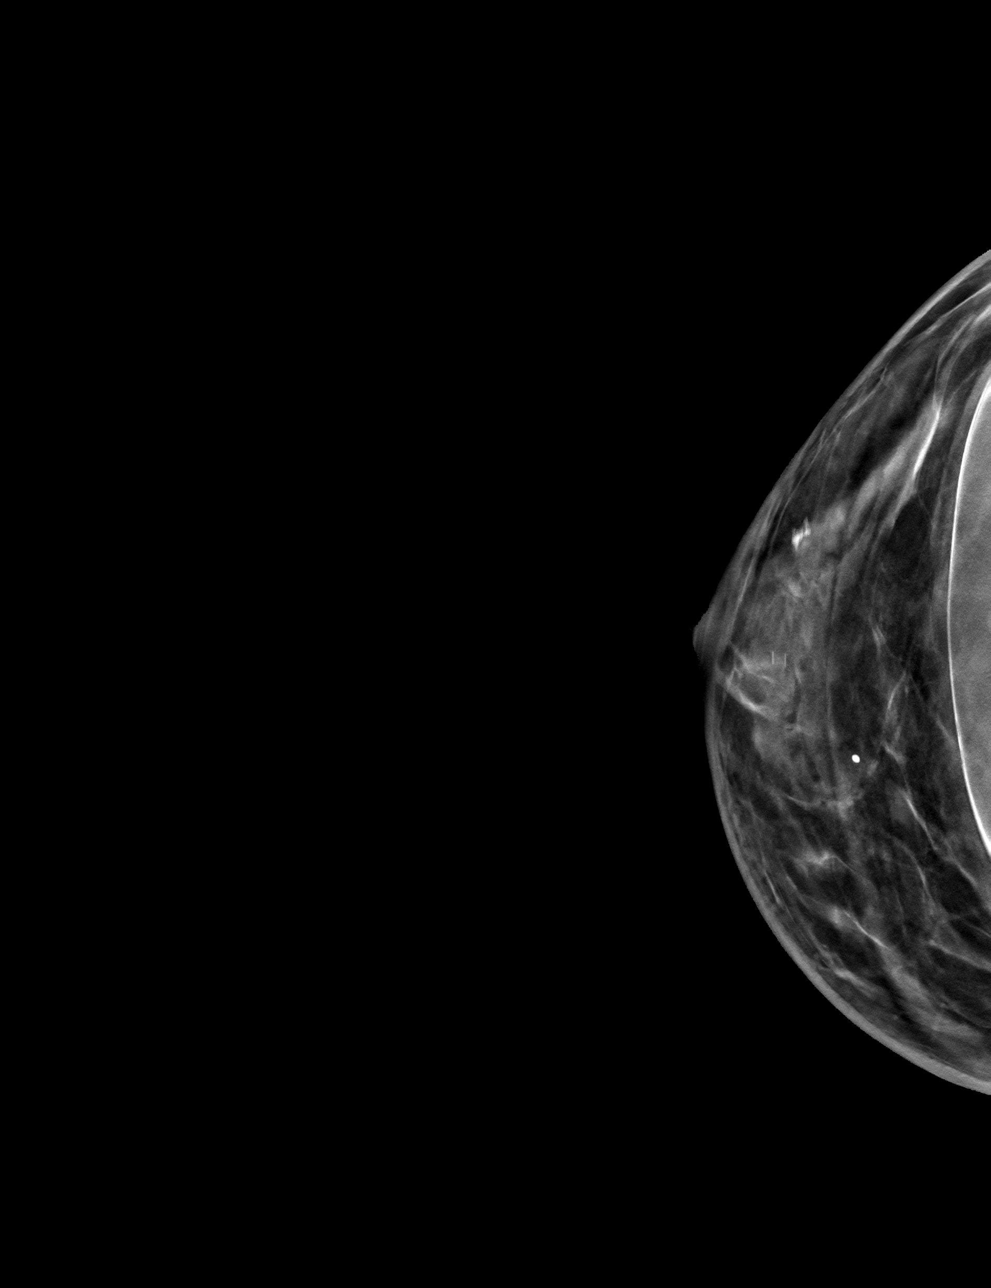

[R ML tomo · tomo slice 25/48.0]
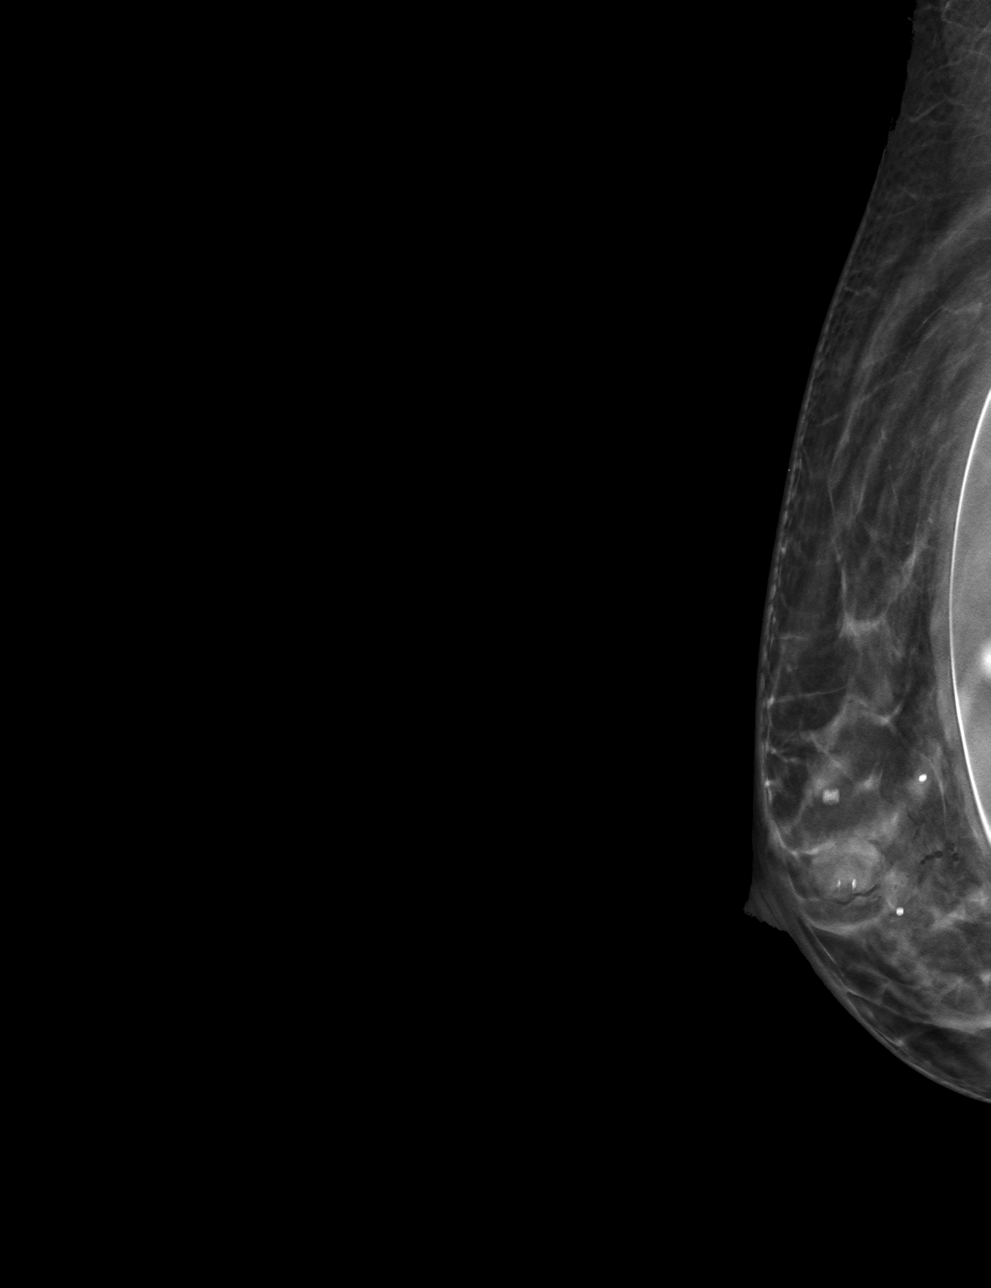

[4 of 12 positions shown; findings below may reference images not displayed]

FINDINGS: 3D Mammographic images were obtained following ultrasound-guided
core biopsies of the right breast and stereotactic biopsy of the
left breast.

In the right breast there is a Vision shaped clip in the 11 o'clock
region of the right breast and a Q shaped clip in the 12 o'clock
region of the right breast.

The ribbon shaped clip in the upper-outer quadrant of the left
breast is located 9 mm anterior to the biopsied calcifications.
There are residual calcifications that can be use for localization
purposes.
IMPRESSION: Appropriate positioning of the vision shaped clip in the 11 o'clock
region of the right breast and the Q shaped clip in the 12 o'clock
region of the right breast. The ribbon shaped clip in the
upper-outer quadrant of the left breast is located approximately 9
mm anterior to the biopsied calcifications. Residual calcifications
can be use for localization purposes.

Final Assessment: Post Procedure Mammograms for Marker Placement

## 2022-07-31 DIAGNOSIS — D0512 Intraductal carcinoma in situ of left breast: Secondary | ICD-10-CM | POA: Diagnosis not present

## 2022-08-03 DIAGNOSIS — N760 Acute vaginitis: Secondary | ICD-10-CM | POA: Diagnosis not present

## 2022-08-03 DIAGNOSIS — N939 Abnormal uterine and vaginal bleeding, unspecified: Secondary | ICD-10-CM | POA: Diagnosis not present

## 2022-08-03 DIAGNOSIS — N951 Menopausal and female climacteric states: Secondary | ICD-10-CM | POA: Diagnosis not present

## 2022-08-10 DIAGNOSIS — N939 Abnormal uterine and vaginal bleeding, unspecified: Secondary | ICD-10-CM | POA: Diagnosis not present

## 2022-08-26 DIAGNOSIS — N840 Polyp of corpus uteri: Secondary | ICD-10-CM | POA: Diagnosis not present

## 2022-09-01 ENCOUNTER — Emergency Department
Admission: EM | Admit: 2022-09-01 | Discharge: 2022-09-02 | Disposition: A | Discharge status: Home / Self Care | Payer: BC Managed Care – PPO | Attending: Emergency Medicine | Admitting: Emergency Medicine

## 2022-09-01 ENCOUNTER — Other Ambulatory Visit: Payer: Self-pay

## 2022-09-01 DIAGNOSIS — Z853 Personal history of malignant neoplasm of breast: Secondary | ICD-10-CM | POA: Diagnosis not present

## 2022-09-01 DIAGNOSIS — R42 Dizziness and giddiness: Secondary | ICD-10-CM | POA: Diagnosis not present

## 2022-09-01 DIAGNOSIS — R7989 Other specified abnormal findings of blood chemistry: Secondary | ICD-10-CM | POA: Insufficient documentation

## 2022-09-01 DIAGNOSIS — R457 State of emotional shock and stress, unspecified: Secondary | ICD-10-CM | POA: Diagnosis not present

## 2022-09-01 DIAGNOSIS — N939 Abnormal uterine and vaginal bleeding, unspecified: Secondary | ICD-10-CM | POA: Insufficient documentation

## 2022-09-01 DIAGNOSIS — Z7982 Long term (current) use of aspirin: Secondary | ICD-10-CM | POA: Diagnosis not present

## 2022-09-01 DIAGNOSIS — R58 Hemorrhage, not elsewhere classified: Secondary | ICD-10-CM | POA: Diagnosis not present

## 2022-09-01 DIAGNOSIS — N888 Other specified noninflammatory disorders of cervix uteri: Secondary | ICD-10-CM | POA: Diagnosis not present

## 2022-09-01 DIAGNOSIS — R9431 Abnormal electrocardiogram [ECG] [EKG]: Secondary | ICD-10-CM | POA: Diagnosis not present

## 2022-09-01 LAB — CBC WITH DIFFERENTIAL/PLATELET
Abs Immature Granulocytes: 0.01 10*3/uL (ref 0.00–0.07)
Basophils Absolute: 0 10*3/uL (ref 0.0–0.1)
Basophils Relative: 0 %
Eosinophils Absolute: 0.1 10*3/uL (ref 0.0–0.5)
Eosinophils Relative: 2 %
HCT: 41.5 % (ref 36.0–46.0)
Hemoglobin: 13.2 g/dL (ref 12.0–15.0)
Immature Granulocytes: 0 %
Lymphocytes Relative: 19 %
Lymphs Abs: 1.1 10*3/uL (ref 0.7–4.0)
MCH: 30.3 pg (ref 26.0–34.0)
MCHC: 31.8 g/dL (ref 30.0–36.0)
MCV: 95.4 fL (ref 80.0–100.0)
Monocytes Absolute: 0.6 10*3/uL (ref 0.1–1.0)
Monocytes Relative: 10 %
Neutro Abs: 4 10*3/uL (ref 1.7–7.7)
Neutrophils Relative %: 69 %
Platelets: 219 10*3/uL (ref 150–400)
RBC: 4.35 MIL/uL (ref 3.87–5.11)
RDW: 13.5 % (ref 11.5–15.5)
WBC: 5.8 10*3/uL (ref 4.0–10.5)
nRBC: 0 % (ref 0.0–0.2)

## 2022-09-01 LAB — COMPREHENSIVE METABOLIC PANEL
ALT: 23 U/L (ref 0–44)
AST: 28 U/L (ref 15–41)
Albumin: 3.9 g/dL (ref 3.5–5.0)
Alkaline Phosphatase: 41 U/L (ref 38–126)
Anion gap: 11 (ref 5–15)
BUN: 23 mg/dL — ABNORMAL HIGH (ref 6–20)
CO2: 26 mmol/L (ref 22–32)
Calcium: 9.3 mg/dL (ref 8.9–10.3)
Chloride: 102 mmol/L (ref 98–111)
Creatinine, Ser: 1.06 mg/dL — ABNORMAL HIGH (ref 0.44–1.00)
GFR, Estimated: >60 mL/min (ref >60)
Glucose, Bld: 107 mg/dL — ABNORMAL HIGH (ref 70–99)
Potassium: 3.5 mmol/L (ref 3.5–5.1)
Sodium: 139 mmol/L (ref 135–145)
Total Bilirubin: 0.6 mg/dL (ref 0.3–1.2)
Total Protein: 6.9 g/dL (ref 6.5–8.1)

## 2022-09-01 LAB — TYPE AND SCREEN
ABO/RH(D): O POS
Antibody Screen: NEGATIVE

## 2022-09-01 NOTE — ED Triage Notes (Signed)
Pt had endometrial polyps removed last Wednesday and is having heavy bleeding with clots tonight.

## 2022-09-02 ENCOUNTER — Emergency Department: Payer: BC Managed Care – PPO

## 2022-09-02 DIAGNOSIS — N888 Other specified noninflammatory disorders of cervix uteri: Secondary | ICD-10-CM | POA: Diagnosis not present

## 2022-09-02 DIAGNOSIS — N939 Abnormal uterine and vaginal bleeding, unspecified: Secondary | ICD-10-CM | POA: Diagnosis not present

## 2022-09-02 LAB — PROTIME-INR
INR: 1 (ref 0.8–1.2)
Prothrombin Time: 13.1 seconds (ref 11.4–15.2)

## 2022-09-02 LAB — HEMOGLOBIN AND HEMATOCRIT, BLOOD
HCT: 38.7 % (ref 36.0–46.0)
Hemoglobin: 12.6 g/dL (ref 12.0–15.0)

## 2022-09-02 LAB — HCG, QUANTITATIVE, PREGNANCY: hCG, Beta Chain, Quant, S: 1 m[IU]/mL (ref <5)

## 2022-09-02 MED ORDER — KETOROLAC TROMETHAMINE 30 MG/ML IJ SOLN
30.0000 mg | Freq: Once | INTRAMUSCULAR | Status: AC
  Administered 2022-09-02: 30 mg via INTRAVENOUS
  Filled 2022-09-02: qty 1

## 2022-09-02 MED ORDER — SODIUM CHLORIDE 0.9 % IV BOLUS (SEPSIS)
1000.0000 mL | Freq: Once | INTRAVENOUS | Status: AC
  Administered 2022-09-02: 1000 mL via INTRAVENOUS

## 2022-09-02 NOTE — Discharge Instructions (Addendum)
We have reviewed your case with Dr. Bridgett Larsson on-call for Dr. Matthew Saras.  She reports your symptoms are to be expected 1 week after a Myosure and is reassured by your vital signs, blood counts today and has reviewed her ultrasound.  She recommends that you call the office first thing in the morning for an appointment with Dr. Glyn Ade.  She does not feel you need to be started on any medications from the ED at this time.

## 2022-09-02 NOTE — ED Notes (Signed)
Pt in u/s

## 2022-09-02 NOTE — ED Provider Notes (Signed)
[facility] Provider Note    Event Date/Time   First MD Initiated Contact with Patient 09/02/22 0011     (approximate)   History   Vaginal Bleeding and Post-op Problem   HPI  Kaylee Benson is a 51 y.o. female history of breast cancer who recently underwent removal of endometrial polyps, hysteroscopy with MyoSure with Dr[facility] [place] on 08/26/2022 who presents to the emergency department increased vaginal bleeding today.  States that she has intermittently had heavy bleeding but mostly just spotting but then today started having "gushes" of blood and soaked through more than 2 pads an hour for more than 2 hours and passing small clots.  States she is having abdominal pain and back pain but no fevers, abnormal discharge, odor, vomiting.  She states she is feeling dizzy.  Not on blood thinners.  She attempted to call the on-call provider and got a recording so she did not leave a message or discussed the case with a nurse.  She came to the closest emergency department.   History provided by patient and daughter.    Past Medical History:  Diagnosis Date   Allergy    seasonal   Breast cancer ([place]) right   radiation   Carcinoma in situ of breast 04/15/2015   Kidney stone    Kidney stones    Personal history of radiation therapy 2016   RIGHT lumpectomy   PONV (postoperative nausea and vomiting)    nausea   Sclerosis of sacroiliac joint     Past Surgical History:  Procedure Laterality Date   AUGMENTATION MAMMAPLASTY     BREAST BIOPSY Right 2016   LOW GRADE DUCTAL CARCINOMA IN SITU, 0.45 CM IN GREATEST DIMENSION   BREAST BIOPSY Left 02/10/2021   Affirm bx of calcs  "Ribbon" clip-path pending   BREAST CAPSULECTOMY WITH IMPLANT EXCHANGE Right 01/30/2015   Procedure: BREAST CAPSULECTOMY WITH IMPLANT EXCHANGE;  Surgeon: Bea Graff, MD;  Location: [place];  Service: Plastics;  Laterality: Right;   BREAST  ENHANCEMENT SURGERY Bilateral    BREAST LUMPECTOMY Right 2016   LOW GRADE DUCTAL CARCINOMA IN SITU, 0.45 CM IN GREATEST DIMENSION   BREAST LUMPECTOMY WITH NEEDLE LOCALIZATION Right 01/30/2015   Procedure: BREAST LUMPECTOMY WITH NEEDLE LOCALIZATION;  Surgeon: Erroll Luna, MD;  Location: [place];  Service: General;  Laterality: Right;   BREAST SURGERY     bilateral breast implants 2001   FOOT SURGERY Left    LAPAROSCOPIC APPENDECTOMY N/A 11/02/2016   Procedure: APPENDECTOMY LAPAROSCOPIC;  Surgeon: Dia Crawford III, MD;  Location: [facility];  Service: General;  Laterality: N/A;   MOUTH SURGERY     ORIF ANKLE FRACTURE     left   Surgery on left lower leg     2007    MEDICATIONS:  Prior to Admission medications   Medication Sig Start Date End Date Taking? Authorizing Provider  aspirin 81 MG chewable tablet Chew 81 mg by mouth daily. 01/09/21   [provider]  BIOTIN PO Take 1 tablet by mouth daily. Reported on 04/22/2016 10,075mg    [provider]  Calcium Carbonate-Vitamin D (CALCIUM + D PO) Take by mouth once. Patient not taking: Reported on 03/05/2021    [provider]  Cholecalciferol (VITAMIN D3) 25 MCG (1000 UT) CAPS Take 1 capsule by mouth daily at 2 am.    [provider]  fluticasone (FLONASE) 50 MCG/ACT nasal spray Place 2  sprays into both nostrils daily. 10/08/16   Jerrol Banana., MD  ibuprofen (ADVIL) 600 MG tablet Take 1 tablet (600 mg total) by mouth every 8 (eight) hours as needed. Patient not taking: Reported on 03/05/2021 05/08/20   Jerrol Banana., MD  magnesium oxide (MAG-OX) 400 MG tablet Take 400 mg by mouth 2 (two) times daily. 02/05/21   [provider]  metoprolol succinate (TOPROL-XL) 25 MG 24 hr tablet Take 1 tablet by mouth daily. 01/09/21 01/09/22  [provider]  Misc Natural Products (OSTEO BI-FLEX ADV JOINT SHIELD) TABS Take 1 tablet by mouth daily. Patient not taking: Reported on  03/05/2021    [provider]  Multiple Vitamin (MULTIVITAMIN) tablet Take 1 tablet by mouth daily.    [provider]  Omega-3 Fatty Acids (FISH OIL BURP-LESS) 1000 MG CAPS Take 2 capsules by mouth daily. Patient not taking: Reported on 03/05/2021    [provider]  omeprazole (PRILOSEC) 20 MG capsule Take 20 mg by mouth 2 (two) times daily before a meal.    [provider]  Turmeric 500 MG TABS Take 1 capsule by mouth daily.  Patient not taking: No sig reported    [provider]  Zinc 50 MG TABS Take 1 tablet by mouth daily at 12 noon.    [provider]    Physical Exam   Triage Vital Signs: ED Triage Vitals [09/01/22 2238]  Enc Vitals Group     BP 122/83     Pulse Rate 91     Resp 18     Temp 98.6 F (37 C)     Temp Source Oral     SpO2 96 %     Weight 115 lb (52.2 kg)     Height 5' (1.524 m)     Head Circumference      Peak Flow      Pain Score      Pain Loc      Pain Edu?      Excl. in [place]?     Most recent vital signs: Vitals:   09/02/22 0141 09/02/22 0200  BP: 99/78 109/76  Pulse: 66 65  Resp: 20 12  Temp: 98.5 F (36.9 C)   SpO2: 97% 99%    CONSTITUTIONAL: Alert and oriented and responds appropriately to questions. Well-appearing; well-nourished HEAD: Normocephalic, atraumatic EYES: Conjunctivae clear, pupils appear equal, sclera nonicteric ENT: normal nose; moist mucous membranes NECK: Supple, normal ROM CARD: RRR; S1 and S2 appreciated; no murmurs, no clicks, no rubs, no gallops RESP: Normal chest excursion without splinting or tachypnea; breath sounds clear and equal bilaterally; no wheezes, no rhonchi, no rales, no hypoxia or respiratory distress, speaking full sentences ABD/GI: Normal bowel sounds; non-distended; soft, non-tender, no rebound, no guarding, no peritoneal signs GU:  Normal external genitalia. No lesions, rashes noted. Patient has moderate amount of dark red vaginal bleeding on exam  coming from the cervical os with small clots present.  No other abnormal discharge noted, odor.  Bimanual exam deferred.  Cervix closed.  Chaperone present for exam. BACK: The back appears normal EXT: Normal ROM in all joints; no deformity noted, no edema; no cyanosis SKIN: Normal color for age and race; warm; no rash on exposed skin NEURO: Moves all extremities equally, normal speech PSYCH: The patient's mood and manner are appropriate.   ED Results / Procedures / Treatments   LABS: (all labs ordered are listed, but only abnormal results are displayed) Labs Reviewed  COMPREHENSIVE METABOLIC PANEL - Abnormal; Notable for the following components:      Result Value   Glucose, Bld 107 (*)    BUN 23 (*)    Creatinine, Ser 1.06 (*)    All other components within normal limits  CBC WITH DIFFERENTIAL/PLATELET  HCG, QUANTITATIVE, PREGNANCY  HEMOGLOBIN AND HEMATOCRIT, BLOOD  PROTIME-INR  TYPE AND SCREEN     EKG:   RADIOLOGY: My personal review and interpretation of imaging: Ultrasound shows thickened endometrial stripe.  I have personally reviewed all radiology reports.   US PELVIC COMPLETE W TRANSVAGINAL AND TORSION R/O  Result Date: 09/02/2022 CLINICAL DATA:  Heavy bleeding with clots tonight. History of endometrial polyp removal last Wednesday. EXAM: TRANSABDOMINAL AND TRANSVAGINAL ULTRASOUND OF PELVIS DOPPLER ULTRASOUND OF OVARIES TECHNIQUE: Both transabdominal and transvaginal ultrasound examinations of the pelvis were performed. Transabdominal technique was performed for global imaging of the pelvis including uterus, ovaries, adnexal regions, and pelvic cul-de-sac. It was necessary to proceed with endovaginal exam following the transabdominal exam to visualize the ovaries and endometrium. Color and duplex Doppler ultrasound was utilized to evaluate blood flow to the ovaries. COMPARISON:  CT 11/02/2016 FINDINGS: Uterus Measurements: 9.8 x 5.2 x 7.1 cm = volume: 187 mL. Uterus is  anteverted. No fibroids or other mass visualized. Small nabothian cysts in the cervix. Endometrium Thickness: 2.5 cm. Heterogeneous thickening of the endometrium with endometrial fluid present. Focal area of increased endometrial flow on color flow Doppler in the left anterior frontal region. Right ovary Measurements: 2.2 x 1.1 x 1.5 cm = volume: 2 mL. Normal appearance/no adnexal mass. Left ovary Measurements: 2.9 x 1.6 x 1.7 cm = volume: 4 mL. Normal appearance/no adnexal mass. Pulsed Doppler evaluation of both ovaries demonstrates normal low-resistance arterial and venous waveforms. Other findings No abnormal free fluid. IMPRESSION: 1. Normal ultrasound appearance of the uterus and ovaries. 2. Heterogeneous endometrial thickening with endometrial stripe thickness of 2.5 cm. Focal area of increased color flow in the left anterior frontal region could indicate an area of acute hemorrhage. Electronically Signed   By: Lucienne Capers M.D.   On: 09/02/2022 01:35     PROCEDURES:  Critical Care performed: No     .1-3 Lead EKG Interpretation  Performed by: Korby Ratay, Delice Bison, DO Authorized by: Velera Lansdale, Delice Bison, DO     Interpretation: normal     ECG rate:  91   ECG rate assessment: normal     Rhythm: sinus rhythm     Ectopy: none     Conduction: normal       IMPRESSION / MDM / ASSESSMENT AND PLAN / ED COURSE  I reviewed the triage vital signs and the nursing notes.    Patient postop day 7 after polypectomy, hysteroscopy by Dr. Matthew Saras in [place] with increased bleeding today.  Hemodynamically stable.  The patient is on the cardiac monitor to evaluate for evidence of arrhythmia and/or significant heart rate changes.   DIFFERENTIAL DIAGNOSIS (includes but not limited to):   Symptomatic anemia, postoperative bleeding, less likely postoperative infection, endometritis   Patient's presentation is most consistent with acute presentation with potential threat to life or bodily  function.   PLAN: Patient had labs obtained from triage.  No leukocytosis.  Hemoglobin is 13.  Normal platelets.  Minimally elevated creatinine of 1.06 but otherwise normal electrolytes, LFTs.  Abdominal exam here is benign.  hCG negative.  We will proceed with transvaginal ultrasound, perform pelvic exam and repeat H&H.  She remains hemodynamically stable here but will  monitor closely.  We will keep her n.p.o. and anticipate discussion with her OB/GYN on-call.   MEDICATIONS GIVEN IN ED: Medications  ketorolac (TORADOL) 30 MG/ML injection 30 mg (30 mg Intravenous Given 09/02/22 0146)  sodium chloride 0.9 % bolus 1,000 mL (1,000 mLs Intravenous New Bag/Given 09/02/22 0158)     ED COURSE: Patient's ultrasound reviewed and interpreted by myself and the radiologist and shows a thickened endometrial stripe and sign of active bleeding within the left frontal area of the uterus.  She has moderate amount of vaginal bleeding on exam but no signs of hemorrhaging.  She is seeing small clots.  She has dropped her blood pressure slightly to 99/78 but is a petite patient.  Her heart rate is 66.  We will give a liter of IV fluids and continue to monitor.  Will discuss with OB/GYN on-call.   2:26 AM  Pt's repeat hemoglobin is stable.  Blood pressure has improved.  No hemorrhaging here in the ED.  Abdominal exam is benign.  OB/GYN on-call comfortable with plan for discharge home after reviewing her chart and imaging and recommends follow-up with Dr. Matthew Saras in the morning.  Will discharge home with bleeding return precautions and close outpatient follow-up.  Patient comfortable with this plan.    At this time, I do not feel there is any life-threatening condition present. I reviewed all nursing notes, vitals, pertinent previous records.  All lab and urine results, EKGs, imaging ordered have been independently reviewed and interpreted by myself.  I reviewed all available radiology reports from any imaging ordered  this visit.  Based on my assessment, I feel the patient is safe to be discharged home without further emergent workup and can continue workup as an outpatient as needed. Discussed all findings, treatment plan as well as usual and customary return precautions.  They verbalize understanding and are comfortable with this plan.  Outpatient follow-up has been provided as needed.  All questions have been answered.   CONSULTS:  1:55 AM  Spoke with Dr. Bridgett Larsson on call for [facility].  Appreciate her help.  She has reviewed patient's chart/imaging.  She states these findings are all expected after a Myoview and would not recommend any emergent intervention in the ED at this time but would recommend follow up with Dr. Matthew Saras in the AM.   OUTSIDE RECORDS REVIEWED: Reviewed notes from Dr. Matthew Saras on 08/26/2022.       FINAL CLINICAL IMPRESSION(S) / ED DIAGNOSES   Final diagnoses:  Vaginal bleeding     Rx / DC Orders   ED Discharge Orders     None        Note:  This document was prepared using Dragon voice recognition software and may include unintentional dictation errors.   Kyron Schlitt, Delice Bison, DO 09/02/22 [phone]

## 2022-09-07 DIAGNOSIS — N939 Abnormal uterine and vaginal bleeding, unspecified: Secondary | ICD-10-CM | POA: Diagnosis not present

## 2022-09-07 DIAGNOSIS — R9389 Abnormal findings on diagnostic imaging of other specified body structures: Secondary | ICD-10-CM | POA: Diagnosis not present

## 2022-09-08 DIAGNOSIS — L570 Actinic keratosis: Secondary | ICD-10-CM | POA: Diagnosis not present

## 2022-09-08 DIAGNOSIS — D225 Melanocytic nevi of trunk: Secondary | ICD-10-CM | POA: Diagnosis not present

## 2022-09-08 DIAGNOSIS — D2230 Melanocytic nevi of unspecified part of face: Secondary | ICD-10-CM | POA: Diagnosis not present

## 2022-09-08 DIAGNOSIS — D224 Melanocytic nevi of scalp and neck: Secondary | ICD-10-CM | POA: Diagnosis not present

## 2022-09-08 DIAGNOSIS — B079 Viral wart, unspecified: Secondary | ICD-10-CM | POA: Diagnosis not present

## 2022-09-14 DIAGNOSIS — N939 Abnormal uterine and vaginal bleeding, unspecified: Secondary | ICD-10-CM | POA: Diagnosis not present

## 2022-09-14 DIAGNOSIS — D509 Iron deficiency anemia, unspecified: Secondary | ICD-10-CM | POA: Diagnosis not present

## 2022-11-12 DIAGNOSIS — N939 Abnormal uterine and vaginal bleeding, unspecified: Secondary | ICD-10-CM | POA: Diagnosis not present

## 2022-11-12 DIAGNOSIS — N840 Polyp of corpus uteri: Secondary | ICD-10-CM | POA: Diagnosis not present

## 2023-02-22 DIAGNOSIS — Z419 Encounter for procedure for purposes other than remedying health state, unspecified: Secondary | ICD-10-CM | POA: Diagnosis not present

## 2023-03-24 DIAGNOSIS — Z419 Encounter for procedure for purposes other than remedying health state, unspecified: Secondary | ICD-10-CM | POA: Diagnosis not present

## 2023-04-24 DIAGNOSIS — Z419 Encounter for procedure for purposes other than remedying health state, unspecified: Secondary | ICD-10-CM | POA: Diagnosis not present

## 2023-05-24 DIAGNOSIS — Z419 Encounter for procedure for purposes other than remedying health state, unspecified: Secondary | ICD-10-CM | POA: Diagnosis not present

## 2023-06-24 DIAGNOSIS — Z419 Encounter for procedure for purposes other than remedying health state, unspecified: Secondary | ICD-10-CM | POA: Diagnosis not present

## 2023-07-25 DIAGNOSIS — Z419 Encounter for procedure for purposes other than remedying health state, unspecified: Secondary | ICD-10-CM | POA: Diagnosis not present

## 2023-08-24 DIAGNOSIS — Z419 Encounter for procedure for purposes other than remedying health state, unspecified: Secondary | ICD-10-CM | POA: Diagnosis not present

## 2023-09-24 DIAGNOSIS — Z419 Encounter for procedure for purposes other than remedying health state, unspecified: Secondary | ICD-10-CM | POA: Diagnosis not present

## 2023-10-24 DIAGNOSIS — Z419 Encounter for procedure for purposes other than remedying health state, unspecified: Secondary | ICD-10-CM | POA: Diagnosis not present

## 2023-11-24 DIAGNOSIS — Z419 Encounter for procedure for purposes other than remedying health state, unspecified: Secondary | ICD-10-CM | POA: Diagnosis not present

## 2023-12-25 DIAGNOSIS — Z419 Encounter for procedure for purposes other than remedying health state, unspecified: Secondary | ICD-10-CM | POA: Diagnosis not present

## 2024-01-22 DIAGNOSIS — Z419 Encounter for procedure for purposes other than remedying health state, unspecified: Secondary | ICD-10-CM | POA: Diagnosis not present

## 2024-03-04 DIAGNOSIS — Z419 Encounter for procedure for purposes other than remedying health state, unspecified: Secondary | ICD-10-CM | POA: Diagnosis not present

## 2024-04-03 DIAGNOSIS — Z419 Encounter for procedure for purposes other than remedying health state, unspecified: Secondary | ICD-10-CM | POA: Diagnosis not present

## 2024-05-04 DIAGNOSIS — Z419 Encounter for procedure for purposes other than remedying health state, unspecified: Secondary | ICD-10-CM | POA: Diagnosis not present

## 2024-06-03 DIAGNOSIS — Z419 Encounter for procedure for purposes other than remedying health state, unspecified: Secondary | ICD-10-CM | POA: Diagnosis not present

## 2024-07-04 DIAGNOSIS — Z419 Encounter for procedure for purposes other than remedying health state, unspecified: Secondary | ICD-10-CM | POA: Diagnosis not present

## 2024-08-04 DIAGNOSIS — Z419 Encounter for procedure for purposes other than remedying health state, unspecified: Secondary | ICD-10-CM | POA: Diagnosis not present

## 2024-09-03 DIAGNOSIS — Z419 Encounter for procedure for purposes other than remedying health state, unspecified: Secondary | ICD-10-CM | POA: Diagnosis not present
